# Patient Record
Sex: Female | Born: 1955 | Race: White | Hispanic: No | Marital: Married | Smoking: Former smoker
Health system: Southern US, Community
[De-identification: ages and names within clinical notes are randomized; demographics above are authoritative.]

## PROBLEM LIST (undated history)

## (undated) DIAGNOSIS — K219 Gastro-esophageal reflux disease without esophagitis: Secondary | ICD-10-CM

## (undated) DIAGNOSIS — I998 Other disorder of circulatory system: Secondary | ICD-10-CM

## (undated) DIAGNOSIS — I447 Left bundle-branch block, unspecified: Secondary | ICD-10-CM

## (undated) DIAGNOSIS — I429 Cardiomyopathy, unspecified: Secondary | ICD-10-CM

## (undated) DIAGNOSIS — I2510 Atherosclerotic heart disease of native coronary artery without angina pectoris: Secondary | ICD-10-CM

## (undated) DIAGNOSIS — I10 Essential (primary) hypertension: Secondary | ICD-10-CM

## (undated) DIAGNOSIS — E785 Hyperlipidemia, unspecified: Secondary | ICD-10-CM

## (undated) HISTORY — DX: Essential (primary) hypertension: I10

## (undated) HISTORY — DX: Atherosclerotic heart disease of native coronary artery without angina pectoris: I25.10

## (undated) HISTORY — DX: Other disorder of circulatory system: I99.8

## (undated) HISTORY — DX: Left bundle-branch block, unspecified: I44.7

## (undated) HISTORY — DX: Cardiomyopathy, unspecified: I42.9

## (undated) HISTORY — DX: Gastro-esophageal reflux disease without esophagitis: K21.9

## (undated) HISTORY — DX: Hyperlipidemia, unspecified: E78.5

---

## 1986-05-15 HISTORY — PX: TUBAL LIGATION: SHX77

## 2006-05-15 HISTORY — PX: FINGER SURGERY: SHX640

## 2008-05-15 HISTORY — PX: CERVICAL POLYPECTOMY: SHX88

## 2012-05-15 HISTORY — PX: SHOULDER SURGERY: SHX246

## 2013-09-19 ENCOUNTER — Ambulatory Visit: Payer: Self-pay | Admitting: Family Medicine

## 2013-10-02 ENCOUNTER — Encounter (INDEPENDENT_AMBULATORY_CARE_PROVIDER_SITE_OTHER): Payer: Self-pay

## 2013-10-02 ENCOUNTER — Ambulatory Visit (INDEPENDENT_AMBULATORY_CARE_PROVIDER_SITE_OTHER): Payer: 59 | Admitting: Family Medicine

## 2013-10-02 ENCOUNTER — Encounter: Payer: Self-pay | Admitting: Family Medicine

## 2013-10-02 VITALS — BP 138/82 | HR 74 | Temp 98.0°F | Ht 64.75 in | Wt 199.8 lb

## 2013-10-02 DIAGNOSIS — E785 Hyperlipidemia, unspecified: Secondary | ICD-10-CM

## 2013-10-02 DIAGNOSIS — I2510 Atherosclerotic heart disease of native coronary artery without angina pectoris: Secondary | ICD-10-CM

## 2013-10-02 DIAGNOSIS — I10 Essential (primary) hypertension: Secondary | ICD-10-CM

## 2013-10-02 NOTE — Progress Notes (Signed)
   Subjective:    Patient ID: Veronica Gomez, female    DOB: 06-28-1955, 58 y.o.   MRN: [id number]  HPI This 58 y.o. female presents for evaluation of establishment visit.  She has moved to  [place] from [place]. She has CAD and has been seeing cardiology.  She has no coronary stents And she has hx of LBBB.  She has hx of GERD, hypertension, and hyperlipidemia.  She states she has been told by her cardiologist that she had a small AMI.   Marland Kitchen   Review of Systems C/o allergies No chest pain, SOB, HA, dizziness, vision change, N/V, diarrhea, constipation, dysuria, urinary urgency or frequency, myalgias, arthralgias or rash.     Objective:   Physical Exam Vital signs noted  Well developed well nourished female.  HEENT - Head atraumatic Normocephalic                Eyes - PERRLA, Conjuctiva - clear Sclera- Clear EOMI                Ears - EAC's Wnl TM's Wnl Gross Hearing WNL                Throat - oropharanx wnl Respiratory - Lungs CTA bilateral Cardiac - RRR S1 and S2 without murmur GI - Abdomen soft Nontender and bowel sounds active x 4 Extremities - No edema. Neuro - Grossly intact.       Assessment & Plan:  Other and unspecified hyperlipidemia - Plan: POCT CBC, CMP14+EGFR, Lipid panel, Thyroid Panel With TSH, Vit D  25 hydroxy (rtn osteoporosis monitoring)  CAD (coronary artery disease) - Plan: Ambulatory referral to Cardiology  Essential hypertension, benign - Plan: POCT CBC, CMP14+EGFR  Allergies - stay away from decongestants and take zyrtec [place] FNP

## 2013-10-03 ENCOUNTER — Other Ambulatory Visit (INDEPENDENT_AMBULATORY_CARE_PROVIDER_SITE_OTHER): Payer: 59

## 2013-10-03 DIAGNOSIS — I10 Essential (primary) hypertension: Secondary | ICD-10-CM

## 2013-10-03 DIAGNOSIS — E785 Hyperlipidemia, unspecified: Secondary | ICD-10-CM

## 2013-10-03 LAB — POCT CBC
Granulocyte percent: 2.4 %G — AB (ref 37–80)
HCT, POC: 42.2 % (ref 37.7–47.9)
Hemoglobin: 14 g/dL (ref 12.2–16.2)
Lymph, poc: 1.5 (ref 0.6–3.4)
MCH, POC: 32.7 pg — AB (ref 27–31.2)
MCHC: 33.2 g/dL (ref 31.8–35.4)
MCV: 98.4 fL — AB (ref 80–97)
MPV: 8.2 fL (ref 0–99.8)
POC LYMPH PERCENT: 37.8 %L (ref 10–50)
Platelet Count, POC: 231 10*3/uL (ref 142–424)
RBC: 4.3 M/uL (ref 4.04–5.48)
RDW, POC: 12.1 %
WBC: 4.1 10*3/uL — AB (ref 4.6–10.2)

## 2013-10-03 NOTE — Progress Notes (Signed)
Pt came in for lab  only 

## 2013-10-04 LAB — CMP14+EGFR
ALT: 13 IU/L (ref 0–32)
AST: 15 IU/L (ref 0–40)
Albumin/Globulin Ratio: 1.9 (ref 1.1–2.5)
Albumin: 4.3 g/dL (ref 3.6–4.8)
Alkaline Phosphatase: 55 IU/L (ref 39–117)
BUN/Creatinine Ratio: 15 (ref 11–26)
BUN: 16 mg/dL (ref 8–27)
CO2: 23 mmol/L (ref 18–29)
Calcium: 9.1 mg/dL (ref 8.7–10.3)
Chloride: 100 mmol/L (ref 97–108)
Creatinine, Ser: 1.04 mg/dL — ABNORMAL HIGH (ref 0.57–1.00)
GFR calc Af Amer: 67 mL/min/{1.73_m2} (ref >59)
GFR calc non Af Amer: 59 mL/min/{1.73_m2} — ABNORMAL LOW (ref >59)
Globulin, Total: 2.3 g/dL (ref 1.5–4.5)
Glucose: 97 mg/dL (ref 65–99)
Potassium: 4.1 mmol/L (ref 3.5–5.2)
Sodium: 140 mmol/L (ref 134–144)
Total Bilirubin: 0.4 mg/dL (ref 0.0–1.2)
Total Protein: 6.6 g/dL (ref 6.0–8.5)

## 2013-10-04 LAB — THYROID PANEL WITH TSH
Free Thyroxine Index: 2.2 (ref 1.2–4.9)
T3 Uptake Ratio: 29 % (ref 24–39)
T4, Total: 7.7 ug/dL (ref 4.5–12.0)
TSH: 1.88 u[IU]/mL (ref 0.450–4.500)

## 2013-10-04 LAB — LIPID PANEL
Chol/HDL Ratio: 3.4 ratio units (ref 0.0–4.4)
Cholesterol, Total: 162 mg/dL (ref 100–199)
HDL: 48 mg/dL (ref >39)
LDL Calculated: 99 mg/dL (ref 0–99)
Triglycerides: 77 mg/dL (ref 0–149)
VLDL Cholesterol Cal: 15 mg/dL (ref 5–40)

## 2013-10-04 LAB — VITAMIN D 25 HYDROXY (VIT D DEFICIENCY, FRACTURES): Vit D, 25-Hydroxy: 28.4 ng/mL — ABNORMAL LOW (ref 30.0–100.0)

## 2013-10-07 ENCOUNTER — Other Ambulatory Visit: Payer: Self-pay | Admitting: Family Medicine

## 2013-10-07 MED ORDER — VITAMIN D (ERGOCALCIFEROL) 1.25 MG (50000 UNIT) PO CAPS: 50000.0000 [IU] | ORAL_CAPSULE | ORAL | Status: DC

## 2013-10-08 ENCOUNTER — Telehealth: Payer: Self-pay | Admitting: Family Medicine

## 2013-10-08 NOTE — Telephone Encounter (Signed)
Message copied by Azalee Course on Wed Oct 08, 2013 11:02 AM ------      Message from: Deatra Canter      Created: Tue Oct 07, 2013 11:42 AM       Vit D is low and rx will be sent to pharm ------

## 2013-10-16 ENCOUNTER — Encounter: Payer: Self-pay | Admitting: Family Medicine

## 2013-11-20 ENCOUNTER — Encounter: Payer: Self-pay | Admitting: Internal Medicine

## 2013-11-20 ENCOUNTER — Ambulatory Visit (INDEPENDENT_AMBULATORY_CARE_PROVIDER_SITE_OTHER): Payer: 59 | Admitting: Internal Medicine

## 2013-11-20 VITALS — BP 138/90 | HR 80 | Ht 64.0 in | Wt 194.1 lb

## 2013-11-20 DIAGNOSIS — I10 Essential (primary) hypertension: Secondary | ICD-10-CM | POA: Insufficient documentation

## 2013-11-20 DIAGNOSIS — I2589 Other forms of chronic ischemic heart disease: Secondary | ICD-10-CM

## 2013-11-20 DIAGNOSIS — I447 Left bundle-branch block, unspecified: Secondary | ICD-10-CM | POA: Insufficient documentation

## 2013-11-20 DIAGNOSIS — E785 Hyperlipidemia, unspecified: Secondary | ICD-10-CM | POA: Insufficient documentation

## 2013-11-20 DIAGNOSIS — I255 Ischemic cardiomyopathy: Secondary | ICD-10-CM

## 2013-11-20 MED ORDER — CARVEDILOL 3.125 MG PO TABS: 3.1250 mg | ORAL_TABLET | Freq: Two times a day (BID) | ORAL | Status: DC

## 2013-11-20 NOTE — Patient Instructions (Signed)
Your physician wants you to follow-up in:  6 months. You will receive a reminder letter in the mail two months in advance. If you don't receive a letter, please call our office to schedule the follow-up appointment.   

## 2013-11-20 NOTE — Progress Notes (Signed)
OFFICE NOTE  Chief Complaint:  Establish new cardiologist  Primary Care Physician: Deatra Canter, FNP  HPI:  Veronica Gomez is a pleasant 58 year old female who moved to [place] in January of this year. Her past echo history is significant for hypertension, dyslipidemia and a former smoker, but quit over 25 years ago. 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 In 2009 she was noted to have an interventricular conduction delay, and was referred for stress test which was negative for ischemia. Records indicate 2013 she was having some chest discomfort and had a new, clear left bundle branch block. She underwent a nuclear stress test shows a fixed inferoapical defect. EF was 56%. An echocardiogram performed around the same time showed an EF of 40% with apical inferior wall hypokinesis. This was determined to be scar, no further workup was recommended as her symptoms were not thought to be due to ischemia. No ischemia was noted on her stress test. She is on appropriate medical therapy including aspirin, statin, ACE inhibitor, beta blocker and diuretic. She is quite active around her house and into your and denies any chest pain or worsening shortness of breath. Her weight has been stable. Her last office visit with her cardiologist was in December 2014 no changes to her medical regimen were recommended at that time. She did have a recent lipid profile which showed total cholesterol 162 and LDL of 99.  PMHx:  Past Medical History  Diagnosis Date  . Hypertension   . Hyperlipidemia   . GERD (gastroesophageal reflux disease)     Past Surgical History  Procedure Laterality Date  . Shoulder surgery Right   . Uterine fibroid surgery    . Tubal ligation      FAMHx:  Family History  Problem Relation Age of Onset  . Osteoporosis Mother   . Asthma Mother   . Hypertension Mother   . Hypertension Father   . Heart disease Father     MI    SOCHx:   reports that she quit smoking about 25 years ago. She has never used smokeless tobacco. She reports that she drinks alcohol. She reports that she does not use illicit drugs.  ALLERGIES:  No Known Allergies  ROS: A comprehensive review of systems was negative except for: Musculoskeletal: positive for arthralgias  HOME MEDS: Current Outpatient Prescriptions  Medication Sig Dispense Refill  . aspirin 81 MG tablet Take 81 mg by mouth daily.      Marland Kitchen atorvastatin (LIPITOR) 20 MG tablet Take 20 mg by mouth daily.      . carvedilol (COREG) 3.125 MG tablet Take 3.125 mg by mouth 2 (two) times daily with a meal.      . hydrochlorothiazide (HYDRODIURIL) 25 MG tablet Take 25 mg by mouth daily.      Marland Kitchen lisinopril (PRINIVIL,ZESTRIL) 20 MG tablet Take 20 mg by mouth daily.      . Omega-3 Fatty Acids (FISH OIL) 1200 MG CAPS Take 1 capsule by mouth daily.      Marland Kitchen omeprazole (PRILOSEC) 20 MG capsule Take 20 mg by mouth daily.      . Vitamin D, Ergocalciferol, (DRISDOL) 50000 UNITS CAPS capsule Take 1 capsule (50,000 Units total) by mouth every 7 (seven) days.  18 capsule  0   No current facility-administered medications for this visit.    LABS/IMAGING: No results found for this or any previous visit (from the past 48 hour(s)). No results  found.  VITALS: BP 138/90  Pulse 80  Ht 5\' 4"  (1.626 m)  Wt 194 lb 1.6 oz (88.043 kg)  BMI 33.30 kg/m2  EXAM: General appearance: alert and no distress Neck: no carotid bruit and no JVD Lungs: clear to auscultation bilaterally Heart: regular rate and rhythm, S1, S2 normal, no murmur, click, rub or gallop Abdomen: soft, non-tender; bowel sounds normal; no masses,  no organomegaly Extremities: extremities normal, atraumatic, no cyanosis or edema Pulses: 2+ and symmetric Skin: Skin color, texture, turgor normal. No rashes or lesions Neurologic: Grossly normal Psych: Mood, affect normal  EKG: Normal sinus rhythm at 80, left axis deviation and left bundle branch  block, QRS duration 160 msec  ASSESSMENT: 1. Ischemic cardiomyopathy, EF between 40 and 50% 2. Fixed inferoapical defect suggestive of scar on prior nuclear stress testing 3. Chronic LBBB 4. Hypertension 5. Dyslipidemia 6. Former smoker  PLAN: 1.   Mrs. Veronica Gomez is here to establish cardiac care. It seems that she is developed a left bundle branch block probably due to ischemia and had a prior MI in the inferoapical area. She does not recall symptoms of this event. Her nuclear stress test was nonischemic and therefore she has never undergone cardiac catheterization. She continues to be asymptomatic and denies chest pain or shortness of breath. She is on appropriate medical therapy. Her hypertension is fairly well controlled. Her cholesterol is the same however LDL could be better below 70. There may be room to increase her Lipitor in the future. I recommend she continue work on activity, diet and exercise. Plan to see her back in 6 months.  Thank you for the kind referral.  Chrystie NoseKenneth C. Hazelle Woollard, MD, [facility] Attending Cardiologist [facility]  Khaliq Turay C 11/20/2013, 3:08 PM

## 2014-02-03 ENCOUNTER — Encounter: Payer: Self-pay | Admitting: Family Medicine

## 2014-02-03 ENCOUNTER — Ambulatory Visit (INDEPENDENT_AMBULATORY_CARE_PROVIDER_SITE_OTHER): Payer: 59 | Admitting: Family Medicine

## 2014-02-03 VITALS — BP 140/86 | HR 65 | Temp 97.6°F | Ht 64.75 in | Wt 195.6 lb

## 2014-02-03 DIAGNOSIS — I10 Essential (primary) hypertension: Secondary | ICD-10-CM

## 2014-02-03 DIAGNOSIS — E559 Vitamin D deficiency, unspecified: Secondary | ICD-10-CM

## 2014-02-03 DIAGNOSIS — R5383 Other fatigue: Secondary | ICD-10-CM

## 2014-02-03 DIAGNOSIS — E785 Hyperlipidemia, unspecified: Secondary | ICD-10-CM

## 2014-02-03 DIAGNOSIS — R5381 Other malaise: Secondary | ICD-10-CM

## 2014-02-03 NOTE — Progress Notes (Signed)
   Subjective:    Patient ID: Veronica Gomez, female    DOB: [birth year], 58 y.o.   MRN: [id number]  HPI  Patient is here for follow up.  She has hx of HTN, CAD, and hyperlipidemia.  She was seen 6/15 and her labs were ok. She is seeing cardiology.  She has problems with arthritis.  She denies any acute c/o.  Review of Systems    No chest pain, SOB, HA, dizziness, vision change, N/V, diarrhea, constipation, dysuria, urinary urgency or frequency, myalgias, arthralgias or rash.  Objective:   Physical Exam Vital signs noted  Well developed well nourished female.  HEENT - Head atraumatic Normocephalic                Eyes - PERRLA, Conjuctiva - clear Sclera- Clear EOMI                Ears - EAC's Wnl TM's Wnl Gross Hearing WNL                Nose - Nares patent                 Throat - oropharanx wnl Respiratory - Lungs CTA bilateral Cardiac - RRR S1 and S2 without murmur GI - Abdomen soft Nontender and bowel sounds active x 4 Extremities - No edema. Neuro - Grossly intact.       Assessment & Plan:  Hyperlipemia - Plan: CANCELED: CMP14+EGFR, CANCELED: Lipid panel  Essential hypertension, benign - Plan: CANCELED: CMP14+EGFR  Vitamin D deficiency - Plan: CANCELED: Vit D  25 hydroxy (rtn osteoporosis monitoring)  Other fatigue - Plan: CANCELED: POCT CBC  Arthritis - mobic $Remov'15mg'[id number]$  one po qd  Check labs next visit in 6 months

## 2014-04-07 ENCOUNTER — Encounter: Payer: Self-pay | Admitting: *Deleted

## 2014-04-15 ENCOUNTER — Other Ambulatory Visit: Payer: Self-pay | Admitting: Family Medicine

## 2014-04-15 ENCOUNTER — Telehealth: Payer: Self-pay | Admitting: Family Medicine

## 2014-04-15 MED ORDER — HYDROCHLOROTHIAZIDE 25 MG PO TABS: 25.0000 mg | ORAL_TABLET | Freq: Every day | ORAL | Status: DC

## 2014-04-15 NOTE — Telephone Encounter (Signed)
hctz sent to pharmacy

## 2014-04-15 NOTE — Telephone Encounter (Signed)
Pt wants refills on HCTZ 25 mg but I can't see that we have ever prescribed it for her. Is it ok to send refills to pharmacy? Please advise.

## 2014-04-16 NOTE — Telephone Encounter (Signed)
Pt aware.

## 2014-05-13 ENCOUNTER — Other Ambulatory Visit: Payer: Self-pay

## 2014-05-13 MED ORDER — ATORVASTATIN CALCIUM 20 MG PO TABS: 20.0000 mg | ORAL_TABLET | Freq: Every day | ORAL | Status: DC

## 2014-05-25 ENCOUNTER — Telehealth: Payer: Self-pay | Admitting: Internal Medicine

## 2014-05-25 ENCOUNTER — Telehealth: Payer: Self-pay | Admitting: Cardiology

## 2014-05-25 NOTE — Telephone Encounter (Signed)
Wrong provider ° °Close encounter °

## 2014-05-26 NOTE — Telephone Encounter (Signed)
Close encounter 

## 2014-06-09 ENCOUNTER — Ambulatory Visit: Payer: 59 | Admitting: Internal Medicine

## 2014-06-18 ENCOUNTER — Ambulatory Visit (INDEPENDENT_AMBULATORY_CARE_PROVIDER_SITE_OTHER): Payer: 59 | Admitting: Internal Medicine

## 2014-06-18 ENCOUNTER — Encounter: Payer: Self-pay | Admitting: Internal Medicine

## 2014-06-18 VITALS — BP 136/88 | HR 80 | Ht 65.0 in | Wt 198.7 lb

## 2014-06-18 DIAGNOSIS — I255 Ischemic cardiomyopathy: Secondary | ICD-10-CM

## 2014-06-18 DIAGNOSIS — I10 Essential (primary) hypertension: Secondary | ICD-10-CM

## 2014-06-18 DIAGNOSIS — I447 Left bundle-branch block, unspecified: Secondary | ICD-10-CM

## 2014-06-18 DIAGNOSIS — E785 Hyperlipidemia, unspecified: Secondary | ICD-10-CM

## 2014-06-18 MED ORDER — EZETIMIBE 10 MG PO TABS: 10.0000 mg | ORAL_TABLET | Freq: Every day | ORAL | Status: DC

## 2014-06-18 NOTE — Progress Notes (Signed)
OFFICE NOTE  Chief Complaint:  Routine follow-up  Primary Care Physician: Frederica KusterMILLER, STEPHEN M, MD  HPI:  Veronica Gomez is a pleasant 59 year old female who moved to [place] in January of this year. Her past echo history is significant for hypertension, dyslipidemia and a former smoker, but quit over 25 years ago. 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 In 2009 she was noted to have an interventricular conduction delay, and was referred for stress test which was negative for ischemia. Records indicate 2013 she was having some chest discomfort and had a new, clear left bundle branch block. She underwent a nuclear stress test shows a fixed inferoapical defect. EF was 56%. An echocardiogram performed around the same time showed an EF of 40% with apical inferior wall hypokinesis. This was determined to be scar, no further workup was recommended as her symptoms were not thought to be due to ischemia. No ischemia was noted on her stress test. She is on appropriate medical therapy including aspirin, statin, ACE inhibitor, beta blocker and diuretic. She is quite active around her house and into your and denies any chest pain or worsening shortness of breath. Her weight has been stable. Her last office visit with her cardiologist was in December 2014 no changes to her medical regimen were recommended at that time. She did have a recent lipid profile which showed total cholesterol 162 and LDL of 99.  I saw [name] back in the office today. She has no new complaints. We recently did lipid testing which shows her LDL cholesterol to be 99. That is still higher than her goal LDL of less than 70. We talked about possibly adjusting her Lipitor up however she reported she had side effects with that in the past.  PMHx:  Past Medical History  Diagnosis Date  . Hypertension   . Hyperlipidemia   . GERD (gastroesophageal reflux disease)     Past Surgical History    Procedure Laterality Date  . Shoulder surgery Right   . Uterine fibroid surgery    . Tubal ligation      FAMHx:  Family History  Problem Relation Age of Onset  . Osteoporosis Mother   . Asthma Mother   . Hypertension Mother   . Hypertension Father   . Heart disease Father     MI    SOCHx:   reports that she quit smoking about 26 years ago. She has never used smokeless tobacco. She reports that she drinks alcohol. She reports that she does not use illicit drugs.  ALLERGIES:  No Known Allergies  ROS: A comprehensive review of systems was negative except for: Musculoskeletal: positive for arthralgias  HOME MEDS: Current Outpatient Prescriptions  Medication Sig Dispense Refill  . aspirin 81 MG tablet Take 81 mg by mouth daily.    Marland Kitchen. atorvastatin (LIPITOR) 20 MG tablet Take 1 tablet (20 mg total) by mouth daily. 90 tablet 0  . carvedilol (COREG) 3.125 MG tablet Take 1 tablet (3.125 mg total) by mouth 2 (two) times daily with a meal. 60 tablet 6  . hydrochlorothiazide (HYDRODIURIL) 25 MG tablet Take 1 tablet (25 mg total) by mouth daily. 90 tablet 3  . lisinopril (PRINIVIL,ZESTRIL) 20 MG tablet Take 20 mg by mouth daily.    Marland Kitchen. omeprazole (PRILOSEC) 20 MG capsule Take 20 mg by mouth daily.    Marland Kitchen. ezetimibe (ZETIA) 10 MG tablet Take 1 tablet (10 mg total) by mouth daily.  21 tablet 0   No current facility-administered medications for this visit.    LABS/IMAGING: No results found for this or any previous visit (from the past 48 hour(s)). No results found.  VITALS: BP 136/88 mmHg  Pulse 80  Ht  (1.651 m)  Wt 198 lb 11.2 oz (90.13 kg)  BMI 33.07 kg/m2  EXAM: General appearance: alert and no distress Neck: no carotid bruit and no JVD Lungs: clear to auscultation bilaterally Heart: regular rate and rhythm, S1, S2 normal, no murmur, click, rub or gallop Abdomen: soft, non-tender; bowel sounds normal; no masses,  no organomegaly Extremities: extremities normal, atraumatic,  no cyanosis or edema Pulses: 2+ and symmetric Skin: Skin color, texture, turgor normal. No rashes or lesions Neurologic: Grossly normal Psych: Mood, affect normal  EKG: Normal sinus rhythm at 80, left axis deviation and left bundle branch block, QRS duration 158 msec  ASSESSMENT: 1. Ischemic cardiomyopathy, EF between 40 and 50% 2. Fixed inferoapical defect suggestive of scar on prior nuclear stress testing 3. Chronic LBBB 4. Hypertension 5. Dyslipidemia 6. Former smoker  PLAN: 1.   Veronica Gomez is continuing to do well. Of encouraged exercise and work on weight loss. Blood pressure is well controlled. Cholesterol could be improved. I recommend rechecking a lipid profile and if her LDL remains greater than 70, starting her on Zetia 10 milligrams daily in addition to her Lipitor. Hopefully this will help [place] avoid the myalgias she had with higher dose Lipitor. Plan to see her back in 6 months.  Chrystie Nose, MD, [facility] Attending Cardiologist [facility]  HILTY,Kenneth C 06/18/2014, 5:32 PM

## 2014-06-18 NOTE — Patient Instructions (Signed)
Your physician recommends that you return for lab work FASTING  We will call you with the results - Dr. Rennis GoldenHilty may want to start you on zetia   Your physician wants you to follow-up in: 6 months with Dr. Rennis GoldenHilty. You will receive a reminder letter in the mail two months in advance. If you don't receive a letter, please call our office to schedule the follow-up appointment.

## 2014-06-22 ENCOUNTER — Other Ambulatory Visit (INDEPENDENT_AMBULATORY_CARE_PROVIDER_SITE_OTHER): Payer: 59

## 2014-06-22 DIAGNOSIS — E785 Hyperlipidemia, unspecified: Secondary | ICD-10-CM

## 2014-06-22 NOTE — Progress Notes (Signed)
Lab only for Dr Chrystie NoseKenneth C Hilty

## 2014-06-23 LAB — LIPID PANEL
CHOL/HDL RATIO: 2.9 ratio (ref 0.0–4.4)
Cholesterol, Total: 149 mg/dL (ref 100–199)
HDL: 51 mg/dL (ref >39)
LDL CALC: 86 mg/dL (ref 0–99)
Triglycerides: 62 mg/dL (ref 0–149)
VLDL Cholesterol Cal: 12 mg/dL (ref 5–40)

## 2014-06-26 ENCOUNTER — Encounter: Payer: Self-pay | Admitting: *Deleted

## 2014-06-30 ENCOUNTER — Other Ambulatory Visit: Payer: Self-pay | Admitting: *Deleted

## 2014-06-30 MED ORDER — LISINOPRIL 20 MG PO TABS: 20.0000 mg | ORAL_TABLET | Freq: Every day | ORAL | Status: DC

## 2014-08-04 ENCOUNTER — Ambulatory Visit: Payer: 59 | Admitting: Family Medicine

## 2014-08-05 ENCOUNTER — Ambulatory Visit (INDEPENDENT_AMBULATORY_CARE_PROVIDER_SITE_OTHER): Payer: 59 | Admitting: Family Medicine

## 2014-08-05 ENCOUNTER — Encounter: Payer: Self-pay | Admitting: Family Medicine

## 2014-08-05 VITALS — BP 132/90 | HR 71 | Temp 97.0°F | Ht 65.0 in | Wt 194.0 lb

## 2014-08-05 DIAGNOSIS — E785 Hyperlipidemia, unspecified: Secondary | ICD-10-CM

## 2014-08-05 DIAGNOSIS — I10 Essential (primary) hypertension: Secondary | ICD-10-CM

## 2014-08-05 MED ORDER — ATORVASTATIN CALCIUM 20 MG PO TABS: 40.0000 mg | ORAL_TABLET | ORAL | Status: DC

## 2014-08-05 NOTE — Patient Instructions (Signed)
Check on price of Tdap with insurance company  Hypertension Hypertension, commonly called high blood pressure, is when the force of blood pumping through your arteries is too strong. Your arteries are the blood vessels that carry blood from your heart throughout your body. A blood pressure reading consists of a higher number over a lower number, such as 110/72. The higher number (systolic) is the pressure inside your arteries when your heart pumps. The lower number (diastolic) is the pressure inside your arteries when your heart relaxes. Ideally you want your blood pressure below 120/80. Hypertension forces your heart to work harder to pump blood. Your arteries may become narrow or stiff. Having hypertension puts you at risk for heart disease, stroke, and other problems.  RISK FACTORS Some risk factors for high blood pressure are controllable. Others are not.  Risk factors you cannot control include:   Race. You may be at higher risk if you are African American.  Age. Risk increases with age.  Gender. Men are at higher risk than women before age 61 years. After age 61, women are at higher risk than men. Risk factors you can control include:  Not getting enough exercise or physical activity.  Being overweight.  Getting too much fat, sugar, calories, or salt in your diet.  Drinking too much alcohol. SIGNS AND SYMPTOMS Hypertension does not usually cause signs or symptoms. Extremely high blood pressure (hypertensive crisis) may cause headache, anxiety, shortness of breath, and nosebleed. DIAGNOSIS  To check if you have hypertension, your health care provider will measure your blood pressure while you are seated, with your arm held at the level of your heart. It should be measured at least twice using the same arm. Certain conditions can cause a difference in blood pressure between your right and left arms. A blood pressure reading that is higher than normal on one occasion does not mean that  you need treatment. If one blood pressure reading is high, ask your health care provider about having it checked again. TREATMENT  Treating high blood pressure includes making lifestyle changes and possibly taking medicine. Living a healthy lifestyle can help lower high blood pressure. You may need to change some of your habits. Lifestyle changes may include:  Following the DASH diet. This diet is high in fruits, vegetables, and whole grains. It is low in salt, red meat, and added sugars.  Getting at least 2 hours of brisk physical activity every week.  Losing weight if necessary.  Not smoking.  Limiting alcoholic beverages.  Learning ways to reduce stress. If lifestyle changes are not enough to get your blood pressure under control, your health care provider may prescribe medicine. You may need to take more than one. Work closely with your health care provider to understand the risks and benefits. HOME CARE INSTRUCTIONS  Have your blood pressure rechecked as directed by your health care provider.   Take medicines only as directed by your health care provider. Follow the directions carefully. Blood pressure medicines must be taken as prescribed. The medicine does not work as well when you skip doses. Skipping doses also puts you at risk for problems.   Do not smoke.   Monitor your blood pressure at home as directed by your health care provider. SEEK MEDICAL CARE IF:   You think you are having a reaction to medicines taken.  You have recurrent headaches or feel dizzy.  You have swelling in your ankles.  You have trouble with your vision. SEEK IMMEDIATE MEDICAL CARE  IF:  You develop a severe headache or confusion.  You have unusual weakness, numbness, or feel faint.  You have severe chest or abdominal pain.  You vomit repeatedly.  You have trouble breathing. MAKE SURE YOU:   Understand these instructions.  Will watch your condition.  Will get help right away if  you are not doing well or get worse. Document Released: 05/01/2005 Document Revised: 09/15/2013 Document Reviewed: 02/21/2013 [facility] Patient Information 2015 [place], [place]. This information is not intended to replace advice given to you by your health care provider. Make sure you discuss any questions you have with your health care provider.

## 2014-08-05 NOTE — Progress Notes (Signed)
   Subjective:    Patient ID: Veronica Gomez, female    DOB: 04-15-1956, 59 y.o.   MRN: [id number]  HPI 59 year old female here to follow-up hypertension. She saw her cardiologist last month who was concerned that her LDL was above 70 and consider starting Zetia but that has not happened yet. We talked about her LDL. She did not tolerate higher doses of a atorvastatin previously but I suggested we might increase it from 20-40 mg and take it every other day and she is agreeable to this.  Her husband has concerns about her memory. Brief testing of her memory today with 3 word recall at 5 minutes was normal and I do not see any issues here currently.  Patient Active Problem List   Diagnosis Date Noted  . HTN (hypertension) 11/20/2013  . Dyslipidemia 11/20/2013  . Cardiomyopathy, ischemic 11/20/2013  . LBBB (left bundle branch block) 11/20/2013   Outpatient Encounter Prescriptions as of 08/05/2014  Medication Sig  . aspirin 81 MG tablet Take 81 mg by mouth daily.  Marland Kitchen atorvastatin (LIPITOR) 20 MG tablet Take 1 tablet (20 mg total) by mouth daily.  . carvedilol (COREG) 3.125 MG tablet Take 1 tablet (3.125 mg total) by mouth 2 (two) times daily with a meal.  . hydrochlorothiazide (HYDRODIURIL) 25 MG tablet Take 1 tablet (25 mg total) by mouth daily.  Marland Kitchen lisinopril (PRINIVIL,ZESTRIL) 20 MG tablet Take 1 tablet (20 mg total) by mouth daily.  Marland Kitchen omeprazole (PRILOSEC) 20 MG capsule Take 20 mg by mouth daily.  . [DISCONTINUED] ezetimibe (ZETIA) 10 MG tablet Take 1 tablet (10 mg total) by mouth daily.      Review of Systems  Constitutional: Negative.   HENT: Negative.   Eyes: Negative.   Respiratory: Negative.   Cardiovascular: Negative.   Gastrointestinal: Negative.   Endocrine: Negative.   Genitourinary: Negative.   Hematological: Negative.   Psychiatric/Behavioral: Negative.        Objective:   Physical Exam  Constitutional: She is oriented to person, place, and time. She appears  well-developed and well-nourished.  Eyes: Conjunctivae and EOM are normal.  Neck: Normal range of motion. Neck supple.  Cardiovascular: Normal rate, regular rhythm and normal heart sounds.   Pulmonary/Chest: Effort normal and breath sounds normal.  Abdominal: Soft. Bowel sounds are normal.  Musculoskeletal: Normal range of motion.  Neurological: She is alert and oriented to person, place, and time. She has normal reflexes.  Skin: Skin is warm and dry.  Psychiatric: She has a normal mood and affect. Her behavior is normal. Thought content normal.    BP 132/90 mmHg  Pulse 71  Temp(Src) 97 F (36.1 C) (Oral)  Ht _0  (1.651 m)  Wt 194 lb (87.998 kg)  BMI 32.28 kg/m2        Assessment & Plan:  1. Essential hypertension Blood pressure well controlled on current regimen of carvedilol and lisinopril and hydrochlorothiazide  2. Dyslipidemia Increase atorvastatin to 40 mg every other day and repeat the lipids in 2 months. If she cannot tolerate atorvastatin with this regimen and Zetia 10 mg - Lipid panel; Future - CMP14+EGFR; Future  Wardell Honour MD

## 2014-08-08 ENCOUNTER — Other Ambulatory Visit: Payer: Self-pay | Admitting: Family Medicine

## 2014-08-11 ENCOUNTER — Other Ambulatory Visit: Payer: Self-pay | Admitting: Family Medicine

## 2014-09-14 ENCOUNTER — Ambulatory Visit (INDEPENDENT_AMBULATORY_CARE_PROVIDER_SITE_OTHER): Payer: 59 | Admitting: Obstetrics & Gynecology

## 2014-09-14 ENCOUNTER — Encounter: Payer: Self-pay | Admitting: Obstetrics & Gynecology

## 2014-09-14 VITALS — BP 159/99 | HR 69 | Resp 16 | Ht 65.0 in | Wt 195.0 lb

## 2014-09-14 DIAGNOSIS — Z124 Encounter for screening for malignant neoplasm of cervix: Secondary | ICD-10-CM | POA: Diagnosis not present

## 2014-09-14 DIAGNOSIS — Z01419 Encounter for gynecological examination (general) (routine) without abnormal findings: Secondary | ICD-10-CM | POA: Diagnosis not present

## 2014-09-14 DIAGNOSIS — Z1151 Encounter for screening for human papillomavirus (HPV): Secondary | ICD-10-CM

## 2014-09-14 DIAGNOSIS — E663 Overweight: Secondary | ICD-10-CM | POA: Insufficient documentation

## 2014-09-14 DIAGNOSIS — E669 Obesity, unspecified: Secondary | ICD-10-CM

## 2014-09-14 NOTE — Progress Notes (Signed)
Patient ID: Veronica SpeakBeverly Kingma, female   DOB: March 12, 1956, 59 y.o.   MRN: [id number]  Chief Complaint  Patient presents with  . Gynecologic Exam    HPI Veronica Gomez is a 59 y.o. female.  [id number] Patient's last menstrual period was 09/13/2008 (approximate).   HPI  Past Medical History  Diagnosis Date  . Hypertension   . Hyperlipidemia   . GERD (gastroesophageal reflux disease)   . Cardiomyopathy   . Ischemia   . Left bundle branch block (LBBB)   . CAD (coronary artery disease)     Past Surgical History  Procedure Laterality Date  . Shoulder surgery Right 2014  . Tubal ligation  1988  . Finger surgery  2008  . Cervical polypectomy  2010    Family History  Problem Relation Age of Onset  . Osteoporosis Mother   . Asthma Mother   . Hypertension Mother   . Hypertension Father   . Heart disease Father     MI    Social History History  Substance Use Topics  . Smoking status: Former Smoker    Quit date: 05/15/1988  . Smokeless tobacco: Never Used  . Alcohol Use: Yes     Comment: occasionally - beer    No Known Allergies  Current Outpatient Prescriptions  Medication Sig Dispense Refill  . aspirin 81 MG tablet Take 81 mg by mouth daily.    Marland Kitchen. atorvastatin (LIPITOR) 20 MG tablet     . carvedilol (COREG) 3.125 MG tablet Take 1 tablet (3.125 mg total) by mouth 2 (two) times daily with a meal. 60 tablet 6  . hydrochlorothiazide (HYDRODIURIL) 25 MG tablet Take 1 tablet (25 mg total) by mouth daily. 90 tablet 3  . lisinopril (PRINIVIL,ZESTRIL) 20 MG tablet Take 1 tablet (20 mg total) by mouth daily. 90 tablet 0  . omeprazole (PRILOSEC) 20 MG capsule Take 20 mg by mouth daily.     No current facility-administered medications for this visit.   OB History    Gravida Para Term Preterm AB TAB SAB Ectopic Multiple Living   3 3 3       2       Review of Systems Review of Systems  Constitutional: Negative.   Respiratory: Negative.   Gastrointestinal: Negative.    Genitourinary: Negative.     Blood pressure 159/99, pulse 69, resp. rate 16, height 5\' 5"  (1.651 m), weight 195 lb (88.451 kg), last menstrual period 09/13/2008.  Physical Exam Physical Exam  Constitutional: She appears well-developed. No distress.  Obese, BMI 33.6 noted  Neck: JVD: office notes.  No JVD, charting error  Cardiovascular: Normal rate.   Pulmonary/Chest: Effort normal. No respiratory distress.  Breasts: breasts appear normal, no suspicious masses, no skin or nipple changes or axillary nodes.   Abdominal: Soft. She exhibits no distension and no mass. There is no tenderness.  Genitourinary: Vagina normal and uterus normal. No vaginal discharge found.  Pelvic exam: normal external genitalia, vulva, vagina, cervix, uterus and adnexa, pap done.     Data Reviewed   Assessment    Weel woman exam with no problems, pap done, mammogram up-to-date     Plan    Year mammogram and breast exam, pap in 3-5 years, none past age 59 if nl        ARNOLD,JAMES 09/14/2014, 10:42 AM

## 2014-09-14 NOTE — Patient Instructions (Signed)
Mammography Mammography is an X-ray of the breasts to look for changes that are not normal. The X-ray image is called a mammogram. This procedure can screen for breast cancer, can detect cancer early, and can diagnose cancer.  LET YOUR CAREGIVER KNOW ABOUT:  Breast implants.  Previous breast disease, biopsy, or surgery.  If you are breastfeeding.  Medicines taken, including vitamins, herbs, eyedrops, over-the-counter medicines, and creams.  Use of steroids (by mouth or creams).  Possibility of pregnancy, if this applies. RISKS AND COMPLICATIONS  Exposure to radiation, but at very low levels.  The results may be misinterpreted.  The results may not be accurate.  Mammography may lead to further tests.  Mammography may not catch certain cancers. BEFORE THE PROCEDURE  Schedule your test about 7 days after your menstrual period. This is when your breasts are the least tender and have signs of hormone changes.  If you have had a mammography done at a different facility in the past, get the mammogram X-rays or have them sent to your current exam facility in order to compare them.  Wash your breasts and under your arms the day of the test.  Do not wear deodorants, perfumes, or powders anywhere on your body.  Wear clothes that you can change in and out of easily. PROCEDURE Relax as much as possible during the test. Any discomfort during the test will be very brief. The test should take less than 30 minutes. The following will happen:  You will undress from the waist up and put on a gown.  You will stand in front of the X-ray machine.  Each breast will be placed between 2 plastic or glass plates. The plates will compress your breast for a few seconds.  X-rays will be taken from different angles of the breast. AFTER THE PROCEDURE  The mammogram will be examined.  Depending on the quality of the images, you may need to repeat certain parts of the test.  Ask when your test  results will be ready. Make sure you get your test results.  You may resume normal activities. Document Released: 04/28/2000 Document Revised: 07/24/2011 Document Reviewed: 02/19/2011 Exit[employer] Patient Information 2015 [employer]. This information is not intended to replace advice given to you by your health care provider. Make sure you discuss any questions you have with your health care provider.  

## 2014-09-17 LAB — CYTOLOGY - PAP

## 2014-09-21 ENCOUNTER — Encounter: Payer: Self-pay | Admitting: *Deleted

## 2014-09-23 ENCOUNTER — Telehealth: Payer: Self-pay | Admitting: Internal Medicine

## 2014-09-24 ENCOUNTER — Other Ambulatory Visit: Payer: Self-pay | Admitting: Internal Medicine

## 2014-09-24 NOTE — Telephone Encounter (Signed)
Close encounter 

## 2014-09-25 ENCOUNTER — Other Ambulatory Visit: Payer: Self-pay | Admitting: *Deleted

## 2014-09-25 MED ORDER — CARVEDILOL 3.125 MG PO TABS: 3.1250 mg | ORAL_TABLET | Freq: Two times a day (BID) | ORAL | Status: DC

## 2014-09-27 ENCOUNTER — Other Ambulatory Visit: Payer: Self-pay | Admitting: Family Medicine

## 2014-10-08 ENCOUNTER — Encounter: Payer: Self-pay | Admitting: Internal Medicine

## 2014-11-11 ENCOUNTER — Other Ambulatory Visit: Payer: Self-pay | Admitting: Family Medicine

## 2014-12-05 ENCOUNTER — Other Ambulatory Visit: Payer: Self-pay | Admitting: Family Medicine

## 2014-12-21 ENCOUNTER — Ambulatory Visit: Payer: 59 | Admitting: Internal Medicine

## 2014-12-25 ENCOUNTER — Ambulatory Visit (INDEPENDENT_AMBULATORY_CARE_PROVIDER_SITE_OTHER): Payer: 59 | Admitting: Internal Medicine

## 2014-12-25 ENCOUNTER — Encounter: Payer: Self-pay | Admitting: Internal Medicine

## 2014-12-25 VITALS — BP 122/84 | HR 66 | Ht 65.0 in | Wt 190.7 lb

## 2014-12-25 DIAGNOSIS — E785 Hyperlipidemia, unspecified: Secondary | ICD-10-CM | POA: Diagnosis not present

## 2014-12-25 DIAGNOSIS — I255 Ischemic cardiomyopathy: Secondary | ICD-10-CM | POA: Diagnosis not present

## 2014-12-25 DIAGNOSIS — I447 Left bundle-branch block, unspecified: Secondary | ICD-10-CM

## 2014-12-25 DIAGNOSIS — I10 Essential (primary) hypertension: Secondary | ICD-10-CM

## 2014-12-25 NOTE — Patient Instructions (Signed)
Your physician recommends that you return for lab work FASTING to check cholesterol   Your physician has requested that you have an echocardiogram in 1 year @ [address]. Echocardiography is a painless test that uses sound waves to create images of your heart. It provides your doctor with information about the size and shape of your heart and how well your heart's chambers and valves are working. This procedure takes approximately one hour. There are no restrictions for this procedure.  Your physician wants you to follow-up in: 1 year with Dr. Rennis Golden - after your echo. You will receive a reminder letter in the mail two months in advance. If you don't receive a letter, please call our office to schedule the follow-up appointment.

## 2014-12-25 NOTE — Progress Notes (Signed)
OFFICE NOTE  Chief Complaint:  Routine follow-up  Primary Care Physician: Frederica Kuster, MD  HPI:  Veronica Gomez is a pleasant 59 year old female who moved to [place] in January of this year. Her past echo history is significant for hypertension, dyslipidemia and a former smoker, but quit over 25 years ago. 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 In 2009 she was noted to have an interventricular conduction delay, and was referred for stress test which was negative for ischemia. Records indicate 2013 she was having some chest discomfort and had a new, clear left bundle branch block. She underwent a nuclear stress test shows a fixed inferoapical defect. EF was 56%. An echocardiogram performed around the same time showed an EF of 40% with apical inferior wall hypokinesis. This was determined to be scar, no further workup was recommended as her symptoms were not thought to be due to ischemia. No ischemia was noted on her stress test. She is on appropriate medical therapy including aspirin, statin, ACE inhibitor, beta blocker and diuretic. She is quite active around her house and into your and denies any chest pain or worsening shortness of breath. Her weight has been stable. Her last office visit with her cardiologist was in December 2014 no changes to her medical regimen were recommended at that time. She did have a recent lipid profile which showed total cholesterol 162 and LDL of 99.  I saw Tae back in the office today. She has no new complaints. We recently did lipid testing which shows her LDL cholesterol to be 99. That is still higher than her goal LDL of less than 70. We talked about possibly adjusting her Lipitor up however she reported she had side effects with that in the past.  Veronica Gomez returns today for follow-up. Overall she is feeling well. She denies any worsening shortness of breath or chest pain. Blood pressure has been very well  controlled. Recently she had discussed medicine changes which were planning based on her cholesterol not being quite at goal. She was concerned about myalgias on higher dose statin and therefore I recommended adding Avandia. Alternatively her primary care provider suggested she could take a double dose of Lipitor every other day. She's been doing that for at least several months and is due for recheck of her cholesterol. She currently denies any myalgias or myopathy  PMHx:  Past Medical History  Diagnosis Date  . Hypertension   . Hyperlipidemia   . GERD (gastroesophageal reflux disease)   . Cardiomyopathy   . Ischemia   . Left bundle branch block (LBBB)   . CAD (coronary artery disease)     Past Surgical History  Procedure Laterality Date  . Shoulder surgery Right 2014  . Tubal ligation  1988  . Finger surgery  2008  . Cervical polypectomy  2010    FAMHx:  Family History  Problem Relation Age of Onset  . Osteoporosis Mother   . Asthma Mother   . Hypertension Mother   . Hypertension Father   . Heart disease Father     MI    SOCHx:   reports that she quit smoking about 26 years ago. She has never used smokeless tobacco. She reports that she drinks alcohol. She reports that she does not use illicit drugs.  ALLERGIES:  No Known Allergies  ROS: A comprehensive review of systems was negative.  HOME MEDS: Current Outpatient Prescriptions  Medication Sig Dispense  Refill  . aspirin 81 MG tablet Take 81 mg by mouth daily.    Marland Kitchen atorvastatin (LIPITOR) 20 MG tablet Take 40 mg by mouth every other day.    . carvedilol (COREG) 3.125 MG tablet Take 1 tablet (3.125 mg total) by mouth 2 (two) times daily with a meal. 60 tablet 6  . hydrochlorothiazide (HYDRODIURIL) 25 MG tablet Take 1 tablet (25 mg total) by mouth daily. 90 tablet 3  . lisinopril (PRINIVIL,ZESTRIL) 20 MG tablet TAKE ONE TABLET BY MOUTH ONE TIME DAILY 90 tablet 0  . omeprazole (PRILOSEC) 20 MG capsule Take 20 mg by  mouth daily.     No current facility-administered medications for this visit.    LABS/IMAGING: No results found for this or any previous visit (from the past 48 hour(s)). No results found.  VITALS: BP 122/84 mmHg  Pulse 66  Ht 5\' 5"  (1.651 m)  Wt 190 lb 11.2 oz (86.501 kg)  BMI 31.73 kg/m2  LMP 09/13/2008 (Approximate)  EXAM: General appearance: alert and no distress Neck: no carotid bruit and no JVD Lungs: clear to auscultation bilaterally Heart: regular rate and rhythm, S1, S2 normal, no murmur, click, rub or gallop Abdomen: soft, non-tender; bowel sounds normal; no masses,  no organomegaly Extremities: extremities normal, atraumatic, no cyanosis or edema Pulses: 2+ and symmetric Skin: Skin color, texture, turgor normal. No rashes or lesions Neurologic: Grossly normal Psych: Mood, affect normal  EKG: Normal sinus rhythm at 66, left axis deviation and left bundle branch block, QRS duration 158 msec  ASSESSMENT: 1. Ischemic cardiomyopathy, EF between 40 and 50% 2. Fixed inferoapical defect suggestive of scar on prior nuclear stress testing 3. Chronic LBBB 4. Hypertension 5. Dyslipidemia 6. Former smoker  PLAN: 1.   Veronica Gomez is doing well from a symptomatic standpoint. She is on appropriate medications for heart failure and there is little room to up titrate those at this point. She is due for recheck of her cholesterol especially with her new dosing regimen. Will adjust her medications accordingly. I like to see her back in one year which time will check her echocardiogram prior to that visit. She certainly can contact me if she needs me anytime before that visit.  Veronica Nose, MD, [facility] Attending Cardiologist [facility]  [facility] 12/25/2014, 1:15 PM

## 2014-12-27 ENCOUNTER — Other Ambulatory Visit: Payer: Self-pay | Admitting: Family Medicine

## 2014-12-31 ENCOUNTER — Other Ambulatory Visit (INDEPENDENT_AMBULATORY_CARE_PROVIDER_SITE_OTHER): Payer: 59

## 2014-12-31 DIAGNOSIS — E785 Hyperlipidemia, unspecified: Secondary | ICD-10-CM | POA: Diagnosis not present

## 2014-12-31 NOTE — Progress Notes (Signed)
Lab only 

## 2015-01-01 LAB — CMP14+EGFR
ALBUMIN: 4.1 g/dL (ref 3.6–4.8)
ALK PHOS: 55 IU/L (ref 39–117)
ALT: 18 IU/L (ref 0–32)
AST: 16 IU/L (ref 0–40)
Albumin/Globulin Ratio: 1.5 (ref 1.1–2.5)
BUN / CREAT RATIO: 16 (ref 11–26)
BUN: 16 mg/dL (ref 8–27)
Bilirubin Total: 0.3 mg/dL (ref 0.0–1.2)
CALCIUM: 9.2 mg/dL (ref 8.7–10.3)
CO2: 23 mmol/L (ref 18–29)
CREATININE: 1.02 mg/dL — AB (ref 0.57–1.00)
Chloride: 102 mmol/L (ref 97–108)
GFR calc Af Amer: 69 mL/min/{1.73_m2} (ref >59)
GFR, EST NON AFRICAN AMERICAN: 60 mL/min/{1.73_m2} (ref >59)
GLOBULIN, TOTAL: 2.8 g/dL (ref 1.5–4.5)
GLUCOSE: 90 mg/dL (ref 65–99)
Potassium: 4.6 mmol/L (ref 3.5–5.2)
Sodium: 141 mmol/L (ref 134–144)
TOTAL PROTEIN: 6.9 g/dL (ref 6.0–8.5)

## 2015-01-01 LAB — LIPID PANEL
CHOLESTEROL TOTAL: 159 mg/dL (ref 100–199)
Chol/HDL Ratio: 2.8 ratio units (ref 0.0–4.4)
HDL: 56 mg/dL (ref >39)
LDL Calculated: 89 mg/dL (ref 0–99)
Triglycerides: 72 mg/dL (ref 0–149)
VLDL Cholesterol Cal: 14 mg/dL (ref 5–40)

## 2015-01-06 ENCOUNTER — Telehealth: Payer: Self-pay | Admitting: *Deleted

## 2015-01-06 NOTE — Telephone Encounter (Signed)
Patient had lab work at PCP office. PCP office notified patient of lipid results (ordered by Dr. Rennis Golden)  Message     Labs stable - cholesterol is at goal. No changes. Please notify patient.        ----- Message -----     From: Prescott Gum     Sent: 01/01/2015  8:06 AM      To: Chrystie Nose, MD    No further action necessary.

## 2015-02-14 ENCOUNTER — Other Ambulatory Visit: Payer: Self-pay | Admitting: Family Medicine

## 2015-04-04 ENCOUNTER — Other Ambulatory Visit: Payer: Self-pay | Admitting: Family Medicine

## 2015-04-20 ENCOUNTER — Ambulatory Visit (INDEPENDENT_AMBULATORY_CARE_PROVIDER_SITE_OTHER): Payer: 59 | Admitting: Family

## 2015-04-20 ENCOUNTER — Encounter: Payer: Self-pay | Admitting: Family

## 2015-04-20 VITALS — BP 127/90 | HR 70 | Temp 97.5°F | Ht 65.0 in | Wt 193.4 lb

## 2015-04-20 DIAGNOSIS — B372 Candidiasis of skin and nail: Secondary | ICD-10-CM

## 2015-04-20 MED ORDER — NYSTATIN 100000 UNIT/GM EX POWD: CUTANEOUS | Status: DC

## 2015-04-20 MED ORDER — NYSTATIN-TRIAMCINOLONE 100000-0.1 UNIT/GM-% EX OINT: 1.0000 "application " | TOPICAL_OINTMENT | Freq: Two times a day (BID) | CUTANEOUS | Status: DC

## 2015-04-20 NOTE — Patient Instructions (Signed)
Cutaneous Candidiasis °Cutaneous candidiasis is a condition in which there is an overgrowth of yeast (candida) on the skin. Yeast normally live on the skin, but in small enough numbers not to cause any symptoms. In certain cases, increased growth of the yeast may cause an actual yeast infection. This kind of infection usually occurs in areas of the skin that are constantly warm and moist, such as the armpits or the groin. Yeast is the most common cause of diaper rash in babies and in people who cannot control their bowel movements (incontinence). °CAUSES  °The fungus that most often causes cutaneous candidiasis is Candida albicans. Conditions that can increase the risk of getting a yeast infection of the skin include: °· Obesity. °· Pregnancy. °· Diabetes. °· Taking antibiotic medicine. °· Taking birth control pills. °· Taking steroid medicines. °· Thyroid disease. °· An iron or zinc deficiency. °· Problems with the immune system. °SYMPTOMS  °· Red, swollen area of the skin. °· Bumps on the skin. °· Itchiness. °DIAGNOSIS  °The diagnosis of cutaneous candidiasis is usually based on its appearance. Light scrapings of the skin may also be taken and viewed under a microscope to identify the presence of yeast. °TREATMENT  °Antifungal creams may be applied to the infected skin. In severe cases, oral medicines may be needed.  °HOME CARE INSTRUCTIONS  °· Keep your skin clean and dry. °· Maintain a healthy weight. °· If you have diabetes, keep your blood sugar under control. °SEEK IMMEDIATE MEDICAL CARE IF: °· Your rash continues to spread despite treatment. °· You have a fever, chills, or abdominal pain. °  °This information is not intended to replace advice given to you by your health care provider. Make sure you discuss any questions you have with your health care provider. °  °Document Released: 01/17/2011 Document Revised: 07/24/2011 Document Reviewed: 11/02/2014 °Elsevier Interactive Patient Education ©2016 Elsevier  Inc[employer] ° °

## 2015-04-20 NOTE — Progress Notes (Signed)
   Subjective:    Patient ID: Veronica Gomez, female    DOB: 02/15/1956, 59 y.o.   MRN: [id number]  Rash This is a new problem. The current episode started 1 to 4 weeks ago. The problem has been waxing and waning since onset. Location: bilateral breast. The rash is characterized by burning, pain and redness. Pertinent negatives include no congestion, diarrhea, fatigue, joint pain, rhinorrhea, shortness of breath or sore throat. Past treatments include nothing. The treatment provided mild relief.      Review of Systems  Constitutional: Negative.  Negative for fatigue.  HENT: Negative.  Negative for congestion, rhinorrhea and sore throat.   Eyes: Negative.   Respiratory: Negative.  Negative for shortness of breath.   Cardiovascular: Negative.  Negative for palpitations.  Gastrointestinal: Negative.  Negative for diarrhea.  Endocrine: Negative.   Genitourinary: Negative.   Musculoskeletal: Negative.  Negative for joint pain.  Skin: Positive for rash.  Neurological: Negative.  Negative for headaches.  Hematological: Negative.   Psychiatric/Behavioral: Negative.   All other systems reviewed and are negative.      Objective:   Physical Exam  Constitutional: She is oriented to person, place, and time. She appears well-developed and well-nourished. No distress.  HENT:  Head: Normocephalic and atraumatic.  Eyes: Pupils are equal, round, and reactive to light.  Neck: Normal range of motion. Neck supple. No thyromegaly present.  Cardiovascular: Normal rate, regular rhythm, normal heart sounds and intact distal pulses.   No murmur heard. Pulmonary/Chest: Effort normal and breath sounds normal. No respiratory distress. She has no wheezes.  Abdominal: Soft. Bowel sounds are normal. She exhibits no distension. There is no tenderness.  Musculoskeletal: Normal range of motion. She exhibits no edema or tenderness.  Neurological: She is alert and oriented to person, place, and time. She has normal  reflexes. No cranial nerve deficit.  Skin: Skin is warm and dry. There is erythema (erythemas rash under bilateral breasts).  Psychiatric: She has a normal mood and affect. Her behavior is normal. Judgment and thought content normal.  Vitals reviewed.   BP 142/95 mmHg  Pulse 75  Temp(Src) 97.5 F (36.4 C) (Oral)  Ht 5\' 5"  (1.651 m)  Wt 193 lb 6.4 oz (87.726 kg)  BMI 32.18 kg/m2  LMP 09/13/2008 (Approximate)       Assessment & Plan:  1. Yeast infection of the skin -Keep clean and dry -Do not scratch  -RTO prn  - nystatin (MYCOSTATIN/NYSTOP) 100000 UNIT/GM POWD; Apply BID to area  Dispense: 30 g; Refill: 1 - nystatin-triamcinolone ointment (MYCOLOG); Apply 1 application topically 2 (two) times daily.  Dispense: 30 g; Refill: 0  Jannifer Rodneyhristy Hawks, FNP

## 2015-04-24 ENCOUNTER — Other Ambulatory Visit: Payer: Self-pay | Admitting: Family Medicine

## 2015-04-27 ENCOUNTER — Other Ambulatory Visit: Payer: Self-pay

## 2015-04-27 MED ORDER — HYDROCHLOROTHIAZIDE 25 MG PO TABS: 25.0000 mg | ORAL_TABLET | Freq: Every day | ORAL | Status: DC

## 2015-05-04 ENCOUNTER — Telehealth: Payer: Self-pay

## 2015-05-04 MED ORDER — NYSTATIN 100000 UNIT/GM EX CREA: 1.0000 "application " | TOPICAL_CREAM | Freq: Two times a day (BID) | CUTANEOUS | Status: DC

## 2015-05-04 MED ORDER — TRIAMCINOLONE ACETONIDE 0.5 % EX OINT: 1.0000 "application " | TOPICAL_OINTMENT | Freq: Two times a day (BID) | CUTANEOUS | Status: DC

## 2015-05-04 NOTE — Telephone Encounter (Signed)
Insurance denied prior authorization for Nystatin/Triamcinolone  Must try Nystatin cream or ointment used in combination with triamcinolone 0.1% cream or ointment (generic Kenalog)

## 2015-05-04 NOTE — Telephone Encounter (Signed)
Prescription sent to pharmacy.

## 2015-05-05 ENCOUNTER — Other Ambulatory Visit: Payer: Self-pay | Admitting: Internal Medicine

## 2015-05-05 NOTE — Telephone Encounter (Signed)
Veronica NoseKenneth C Hilty, MD at 12/25/2014 1:15 PM  carvedilol (COREG) 3.125 MG tabletTake 1 tablet (3.125 mg total) by mouth 2 (two) times daily with a meal PLAN: 1. Veronica Gomez is doing well from a symptomatic standpoint. She is on appropriate medications for heart failure and there is little room to up titrate those at this point. She is due for recheck of her cholesterol especially with her new dosing regimen. Will adjust her medications accordingly. I like to see her back in one year which time will check her echocardiogram prior to that visit. She certainly can contact me if she needs me anytime before that visit.

## 2015-06-05 ENCOUNTER — Other Ambulatory Visit: Payer: Self-pay | Admitting: Family Medicine

## 2015-06-16 ENCOUNTER — Other Ambulatory Visit: Payer: Self-pay | Admitting: Internal Medicine

## 2015-06-16 ENCOUNTER — Other Ambulatory Visit: Payer: Self-pay | Admitting: Family Medicine

## 2015-06-16 MED ORDER — CARVEDILOL 3.125 MG PO TABS: 3.1250 mg | ORAL_TABLET | Freq: Two times a day (BID) | ORAL | Status: DC

## 2015-06-16 MED ORDER — ATORVASTATIN CALCIUM 20 MG PO TABS: 20.0000 mg | ORAL_TABLET | Freq: Every day | ORAL | Status: DC

## 2015-06-16 MED ORDER — LISINOPRIL 20 MG PO TABS: ORAL_TABLET | ORAL | Status: DC

## 2015-06-16 MED ORDER — HYDROCHLOROTHIAZIDE 25 MG PO TABS: ORAL_TABLET | ORAL | Status: DC

## 2015-06-16 NOTE — Telephone Encounter (Signed)
°*  STAT* If patient is at the pharmacy, call can be transferred to refill team.   1. Which medications need to be refilled? (please list name of each medication and dose if known) Carvediol -needs a new prescription sent   2. Which pharmacy/location (including street and city if local pharmacy) is medication to be sent to?[facility] **Fax number [phone]  3. Do they need a 30 day or 90 day supply? 90 with 3 refills

## 2015-06-16 NOTE — Telephone Encounter (Signed)
done

## 2015-06-16 NOTE — Telephone Encounter (Signed)
Rx(s) sent to pharmacy electronically.  

## 2015-06-22 ENCOUNTER — Encounter: Payer: Self-pay | Admitting: Internal Medicine

## 2015-06-22 ENCOUNTER — Ambulatory Visit (INDEPENDENT_AMBULATORY_CARE_PROVIDER_SITE_OTHER): Payer: BLUE CROSS/BLUE SHIELD | Admitting: Internal Medicine

## 2015-06-22 VITALS — BP 130/80 | HR 72 | Ht 64.5 in | Wt 197.4 lb

## 2015-06-22 DIAGNOSIS — I447 Left bundle-branch block, unspecified: Secondary | ICD-10-CM

## 2015-06-22 DIAGNOSIS — I255 Ischemic cardiomyopathy: Secondary | ICD-10-CM | POA: Diagnosis not present

## 2015-06-22 DIAGNOSIS — E785 Hyperlipidemia, unspecified: Secondary | ICD-10-CM

## 2015-06-22 DIAGNOSIS — I10 Essential (primary) hypertension: Secondary | ICD-10-CM

## 2015-06-22 MED ORDER — CARVEDILOL 6.25 MG PO TABS: 6.2500 mg | ORAL_TABLET | Freq: Two times a day (BID) | ORAL | Status: DC

## 2015-06-22 NOTE — Progress Notes (Signed)
OFFICE NOTE  Chief Complaint:  Routine follow-up  Primary Care Physician: Frederica Kuster, MD  HPI:  Veronica Gomez is a pleasant 60 year old female who moved to [place] in January of this year. Her past echo history is significant for hypertension, dyslipidemia and a former smoker, but quit over 25 years ago. 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 In 2009 she was noted to have an interventricular conduction delay, and was referred for stress test which was negative for ischemia. Records indicate 2013 she was having some chest discomfort and had a new, clear left bundle branch block. She underwent a nuclear stress test shows a fixed inferoapical defect. EF was 56%. An echocardiogram performed around the same time showed an EF of 40% with apical inferior wall hypokinesis. This was determined to be scar, no further workup was recommended as her symptoms were not thought to be due to ischemia. No ischemia was noted on her stress test. She is on appropriate medical therapy including aspirin, statin, ACE inhibitor, beta blocker and diuretic. She is quite active around her house and into your and denies any chest pain or worsening shortness of breath. Her weight has been stable. Her last office visit with her cardiologist was in December 2014 no changes to her medical regimen were recommended at that time. She did have a recent lipid profile which showed total cholesterol 162 and LDL of 99.  I saw Darcey back in the office today. She has no new complaints. We recently did lipid testing which shows her LDL cholesterol to be 99. That is still higher than her goal LDL of less than 70. We talked about possibly adjusting her Lipitor up however she reported she had side effects with that in the past.  [name] returns today for follow-up. Overall she is feeling well. She denies any worsening shortness of breath or chest pain. Blood pressure has been very well  controlled. Recently she had discussed medicine changes which were planning based on her cholesterol not being quite at goal. She was concerned about myalgias on higher dose statin and therefore I recommended adding Avandia. Alternatively her primary care provider suggested she could take a double dose of Lipitor every other day. She's been doing that for at least several months and is due for recheck of her cholesterol. She currently denies any myalgias or myopathy  I had the pleasure seeing [name] back today in the office. Slightly earlier than our normal follow-up appointment. She's changed insurance companies and is interested in starting some more exercise. She denies any chest pain but does get short of breath with activity. She was due for repeat echocardiogram which I like to get to see if she's had any change in her LV function. She is currently on aspirin, Lipitor, carvedilol and lisinopril.  PMHx:  Past Medical History  Diagnosis Date  . Hypertension   . Hyperlipidemia   . GERD (gastroesophageal reflux disease)   . Cardiomyopathy (HCC)   . Ischemia   . Left bundle branch block (LBBB)   . CAD (coronary artery disease)     Past Surgical History  Procedure Laterality Date  . Shoulder surgery Right 2014  . Tubal ligation  1988  . Finger surgery  2008  . Cervical polypectomy  2010    FAMHx:  Family History  Problem Relation Age of Onset  . Osteoporosis Mother   . Asthma Mother   . Hypertension Mother   .  Hypertension Father   . Heart disease Father     MI    SOCHx:   reports that she quit smoking about 27 years ago. She has never used smokeless tobacco. She reports that she drinks alcohol. She reports that she does not use illicit drugs.  ALLERGIES:  No Known Allergies  ROS: A comprehensive review of systems was negative.  HOME MEDS: Current Outpatient Prescriptions  Medication Sig Dispense Refill  . aspirin 81 MG tablet Take 81 mg by mouth daily.    Marland Kitchen  atorvastatin (LIPITOR) 20 MG tablet Take 1 tablet (20 mg total) by mouth daily. 90 tablet 0  . carvedilol (COREG) 6.25 MG tablet Take 1 tablet (6.25 mg total) by mouth 2 (two) times daily with a meal. 180 tablet 3  . hydrochlorothiazide (HYDRODIURIL) 25 MG tablet TAKE 1 TABLET (25 MG TOTAL) BY MOUTH DAILY. 90 tablet 0  . lisinopril (PRINIVIL,ZESTRIL) 20 MG tablet TAKE ONE TABLET BY MOUTH ONE  TIME DAILY 90 tablet 0  . omeprazole (PRILOSEC) 20 MG capsule Take 20 mg by mouth daily.     No current facility-administered medications for this visit.    LABS/IMAGING: No results found for this or any previous visit (from the past 48 hour(s)). No results found.  VITALS: BP 130/80 mmHg  Pulse 72  Ht 5' 4.5" (1.638 m)  Wt 197 lb 7 oz (89.557 kg)  BMI 33.38 kg/m2  LMP 09/13/2008 (Approximate)  EXAM: General appearance: alert and no distress Neck: no carotid bruit and no JVD Lungs: clear to auscultation bilaterally Heart: regular rate and rhythm, S1, S2 normal, no murmur, click, rub or gallop Abdomen: soft, non-tender; bowel sounds normal; no masses,  no organomegaly Extremities: extremities normal, atraumatic, no cyanosis or edema Pulses: 2+ and symmetric Skin: Skin color, texture, turgor normal. No rashes or lesions Neurologic: Grossly normal Psych: Mood, affect normal  EKG: Normal sinus rhythm at 72, left bundle branch block  ASSESSMENT: 1. Ischemic cardiomyopathy, EF between 40 and 50% 2. Fixed inferoapical defect suggestive of scar on prior nuclear stress testing 3. Chronic LBBB 4. Hypertension 5. Dyslipidemia 6. Former smoker  PLAN: 1.   [name] get some mild shortness of breath with exertion but she is also gain some weight. She is interested in more exercise and I like to get a repeat echocardiogram prior to that to make sure that her LV function has not worsened. In addition, I think there is room to increase her medications. She is on low-dose carvedilol and should  tolerate an increase in that to 6.25 mg twice daily. I asked her watch her symptoms as she does have a left bundle branch block and we do not want to worsen that delay. If her echo shows stable if not improved LV function, then she could start a low to moderate intensity exercise program and work her way up.  Follow-up with me in 6 months.  Chrystie Nose, MD, [facility] Attending Cardiologist [facility]  Chrystie Nose 06/22/2015, 4:55 PM

## 2015-06-22 NOTE — Patient Instructions (Signed)
Dr Rennis Golden has recommended making the following medication changes: INCREASE Carvedilol (Coreg) to 6.25 mg  **Please schedule the echocardiogram TODAY!!  Dr Rennis Golden recommends that you schedule a follow-up appointment in 6 months. You will receive a reminder letter in the mail two months in advance. If you don't receive a letter, please call our office to schedule the follow-up appointment.  If you need a refill on your cardiac medications before your next appointment, please call your pharmacy.

## 2015-07-08 ENCOUNTER — Ambulatory Visit (HOSPITAL_COMMUNITY): Payer: BLUE CROSS/BLUE SHIELD | Attending: Cardiovascular Disease

## 2015-07-08 ENCOUNTER — Other Ambulatory Visit: Payer: Self-pay

## 2015-07-08 DIAGNOSIS — I119 Hypertensive heart disease without heart failure: Secondary | ICD-10-CM | POA: Diagnosis not present

## 2015-07-08 DIAGNOSIS — E785 Hyperlipidemia, unspecified: Secondary | ICD-10-CM | POA: Insufficient documentation

## 2015-07-08 DIAGNOSIS — E669 Obesity, unspecified: Secondary | ICD-10-CM | POA: Diagnosis not present

## 2015-07-08 DIAGNOSIS — Z87891 Personal history of nicotine dependence: Secondary | ICD-10-CM | POA: Diagnosis not present

## 2015-07-08 DIAGNOSIS — I447 Left bundle-branch block, unspecified: Secondary | ICD-10-CM | POA: Insufficient documentation

## 2015-07-08 DIAGNOSIS — Z6833 Body mass index (BMI) 33.0-33.9, adult: Secondary | ICD-10-CM | POA: Insufficient documentation

## 2015-07-08 DIAGNOSIS — R29898 Other symptoms and signs involving the musculoskeletal system: Secondary | ICD-10-CM | POA: Diagnosis not present

## 2015-07-08 DIAGNOSIS — I340 Nonrheumatic mitral (valve) insufficiency: Secondary | ICD-10-CM | POA: Diagnosis not present

## 2015-07-08 DIAGNOSIS — I255 Ischemic cardiomyopathy: Secondary | ICD-10-CM | POA: Insufficient documentation

## 2015-07-14 ENCOUNTER — Encounter: Payer: Self-pay | Admitting: *Deleted

## 2015-08-26 ENCOUNTER — Other Ambulatory Visit: Payer: Self-pay | Admitting: Family Medicine

## 2015-10-25 ENCOUNTER — Ambulatory Visit: Payer: BLUE CROSS/BLUE SHIELD | Admitting: Family Medicine

## 2015-11-08 ENCOUNTER — Encounter: Payer: Self-pay | Admitting: Family Medicine

## 2015-11-08 ENCOUNTER — Ambulatory Visit (INDEPENDENT_AMBULATORY_CARE_PROVIDER_SITE_OTHER): Payer: BLUE CROSS/BLUE SHIELD | Admitting: Family Medicine

## 2015-11-08 VITALS — BP 144/103 | HR 75 | Temp 97.3°F | Ht 64.5 in | Wt 187.0 lb

## 2015-11-08 DIAGNOSIS — E785 Hyperlipidemia, unspecified: Secondary | ICD-10-CM | POA: Diagnosis not present

## 2015-11-08 DIAGNOSIS — I10 Essential (primary) hypertension: Secondary | ICD-10-CM

## 2015-11-08 DIAGNOSIS — E559 Vitamin D deficiency, unspecified: Secondary | ICD-10-CM | POA: Diagnosis not present

## 2015-11-08 MED ORDER — ATORVASTATIN CALCIUM 20 MG PO TABS: ORAL_TABLET | ORAL | Status: DC

## 2015-11-08 MED ORDER — LISINOPRIL 20 MG PO TABS: ORAL_TABLET | ORAL | Status: DC

## 2015-11-08 NOTE — Patient Instructions (Signed)
Continue current medications. Continue good therapeutic lifestyle changes which include good diet and exercise. Fall precautions discussed with patient. If an FOBT was given today- please return it to our front desk. If you are over 62 years old - you may need Prevnar 13 or the adult Pneumonia vaccine.   After your visit with us today you will receive a survey in the mail or online from [employer] regarding your care with us. Please take a moment to fill this out. Your feedback is very important to us as you can help us better understand your patient needs as well as improve your experience and satisfaction. WE CARE ABOUT YOU!!!    

## 2015-11-08 NOTE — Progress Notes (Signed)
   Subjective:    Patient ID: Veronica Gomez, female    DOB: Jan 11, 1956, 60 y.o.   MRN: [id number]  HPI Pt here for follow up and management of chronic medical problems which includes hyperlipidemia and hypertension. She is taking medications regularly.  She also sees cardiologist. There is a history of coronary artery disease and left bundle branch block. She has had no chest pain. Blood pressures have generally been well controlled but she was in the emergency room last night with a neighbor and did not get home until early this morning and her blood pressure is elevated. I have asked her to keep a check on that and let me know if it does not come down.     Patient Active Problem List   Diagnosis Date Noted  . Obesity (BMI 30.0-34.9) 09/14/2014  . HTN (hypertension) 11/20/2013  . Dyslipidemia 11/20/2013  . Cardiomyopathy, ischemic 11/20/2013  . LBBB (left bundle branch block) 11/20/2013   Outpatient Encounter Prescriptions as of 11/08/2015  Medication Sig  . aspirin 81 MG tablet Take 81 mg by mouth daily.  Marland Kitchen atorvastatin (LIPITOR) 20 MG tablet TAKE 1 BY MOUTH DAILY  . carvedilol (COREG) 6.25 MG tablet Take 1 tablet (6.25 mg total) by mouth 2 (two) times daily with a meal.  . hydrochlorothiazide (HYDRODIURIL) 25 MG tablet TAKE 1 BY MOUTH DAILY  . lisinopril (PRINIVIL,ZESTRIL) 20 MG tablet TAKE 1 BY MOUTH ONE TIME DAILY  . omeprazole (PRILOSEC) 20 MG capsule Take 20 mg by mouth daily.  . [DISCONTINUED] atorvastatin (LIPITOR) 20 MG tablet TAKE 1 BY MOUTH DAILY  . [DISCONTINUED] lisinopril (PRINIVIL,ZESTRIL) 20 MG tablet TAKE 1 BY MOUTH ONE TIME DAILY   No facility-administered encounter medications on file as of 11/08/2015.     Review of Systems  Constitutional: Negative.   HENT: Negative.   Eyes: Negative.   Respiratory: Negative.   Cardiovascular: Negative.   Gastrointestinal: Negative.   Endocrine: Negative.   Genitourinary: Negative.   Musculoskeletal: Negative.   Skin:  Negative.   Allergic/Immunologic: Negative.   Neurological: Negative.   Hematological: Negative.   Psychiatric/Behavioral: Negative.        Objective:   Physical Exam  Constitutional: She is oriented to person, place, and time. She appears well-developed and well-nourished.  Cardiovascular: Normal rate, normal heart sounds and intact distal pulses.   Pulmonary/Chest: Effort normal and breath sounds normal.  Neurological: She is alert and oriented to person, place, and time.  Psychiatric: She has a normal mood and affect. Her behavior is normal.   BP 144/103 mmHg  Pulse 75  Temp(Src) 97.3 F (36.3 C) (Oral)  Ht 5' 4.5" (1.638 m)  Wt 187 lb (84.823 kg)  BMI 31.61 kg/m2  LMP 09/13/2008 (Approximate)        Assessment & Plan:  1. Essential hypertension Patient to monitor blood pressure at home. Her goal is 135/85. She will call back in 2 weeks with a record of her blood pressure - CMP14+EGFR  2. Hyperlipemia Lipids were at goal when last checked 11 months ago. There are no issues with atorvastatin - Lipid panel  3. Vitamin D deficiency I'm in D level was low slightly low when last checked 2 years ago  Wardell Honour MD

## 2015-11-09 ENCOUNTER — Other Ambulatory Visit: Payer: Self-pay | Admitting: Family

## 2015-11-09 LAB — LIPID PANEL
CHOL/HDL RATIO: 3.4 ratio (ref 0.0–4.4)
Cholesterol, Total: 171 mg/dL (ref 100–199)
HDL: 51 mg/dL (ref >39)
LDL Calculated: 92 mg/dL (ref 0–99)
Triglycerides: 140 mg/dL (ref 0–149)
VLDL CHOLESTEROL CAL: 28 mg/dL (ref 5–40)

## 2015-11-09 LAB — CMP14+EGFR
A/G RATIO: 1.7 (ref 1.2–2.2)
ALBUMIN: 4.4 g/dL (ref 3.6–4.8)
ALT: 18 IU/L (ref 0–32)
AST: 15 IU/L (ref 0–40)
Alkaline Phosphatase: 58 IU/L (ref 39–117)
BILIRUBIN TOTAL: 0.6 mg/dL (ref 0.0–1.2)
BUN / CREAT RATIO: 16 (ref 12–28)
BUN: 17 mg/dL (ref 8–27)
CALCIUM: 9.8 mg/dL (ref 8.7–10.3)
CHLORIDE: 97 mmol/L (ref 96–106)
CO2: 24 mmol/L (ref 18–29)
Creatinine, Ser: 1.05 mg/dL — ABNORMAL HIGH (ref 0.57–1.00)
GFR, EST AFRICAN AMERICAN: 66 mL/min/{1.73_m2} (ref >59)
GFR, EST NON AFRICAN AMERICAN: 57 mL/min/{1.73_m2} — AB (ref >59)
Globulin, Total: 2.6 g/dL (ref 1.5–4.5)
Glucose: 96 mg/dL (ref 65–99)
POTASSIUM: 3.9 mmol/L (ref 3.5–5.2)
Sodium: 139 mmol/L (ref 134–144)
TOTAL PROTEIN: 7 g/dL (ref 6.0–8.5)

## 2015-11-10 ENCOUNTER — Other Ambulatory Visit: Payer: Self-pay | Admitting: *Deleted

## 2015-11-10 ENCOUNTER — Encounter: Payer: Self-pay | Admitting: *Deleted

## 2015-11-10 ENCOUNTER — Other Ambulatory Visit: Payer: Self-pay

## 2015-11-10 MED ORDER — CARVEDILOL 6.25 MG PO TABS: 6.2500 mg | ORAL_TABLET | Freq: Two times a day (BID) | ORAL | Status: DC

## 2015-11-10 NOTE — Progress Notes (Signed)
Pt in during husbands visit today, BP recheck since it was high at her visit on this past Monday

## 2016-01-14 ENCOUNTER — Telehealth: Payer: Self-pay | Admitting: Family Medicine

## 2016-01-14 ENCOUNTER — Other Ambulatory Visit: Payer: Self-pay

## 2016-01-14 ENCOUNTER — Other Ambulatory Visit: Payer: Self-pay | Admitting: *Deleted

## 2016-01-14 MED ORDER — HYDROCHLOROTHIAZIDE 25 MG PO TABS: ORAL_TABLET | ORAL | 0 refills | Status: DC

## 2016-01-14 MED ORDER — LISINOPRIL 20 MG PO TABS: ORAL_TABLET | ORAL | 0 refills | Status: DC

## 2016-01-14 MED ORDER — CARVEDILOL 6.25 MG PO TABS
6.2500 mg | ORAL_TABLET | Freq: Two times a day (BID) | ORAL | 1 refills | Status: DC
Start: 2016-01-14 — End: 2016-08-01

## 2016-01-14 NOTE — Telephone Encounter (Signed)
done

## 2016-02-28 ENCOUNTER — Ambulatory Visit: Payer: BLUE CROSS/BLUE SHIELD

## 2016-03-06 ENCOUNTER — Ambulatory Visit (INDEPENDENT_AMBULATORY_CARE_PROVIDER_SITE_OTHER): Payer: BLUE CROSS/BLUE SHIELD | Admitting: Family Medicine

## 2016-03-06 ENCOUNTER — Encounter: Payer: Self-pay | Admitting: Family Medicine

## 2016-03-06 ENCOUNTER — Ambulatory Visit (INDEPENDENT_AMBULATORY_CARE_PROVIDER_SITE_OTHER): Payer: BLUE CROSS/BLUE SHIELD

## 2016-03-06 VITALS — BP 127/89 | HR 77 | Temp 98.8°F | Ht 64.5 in | Wt 187.2 lb

## 2016-03-06 DIAGNOSIS — R0781 Pleurodynia: Secondary | ICD-10-CM | POA: Diagnosis not present

## 2016-03-06 NOTE — Progress Notes (Signed)
Subjective:  Patient ID: Veronica Gomez, female    DOB: 04-05-1956  Age: 60 y.o. MRN: [id number]  CC: Breathing Problem (pt here today c/o some discomfort once she takes a deep breath)   HPI Veronica Gomez presents for 4 days of feeling pressure substernally when she takes a deep breath. It does not radiate. It is not exertional. It is not there unless she's trying to take a deep breath with the exception of perhaps minimal discomfort intermittently. Overall the pain is minor. She is just concerned regarding the cause. She says that there is some evidence on testing that she may have had a minor heart attack at some point in life. However she does not have any recollection of event. She says it was found on the ultrasound. She continues to take her blood pressure and cholesterol medicine regularly. She denies having had any reflux symptoms recently and continues to take omeprazole.   History Veronica Gomez has a past medical history of CAD (coronary artery disease); Cardiomyopathy (HCC); GERD (gastroesophageal reflux disease); Hyperlipidemia; Hypertension; Ischemia; and Left bundle branch block (LBBB).   She has a past surgical history that includes Shoulder surgery (Right, 2014); Tubal ligation (1988); Finger surgery (2008); and Cervical polypectomy (2010).   Her family history includes Asthma in her mother; Heart disease in her father; Hypertension in her father and mother; Osteoporosis in her mother.She reports that she quit smoking about 27 years ago. She has never used smokeless tobacco. She reports that she drinks alcohol. She reports that she does not use drugs.    ROS Review of Systems  Constitutional: Negative for activity change, appetite change, fatigue and fever.  HENT: Negative for congestion, rhinorrhea and sore throat.   Eyes: Negative for visual disturbance.  Respiratory: Negative for cough and shortness of breath.   Cardiovascular: Positive for chest pain (see history of present  illness). Negative for palpitations.  Gastrointestinal: Negative for abdominal pain, diarrhea and nausea.  Genitourinary: Negative for dysuria.  Musculoskeletal: Negative for arthralgias and myalgias.  Neurological: Negative for weakness.  Psychiatric/Behavioral: Negative for agitation. The patient is not nervous/anxious.     Objective:  BP 127/89   Pulse 77   Temp 98.8 F (37.1 C) (Oral)   Ht 5' 4.5" (1.638 m)   Wt 187 lb 4 oz (84.9 kg)   LMP 09/13/2008 (Approximate)   BMI 31.65 kg/m   BP Readings from Last 3 Encounters:  03/06/16 127/89  11/10/15 137/88  11/08/15 (!) 144/103    Wt Readings from Last 3 Encounters:  03/06/16 187 lb 4 oz (84.9 kg)  11/08/15 187 lb (84.8 kg)  06/22/15 197 lb 7 oz (89.6 kg)     Physical Exam  Constitutional: She is oriented to person, place, and time. She appears well-developed and well-nourished. No distress.  HENT:  Head: Normocephalic and atraumatic.  Right Ear: External ear normal.  Left Ear: External ear normal.  Nose: Nose normal.  Mouth/Throat: Oropharynx is clear and moist.  Eyes: Conjunctivae and EOM are normal. Pupils are equal, round, and reactive to light.  Neck: Normal range of motion. Neck supple. No thyromegaly present.  Cardiovascular: Normal rate, regular rhythm and normal heart sounds.   No murmur heard. Pulmonary/Chest: Effort normal and breath sounds normal. No respiratory distress. She has no wheezes. She has no rales.  Abdominal: Soft. Bowel sounds are normal. She exhibits no distension. There is no tenderness.  Lymphadenopathy:    She has no cervical adenopathy.  Neurological: She is alert and oriented to  person, place, and time. She has normal reflexes.  Skin: Skin is warm and dry.  Psychiatric: She has a normal mood and affect. Her behavior is normal. Judgment and thought content normal.     Lab Results  Component Value Date   WBC 4.1 (A) 10/03/2013   HGB 14.0 10/03/2013   HCT 42.2 10/03/2013   GLUCOSE  96 11/08/2015   CHOL 171 11/08/2015   TRIG 140 11/08/2015   HDL 51 11/08/2015   LDLCALC 92 11/08/2015   ALT 18 11/08/2015   AST 15 11/08/2015   NA 139 11/08/2015   K 3.9 11/08/2015   CL 97 11/08/2015   CREATININE 1.05 (H) 11/08/2015   BUN 17 11/08/2015   CO2 24 11/08/2015   TSH 1.880 10/03/2013    Patient was never admitted.  Assessment & Plan:   Veronica Gomez was seen today for breathing problem.  Diagnoses and all orders for this visit:  Pleurodynia -     DG Chest 2 View; Future -     EKG 12-Lead   I am having Veronica Gomez maintain her aspirin, omeprazole, atorvastatin, carvedilol, lisinopril, and hydrochlorothiazide.  No orders of the defined types were placed in this encounter.  Patient was reassured that the symptoms did not appear to be cardiac. The pain with breathing is likely to be musculoskeletal rather than internal pulmonary in origin. At this time she does not desire treatment. However, she will follow up if symptoms worsen. Follow-up: Return if symptoms worsen or fail to improve.  Mechele Claude, M.D.

## 2016-03-07 NOTE — Progress Notes (Signed)
Your chest x-ray looked normal. Thanks, WS.

## 2016-04-03 ENCOUNTER — Ambulatory Visit (INDEPENDENT_AMBULATORY_CARE_PROVIDER_SITE_OTHER): Payer: BLUE CROSS/BLUE SHIELD | Admitting: Nurse Practitioner

## 2016-04-03 ENCOUNTER — Encounter: Payer: Self-pay | Admitting: Nurse Practitioner

## 2016-04-03 VITALS — BP 151/92 | HR 70 | Temp 98.6°F | Ht 64.0 in | Wt 190.0 lb

## 2016-04-03 DIAGNOSIS — Z23 Encounter for immunization: Secondary | ICD-10-CM

## 2016-04-03 DIAGNOSIS — M545 Low back pain, unspecified: Secondary | ICD-10-CM

## 2016-04-03 MED ORDER — PREDNISONE 10 MG (21) PO TBPK: ORAL_TABLET | ORAL | 0 refills | Status: DC

## 2016-04-03 MED ORDER — CYCLOBENZAPRINE HCL 10 MG PO TABS: 10.0000 mg | ORAL_TABLET | Freq: Three times a day (TID) | ORAL | 1 refills | Status: DC | PRN

## 2016-04-03 NOTE — Patient Instructions (Signed)
Back Pain, Adult Introduction Back pain is very common. The pain often gets better over time. The cause of back pain is usually not dangerous. Most people can learn to manage their back pain on their own. Follow these instructions at home: Watch your back pain for any changes. The following actions may help to lessen any pain you are feeling:  Stay active. Start with short walks on flat ground if you can. Try to walk farther each day.  Exercise regularly as told by your doctor. Exercise helps your back heal faster. It also helps avoid future injury by keeping your muscles strong and flexible.  Do not sit, drive, or stand in one place for more than 30 minutes.  Do not stay in bed. Resting more than 1-2 days can slow down your recovery.  Be careful when you bend or lift an object. Use good form when lifting:  Bend at your knees.  Keep the object close to your body.  Do not twist.  Sleep on a firm mattress. Lie on your side, and bend your knees. If you lie on your back, put a pillow under your knees.  Take medicines only as told by your doctor.  Put ice on the injured area.  Put ice in a plastic bag.  Place a towel between your skin and the bag.  Leave the ice on for 20 minutes, 2-3 times a day for the first 2-3 days. After that, you can switch between ice and heat packs.  Avoid feeling anxious or stressed. Find good ways to deal with stress, such as exercise.  Maintain a healthy weight. Extra weight puts stress on your back. Contact a doctor if:  You have pain that does not go away with rest or medicine.  You have worsening pain that goes down into your legs or buttocks.  You have pain that does not get better in one week.  You have pain at night.  You lose weight.  You have a fever or chills. Get help right away if:  You cannot control when you poop (bowel movement) or pee (urinate).  Your arms or legs feel weak.  Your arms or legs lose feeling  (numbness).  You feel sick to your stomach (nauseous) or throw up (vomit).  You have belly (abdominal) pain.  You feel like you may pass out (faint). This information is not intended to replace advice given to you by your health care provider. Make sure you discuss any questions you have with your health care provider. Document Released: 10/18/2007 Document Revised: 10/07/2015 Document Reviewed: 09/02/2013  2017 Elsevier  

## 2016-04-03 NOTE — Progress Notes (Signed)
Subjective:    Veronica Gomez is a 60 y.o. female who presents for evaluation of low back pain. The patient has had recurrent self limited episodes of low back pain in the past. Symptoms have been present for 3 weeks and are gradually worsening.  Onset was related to / precipitated by a twisting movement. The pain is located in the left lumbar area and does not radiate. The pain is described as aching, sharp and throbbing and occurs intermittently. She rates her pain as a 0 on a scale of 0-10. Symptoms are exacerbated by sitting, standing and twisting. Symptoms are improved by NSAIDs and rest. She has also tried change in body position and heating pad which provided no symptom relief. She has no other symptoms associated with the back pain. History indicative of potentially complicated back pain includes has had back problems intermittenly for many years..  The following portions of the patient's history were reviewed and updated as appropriate: allergies, current medications, past family history, past medical history, past social history, past surgical history and problem list.  Review of Systems Pertinent items noted in HPI and remainder of comprehensive ROS otherwise negative.    Objective:   Full range of motion without pain, no tenderness, no spasm, no curvature. Inspection and palpation: inspection of back is normal, no tenderness noted. Muscle tone and ROM exam: muscle tone normal without spasm. Straight leg raise: negative at 90 degrees degrees on the left. Neurological: normal DTRs, muscle strength and reflexes.    Assessment:    Nonspecific acute low back pain    Plan:     rest ' no bending or stooping Ice or heat- which ever feels better Meds ordered this encounter  Medications  . cyclobenzaprine (FLEXERIL) 10 MG tablet    Sig: Take 1 tablet (10 mg total) by mouth 3 (three) times daily as needed for muscle spasms.    Dispense:  30 tablet    Refill:  1    Order Specific  Question:   Supervising Provider    Answer:   VINCENT, CAROL L [[id number]]  . predniSONE (STERAPRED UNI-PAK 21 TAB) 10 MG (21) TBPK tablet    Sig: As directed x 6 days    Dispense:  21 tablet    Refill:  0    Order Specific Question:   Supervising Provider    Answer:   Johna SheriffVINCENT, CAROL L [[id number]]   Mary-Margaret Daphine DeutscherMartin, FNP

## 2016-05-05 ENCOUNTER — Other Ambulatory Visit: Payer: Self-pay | Admitting: Family Medicine

## 2016-05-05 ENCOUNTER — Other Ambulatory Visit: Payer: Self-pay | Admitting: Internal Medicine

## 2016-05-18 ENCOUNTER — Ambulatory Visit (INDEPENDENT_AMBULATORY_CARE_PROVIDER_SITE_OTHER): Payer: BLUE CROSS/BLUE SHIELD | Admitting: Family Medicine

## 2016-05-18 ENCOUNTER — Encounter: Payer: Self-pay | Admitting: Family Medicine

## 2016-05-18 VITALS — BP 131/92 | HR 78 | Temp 97.1°F | Ht 64.0 in | Wt 194.0 lb

## 2016-05-18 DIAGNOSIS — E559 Vitamin D deficiency, unspecified: Secondary | ICD-10-CM | POA: Diagnosis not present

## 2016-05-18 DIAGNOSIS — E7800 Pure hypercholesterolemia, unspecified: Secondary | ICD-10-CM | POA: Diagnosis not present

## 2016-05-18 DIAGNOSIS — I10 Essential (primary) hypertension: Secondary | ICD-10-CM | POA: Diagnosis not present

## 2016-05-18 LAB — CMP14+EGFR
A/G RATIO: 1.5 (ref 1.2–2.2)
ALBUMIN: 4.3 g/dL (ref 3.6–4.8)
ALT: 19 IU/L (ref 0–32)
AST: 18 IU/L (ref 0–40)
Alkaline Phosphatase: 60 IU/L (ref 39–117)
BILIRUBIN TOTAL: 0.5 mg/dL (ref 0.0–1.2)
BUN / CREAT RATIO: 15 (ref 12–28)
BUN: 15 mg/dL (ref 8–27)
CALCIUM: 9.5 mg/dL (ref 8.7–10.3)
CHLORIDE: 99 mmol/L (ref 96–106)
CO2: 26 mmol/L (ref 18–29)
Creatinine, Ser: 0.97 mg/dL (ref 0.57–1.00)
GFR, EST AFRICAN AMERICAN: 72 mL/min/{1.73_m2} (ref >59)
GFR, EST NON AFRICAN AMERICAN: 63 mL/min/{1.73_m2} (ref >59)
Globulin, Total: 2.8 g/dL (ref 1.5–4.5)
Glucose: 94 mg/dL (ref 65–99)
POTASSIUM: 4.2 mmol/L (ref 3.5–5.2)
Sodium: 141 mmol/L (ref 134–144)
TOTAL PROTEIN: 7.1 g/dL (ref 6.0–8.5)

## 2016-05-18 LAB — LIPID PANEL
CHOL/HDL RATIO: 4.2 ratio (ref 0.0–4.4)
Cholesterol, Total: 203 mg/dL — ABNORMAL HIGH (ref 100–199)
HDL: 48 mg/dL (ref >39)
LDL Calculated: 125 mg/dL — ABNORMAL HIGH (ref 0–99)
Triglycerides: 152 mg/dL — ABNORMAL HIGH (ref 0–149)
VLDL CHOLESTEROL CAL: 30 mg/dL (ref 5–40)

## 2016-05-18 MED ORDER — LOSARTAN POTASSIUM 50 MG PO TABS: 50.0000 mg | ORAL_TABLET | Freq: Every day | ORAL | 3 refills | Status: DC

## 2016-05-18 NOTE — Progress Notes (Signed)
   Subjective:    Patient ID: Veronica Gomez, female    DOB: [birth year], 61 y.o.   MRN: [id number]  HPI Pt here for follow up and management of chronic medical problems which includes hyperlipidemia and hypertension. She is taking medications regularly.  Blood pressures have been borderline both at last visit and today without significant change. She takes lisinopril, carvedilol, and hydrochlorothiazide for blood pressure. Lipids have been at target with LDL less than 100. She does complain of some chronic cough. We discussed possibilities of lisinopril may have a role in that cough and we could try her on an ARB    Patient Active Problem List   Diagnosis Date Noted  . Obesity (BMI 30.0-34.9) 09/14/2014  . HTN (hypertension) 11/20/2013  . Dyslipidemia 11/20/2013  . Cardiomyopathy, ischemic 11/20/2013  . LBBB (left bundle branch block) 11/20/2013   Outpatient Encounter Prescriptions as of 05/18/2016  Medication Sig  . aspirin 81 MG tablet Take 81 mg by mouth daily.  Marland Kitchen atorvastatin (LIPITOR) 20 MG tablet TAKE 1 BY MOUTH DAILY  . carvedilol (COREG) 6.25 MG tablet Take 1 tablet (6.25 mg total) by mouth 2 (two) times daily with a meal.  . hydrochlorothiazide (HYDRODIURIL) 25 MG tablet TAKE 1 BY MOUTH DAILY  . lisinopril (PRINIVIL,ZESTRIL) 20 MG tablet Take 1 Tablet by mouth once daily  . omeprazole (PRILOSEC) 20 MG capsule Take 20 mg by mouth daily.  . cyclobenzaprine (FLEXERIL) 10 MG tablet Take 1 tablet (10 mg total) by mouth 3 (three) times daily as needed for muscle spasms. (Patient not taking: Reported on 05/18/2016)  . [DISCONTINUED] predniSONE (STERAPRED UNI-PAK 21 TAB) 10 MG (21) TBPK tablet As directed x 6 days   No facility-administered encounter medications on file as of 05/18/2016.       Review of Systems  Constitutional: Negative.   HENT: Negative.   Eyes: Negative.   Respiratory: Negative.   Cardiovascular: Negative.   Gastrointestinal: Negative.   Endocrine: Negative.     Genitourinary: Negative.   Musculoskeletal: Negative.   Skin: Negative.   Allergic/Immunologic: Negative.   Neurological: Negative.   Hematological: Negative.   Psychiatric/Behavioral: Negative.        Objective:   Physical Exam  Constitutional: She is oriented to person, place, and time. She appears well-developed and well-nourished.  HENT:  Mouth/Throat: Oropharynx is clear and moist.  Cardiovascular: Normal rate, regular rhythm and normal heart sounds.   Pulmonary/Chest: Effort normal and breath sounds normal.  Neurological: She is alert and oriented to person, place, and time.  Psychiatric: She has a normal mood and affect. Her behavior is normal.   BP (!) 131/92   Pulse 78   Temp 97.1 F (36.2 C) (Oral)   Ht '5\' 4"'$  (1.626 m)   Wt 194 lb (88 kg)   LMP 09/13/2008 (Approximate)   BMI 33.30 kg/m         Assessment & Plan:  1. Essential hypertension Change from lisinopril to losartan. I have asked her to monitor pressures at home. Goal is 135/85 or below - CMP14+EGFR  2. Pure hypercholesterolemia No problems with atorvastatin. I did recommend taking at bedtime rather than morning - Lipid panel  3. Vitamin D deficiency History of low vitamin D level  Wardell Honour MD

## 2016-05-18 NOTE — Patient Instructions (Signed)
Continue current medications. Continue good therapeutic lifestyle changes which include good diet and exercise. Fall precautions discussed with patient. If an FOBT was given today- please return it to our front desk. If you are over 63 years old - you may need Prevnar 13 or the adult Pneumonia vaccine.  **Flu shots are available--- please call and schedule a FLU-CLINIC appointment**  After your visit with us today you will receive a survey in the mail or online from [employer] regarding your care with us. Please take a moment to fill this out. Your feedback is very important to us as you can help us better understand your patient needs as well as improve your experience and satisfaction. WE CARE ABOUT YOU!!!    

## 2016-08-01 ENCOUNTER — Other Ambulatory Visit: Payer: Self-pay | Admitting: Internal Medicine

## 2016-08-01 NOTE — Telephone Encounter (Signed)
Rx(s) sent to pharmacy electronically.  

## 2016-08-25 ENCOUNTER — Ambulatory Visit (INDEPENDENT_AMBULATORY_CARE_PROVIDER_SITE_OTHER): Payer: BLUE CROSS/BLUE SHIELD | Admitting: Internal Medicine

## 2016-08-25 VITALS — BP 126/78 | HR 67 | Ht 64.0 in | Wt 194.0 lb

## 2016-08-25 DIAGNOSIS — I447 Left bundle-branch block, unspecified: Secondary | ICD-10-CM

## 2016-08-25 DIAGNOSIS — J309 Allergic rhinitis, unspecified: Secondary | ICD-10-CM | POA: Insufficient documentation

## 2016-08-25 DIAGNOSIS — J302 Other seasonal allergic rhinitis: Secondary | ICD-10-CM

## 2016-08-25 DIAGNOSIS — E785 Hyperlipidemia, unspecified: Secondary | ICD-10-CM

## 2016-08-25 DIAGNOSIS — I10 Essential (primary) hypertension: Secondary | ICD-10-CM

## 2016-08-25 DIAGNOSIS — I255 Ischemic cardiomyopathy: Secondary | ICD-10-CM | POA: Diagnosis not present

## 2016-08-25 NOTE — Patient Instructions (Signed)
Your physician recommends that you schedule a follow-up appointment in: 1 year.  You may take over-the-counter flonase or nasal saline for relief of allergies.

## 2016-08-25 NOTE — Progress Notes (Signed)
OFFICE NOTE  Chief Complaint:  Itchy eyes, rhinitis  Primary Care Physician: Frederica Kuster, MD  HPI:  Veronica Gomez is a pleasant 61 year old female who moved to [place] in January of this year. Her past echo history is significant for hypertension, dyslipidemia and a former smoker, but quit over 25 years ago. 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 In 2009 she was noted to have an interventricular conduction delay, and was referred for stress test which was negative for ischemia. Records indicate 2013 she was having some chest discomfort and had a new, clear left bundle branch block. She underwent a nuclear stress test shows a fixed inferoapical defect. EF was 56%. An echocardiogram performed around the same time showed an EF of 40% with apical inferior wall hypokinesis. This was determined to be scar, no further workup was recommended as her symptoms were not thought to be due to ischemia. No ischemia was noted on her stress test. She is on appropriate medical therapy including aspirin, statin, ACE inhibitor, beta blocker and diuretic. She is quite active around her house and into your and denies any chest pain or worsening shortness of breath. Her weight has been stable. Her last office visit with her cardiologist was in December 2014 no changes to her medical regimen were recommended at that time. She did have a recent lipid profile which showed total cholesterol 162 and LDL of 99.  I saw [name] back in the office today. She has no new complaints. We recently did lipid testing which shows her LDL cholesterol to be 99. That is still higher than her goal LDL of less than 70. We talked about possibly adjusting her Lipitor up however she reported she had side effects with that in the past.  Veronica Gomez returns today for follow-up. Overall she is feeling well. She denies any worsening shortness of breath or chest pain. Blood pressure has been very  well controlled. Recently she had discussed medicine changes which were planning based on her cholesterol not being quite at goal. She was concerned about myalgias on higher dose statin and therefore I recommended adding Avandia. Alternatively her primary care provider suggested she could take a double dose of Lipitor every other day. She's been doing that for at least several months and is due for recheck of her cholesterol. She currently denies any myalgias or myopathy  I had the pleasure seeing [name] back today in the office. Slightly earlier than our normal follow-up appointment. She's changed insurance companies and is interested in starting some more exercise. She denies any chest pain but does get short of breath with activity. She was due for repeat echocardiogram which I like to get to see if she's had any change in her LV function. She is currently on aspirin, Lipitor, carvedilol and lisinopril.  08/25/2016  [name] returns today for follow-up. Overall she is doing well. She denies any worsening shortness of breath or chest pain. She has NYHA class I symptoms. Based on this she does not qualify for Entresto. She is currently on carvedilol and lisinopril although but has been having recent cough. I think this is allergies but her PCP did write to switch her from lisinopril over to losartan. She reports some rhinitis symptoms as well as itchy eyes and no significant pollen burden now. I suggested she may benefit from using a nasal steroid and saline nasal spray. She's currently using cetirizine and diphenhydramine. She  should avoid any decongestants such as pseudoephedrine.  PMHx:  Past Medical History:  Diagnosis Date  . CAD (coronary artery disease)   . Cardiomyopathy (HCC)   . GERD (gastroesophageal reflux disease)   . Hyperlipidemia   . Hypertension   . Ischemia   . Left bundle branch block (LBBB)     Past Surgical History:  Procedure Laterality Date  . CERVICAL POLYPECTOMY   2010  . FINGER SURGERY  2008  . SHOULDER SURGERY Right 2014  . TUBAL LIGATION  1988    FAMHx:  Family History  Problem Relation Age of Onset  . Osteoporosis Mother   . Asthma Mother   . Hypertension Mother   . Hypertension Father   . Heart disease Father     MI    SOCHx:   reports that she quit smoking about 28 years ago. She has never used smokeless tobacco. She reports that she drinks alcohol. She reports that she does not use drugs.  ALLERGIES:  No Known Allergies  ROS: Pertinent items noted in HPI and remainder of comprehensive ROS otherwise negative.  HOME MEDS: Current Outpatient Prescriptions  Medication Sig Dispense Refill  . aspirin 81 MG tablet Take 81 mg by mouth daily.    Marland Kitchen atorvastatin (LIPITOR) 20 MG tablet TAKE 1 BY MOUTH DAILY 90 tablet 3  . carvedilol (COREG) 6.25 MG tablet Take 1 tablet (6.25 mg total) by mouth 2 (two) times daily with a meal. PLEASE CONTACT OFFICE FOR ADDITIONAL REFILLS 30 tablet 0  . cyclobenzaprine (FLEXERIL) 10 MG tablet Take 1 tablet (10 mg total) by mouth 3 (three) times daily as needed for muscle spasms. 30 tablet 1  . hydrochlorothiazide (HYDRODIURIL) 25 MG tablet TAKE 1 BY MOUTH DAILY 90 tablet 0  . lisinopril (PRINIVIL,ZESTRIL) 20 MG tablet Take 1 Tablet by mouth once daily 90 tablet 0  . losartan (COZAAR) 50 MG tablet Take 1 tablet (50 mg total) by mouth daily. 90 tablet 3  . omeprazole (PRILOSEC) 20 MG capsule Take 20 mg by mouth daily.     No current facility-administered medications for this visit.     LABS/IMAGING: No results found for this or any previous visit (from the past 48 hour(s)). No results found.  VITALS: BP 126/78 (BP Location: Left Arm, Patient Position: Sitting, Cuff Size: Large)   Pulse 67   Ht  (1.626 m)   Wt 194 lb (88 kg)   LMP 09/13/2008 (Approximate)   BMI 33.30 kg/m   EXAM: General appearance: alert and no distress Neck: no carotid bruit and no JVD Lungs: clear to auscultation  bilaterally Heart: regular rate and rhythm, S1, S2 normal, no murmur, click, rub or gallop Abdomen: soft, non-tender; bowel sounds normal; no masses,  no organomegaly Extremities: extremities normal, atraumatic, no cyanosis or edema Pulses: 2+ and symmetric Skin: Skin color, texture, turgor normal. No rashes or lesions Neurologic: Grossly normal Psych: Mood, affect normal  EKG: Sinus rhythm at 67, LBBB  ASSESSMENT: 1. Ischemic cardiomyopathy, EF between 40 and 50% 2. Fixed inferoapical defect suggestive of scar on prior nuclear stress testing 3. Chronic LBBB 4. Hypertension 5. Dyslipidemia 6. Former smoker  PLAN: 1.   Mrs. Luz seems to be doing well. EF is holding around 40%. There may be a fixed inferoapical scar. She has chronic left bundle branch block. EF is not worsened and she maintains New York Heart Association class I symptoms. Blood pressure is at goal today. We'll continue current medications. Of encouraged physical exercise and  activity along with additional weight loss. As mentioned above, she could use an over-the-counter nasal steroid and nasal saline spray for her allergic symptoms with less likelihood of developing fatigue compared to antihistamines.  Follow-up with me annually.  Chrystie Nose, MD, [facility] Attending Cardiologist [facility]  Chrystie Nose 08/25/2016, 2:29 PM

## 2016-09-07 ENCOUNTER — Other Ambulatory Visit: Payer: Self-pay | Admitting: Nurse Practitioner

## 2016-09-11 ENCOUNTER — Encounter: Payer: Self-pay | Admitting: Family Medicine

## 2016-09-11 ENCOUNTER — Ambulatory Visit (INDEPENDENT_AMBULATORY_CARE_PROVIDER_SITE_OTHER): Payer: BLUE CROSS/BLUE SHIELD | Admitting: Family Medicine

## 2016-09-11 VITALS — BP 125/78 | HR 77 | Temp 97.6°F | Ht 64.0 in | Wt 192.0 lb

## 2016-09-11 DIAGNOSIS — I10 Essential (primary) hypertension: Secondary | ICD-10-CM

## 2016-09-11 DIAGNOSIS — I255 Ischemic cardiomyopathy: Secondary | ICD-10-CM | POA: Diagnosis not present

## 2016-09-11 DIAGNOSIS — J301 Allergic rhinitis due to pollen: Secondary | ICD-10-CM

## 2016-09-11 MED ORDER — BETAMETHASONE SOD PHOS & ACET 6 (3-3) MG/ML IJ SUSP
6.0000 mg | Freq: Once | INTRAMUSCULAR | Status: AC
  Administered 2016-09-11: 6 mg via INTRAMUSCULAR

## 2016-09-11 MED ORDER — FEXOFENADINE-PSEUDOEPHED ER 180-240 MG PO TB24
1.0000 | ORAL_TABLET | Freq: Every evening | ORAL | 11 refills | Status: DC
Start: 2016-09-11 — End: 2017-07-25

## 2016-09-11 NOTE — Progress Notes (Signed)
Subjective:  Patient ID: Veronica Gomez, female    DOB: 04/20/1956  Age: 61 y.o. MRN: [id number]  CC: spots on throat (pt here today c/o "spots on her throat" and they aren't painful)   HPI [name] presents for Patient has allergic rhinitis symptoms including sneezing frequently sniffling, clear rhinorrhea, watery and itchy eyes. There has been no fever no chills no sweats. No earaches. There is some scratchy throat but no sore throat or difficulty swallowing. There is some nasal congestion. She says her symptoms are driving her crazy. Patient wants to be sure that whatever she is prescribed it is compatible with her treatment for hypertension and her ischemic cardiomyopathy. Both of those are stable at this time she states.    History Anny has a past medical history of CAD (coronary artery disease); Cardiomyopathy (HCC); GERD (gastroesophageal reflux disease); Hyperlipidemia; Hypertension; Ischemia; and Left bundle branch block (LBBB).   She has a past surgical history that includes Shoulder surgery (Right, 2014); Tubal ligation (1988); Finger surgery (2008); and Cervical polypectomy (2010).   Her family history includes Asthma in her mother; Heart disease in her father; Hypertension in her father and mother; Osteoporosis in her mother.She reports that she quit smoking about 28 years ago. She has never used smokeless tobacco. She reports that she drinks alcohol. She reports that she does not use drugs.    ROS Review of Systems  Constitutional: Negative for activity change, appetite change, chills and fever.  HENT: Positive for congestion, postnasal drip, rhinorrhea and sinus pressure. Negative for ear discharge, ear pain, hearing loss, nosebleeds, sneezing and trouble swallowing.   Respiratory: Negative for chest tightness and shortness of breath.   Cardiovascular: Negative for chest pain and palpitations.  Skin: Negative for rash.    Objective:  BP 125/78   Pulse 77   Temp  97.6 F (36.4 C) (Oral)   Ht  (1.626 m)   Wt 192 lb (87.1 kg)   LMP 09/13/2008 (Approximate)   BMI 32.96 kg/m   BP Readings from Last 3 Encounters:  09/11/16 125/78  08/25/16 126/78  05/18/16 (!) 131/92    Wt Readings from Last 3 Encounters:  09/11/16 192 lb (87.1 kg)  08/25/16 194 lb (88 kg)  05/18/16 194 lb (88 kg)     Physical Exam  Constitutional: She appears well-developed and well-nourished.  HENT:  Head: Normocephalic and atraumatic.  Right Ear: Tympanic membrane and external ear normal. No decreased hearing is noted.  Left Ear: Tympanic membrane and external ear normal. No decreased hearing is noted.  Nose: Mucosal edema present. Right sinus exhibits no frontal sinus tenderness. Left sinus exhibits no frontal sinus tenderness.  Mouth/Throat: No oropharyngeal exudate or posterior oropharyngeal erythema.  Neck: No Brudzinski's sign noted.  Pulmonary/Chest: Breath sounds normal. No respiratory distress.  Lymphadenopathy:       Head (right side): No preauricular adenopathy present.       Head (left side): No preauricular adenopathy present.       Right cervical: No superficial cervical adenopathy present.      Left cervical: No superficial cervical adenopathy present.      Assessment & Plan:   [name] was seen today for spots on throat.  Diagnoses and all orders for this visit:  Seasonal allergic rhinitis due to pollen -     betamethasone acetate-betamethasone sodium phosphate (CELESTONE) injection 6 mg; Inject 1 mL (6 mg total) into the muscle once.  Essential hypertension  Cardiomyopathy, ischemic  Other orders -  fexofenadine-pseudoephedrine (ALLEGRA-D 24) 180-240 MG 24 hr tablet; Take 1 tablet by mouth every evening. For allergy and congestion       I am having Ms. Wenberg start on fexofenadine-pseudoephedrine. I am also having her maintain her aspirin, omeprazole, atorvastatin, hydrochlorothiazide, cyclobenzaprine, lisinopril, losartan, and  carvedilol. We will continue to administer betamethasone acetate-betamethasone sodium phosphate.  Allergies as of 09/11/2016   No Known Allergies     Medication List       Accurate as of 09/11/16  4:37 PM. Always use your most recent med list.          aspirin 81 MG tablet Take 81 mg by mouth daily.   atorvastatin 20 MG tablet Commonly known as:  LIPITOR TAKE 1 BY MOUTH DAILY   carvedilol 6.25 MG tablet Commonly known as:  COREG Take 1 tablet (6.25 mg total) by mouth 2 (two) times daily with a meal. PLEASE CONTACT OFFICE FOR ADDITIONAL REFILLS   cyclobenzaprine 10 MG tablet Commonly known as:  FLEXERIL Take 1 tablet (10 mg total) by mouth 3 (three) times daily as needed for muscle spasms.   fexofenadine-pseudoephedrine 180-240 MG 24 hr tablet Commonly known as:  ALLEGRA-D 24 Take 1 tablet by mouth every evening. For allergy and congestion   hydrochlorothiazide 25 MG tablet Commonly known as:  HYDRODIURIL TAKE 1 BY MOUTH DAILY   lisinopril 20 MG tablet Commonly known as:  PRINIVIL,ZESTRIL Take 1 Tablet by mouth once daily   losartan 50 MG tablet Commonly known as:  COZAAR Take 1 tablet (50 mg total) by mouth daily.   omeprazole 20 MG capsule Commonly known as:  PRILOSEC Take 20 mg by mouth daily.        Follow-up: No Follow-up on file.  Mechele Claude, M.D.

## 2016-10-04 ENCOUNTER — Telehealth: Payer: Self-pay | Admitting: Family Medicine

## 2016-10-04 NOTE — Telephone Encounter (Signed)
PT REPORTED ([facility]) getting record

## 2016-10-10 ENCOUNTER — Other Ambulatory Visit: Payer: Self-pay | Admitting: Family Medicine

## 2016-12-30 ENCOUNTER — Other Ambulatory Visit: Payer: Self-pay | Admitting: Family Medicine

## 2017-01-06 ENCOUNTER — Other Ambulatory Visit: Payer: Self-pay | Admitting: Family Medicine

## 2017-01-16 ENCOUNTER — Other Ambulatory Visit: Payer: Self-pay | Admitting: Family Medicine

## 2017-01-18 ENCOUNTER — Other Ambulatory Visit: Payer: Self-pay | Admitting: Internal Medicine

## 2017-01-22 ENCOUNTER — Other Ambulatory Visit: Payer: Self-pay | Admitting: Internal Medicine

## 2017-01-24 ENCOUNTER — Telehealth: Payer: Self-pay | Admitting: Internal Medicine

## 2017-01-24 MED ORDER — CARVEDILOL 6.25 MG PO TABS: 6.2500 mg | ORAL_TABLET | Freq: Two times a day (BID) | ORAL | 2 refills | Status: DC

## 2017-01-24 NOTE — Telephone Encounter (Signed)
New message     Prescription sent in wrong , pt just seen Hilty in April 2018    *STAT* If patient is at the pharmacy, call can be transferred to refill team.   1. Which medications need to be refilled? (please list name of each medication and dose if known)  carvedilol (COREG) 6.25 MG tablet Take 1 Tablet by mouth 2 times a day with meals     2. Which pharmacy/location (including street and city if local pharmacy) is medication to be sent to? [facility]   3. Do they need a 30 day or 90 day supply?  90 day

## 2017-01-24 NOTE — Telephone Encounter (Signed)
Rx(s) sent to pharmacy electronically.  

## 2017-03-23 ENCOUNTER — Other Ambulatory Visit: Payer: Self-pay | Admitting: Pediatrics

## 2017-03-25 ENCOUNTER — Other Ambulatory Visit: Payer: Self-pay | Admitting: Family Medicine

## 2017-04-14 ENCOUNTER — Other Ambulatory Visit: Payer: Self-pay | Admitting: Pediatrics

## 2017-04-16 MED ORDER — ATORVASTATIN CALCIUM 20 MG PO TABS: 20.0000 mg | ORAL_TABLET | Freq: Every day | ORAL | 0 refills | Status: DC

## 2017-07-06 ENCOUNTER — Other Ambulatory Visit: Payer: Self-pay | Admitting: Family Medicine

## 2017-07-07 ENCOUNTER — Other Ambulatory Visit: Payer: Self-pay | Admitting: Pediatrics

## 2017-07-25 ENCOUNTER — Ambulatory Visit (INDEPENDENT_AMBULATORY_CARE_PROVIDER_SITE_OTHER): Payer: BLUE CROSS/BLUE SHIELD | Admitting: Family

## 2017-07-25 ENCOUNTER — Encounter: Payer: Self-pay | Admitting: Family

## 2017-07-25 VITALS — BP 138/85 | HR 69 | Temp 97.6°F | Ht 64.0 in | Wt 198.2 lb

## 2017-07-25 DIAGNOSIS — E669 Obesity, unspecified: Secondary | ICD-10-CM

## 2017-07-25 DIAGNOSIS — I447 Left bundle-branch block, unspecified: Secondary | ICD-10-CM

## 2017-07-25 DIAGNOSIS — E785 Hyperlipidemia, unspecified: Secondary | ICD-10-CM

## 2017-07-25 DIAGNOSIS — Z0000 Encounter for general adult medical examination without abnormal findings: Secondary | ICD-10-CM

## 2017-07-25 DIAGNOSIS — I255 Ischemic cardiomyopathy: Secondary | ICD-10-CM

## 2017-07-25 DIAGNOSIS — Z114 Encounter for screening for human immunodeficiency virus [HIV]: Secondary | ICD-10-CM

## 2017-07-25 DIAGNOSIS — Z1159 Encounter for screening for other viral diseases: Secondary | ICD-10-CM

## 2017-07-25 DIAGNOSIS — Z23 Encounter for immunization: Secondary | ICD-10-CM

## 2017-07-25 DIAGNOSIS — Z1211 Encounter for screening for malignant neoplasm of colon: Secondary | ICD-10-CM

## 2017-07-25 DIAGNOSIS — I10 Essential (primary) hypertension: Secondary | ICD-10-CM

## 2017-07-25 DIAGNOSIS — K219 Gastro-esophageal reflux disease without esophagitis: Secondary | ICD-10-CM | POA: Insufficient documentation

## 2017-07-25 MED ORDER — HYDROCHLOROTHIAZIDE 25 MG PO TABS: 25.0000 mg | ORAL_TABLET | Freq: Every day | ORAL | 1 refills | Status: DC

## 2017-07-25 MED ORDER — OMEPRAZOLE 20 MG PO CPDR: 20.0000 mg | DELAYED_RELEASE_CAPSULE | Freq: Every day | ORAL | 1 refills | Status: DC

## 2017-07-25 MED ORDER — LOSARTAN POTASSIUM 50 MG PO TABS: 50.0000 mg | ORAL_TABLET | Freq: Every day | ORAL | 1 refills | Status: DC

## 2017-07-25 MED ORDER — CARVEDILOL 6.25 MG PO TABS: 6.2500 mg | ORAL_TABLET | Freq: Two times a day (BID) | ORAL | 2 refills | Status: DC

## 2017-07-25 MED ORDER — ATORVASTATIN CALCIUM 20 MG PO TABS: 20.0000 mg | ORAL_TABLET | Freq: Every day | ORAL | 1 refills | Status: DC

## 2017-07-25 NOTE — Patient Instructions (Signed)

## 2017-07-25 NOTE — Progress Notes (Signed)
Subjective:    Patient ID: Veronica Gomez, female    DOB: June 20, 1955, 62 y.o.   MRN: [id number]  Pt presents to the office today for CPE without pap. PT is followed by Cardiologists annually for left bundle branch block and cardiomyopathy that is stable at this time.  Hypertension  This is a chronic problem. The current episode started more than 1 year ago. The problem has been resolved since onset. The problem is controlled. Pertinent negatives include no malaise/fatigue, peripheral edema or shortness of breath. Risk factors for coronary artery disease include dyslipidemia, obesity, sedentary lifestyle and family history. The current treatment provides moderate improvement.  Hyperlipidemia  This is a chronic problem. The current episode started more than 1 year ago. The problem is uncontrolled. Recent lipid tests were reviewed and are high. Exacerbating diseases include obesity. Pertinent negatives include no shortness of breath. Current antihyperlipidemic treatment includes statins. The current treatment provides moderate improvement of lipids. Compliance problems include medication cost.  Risk factors for coronary artery disease include dyslipidemia, family history, hypertension and a sedentary lifestyle.  Gastroesophageal Reflux  She complains of heartburn and nausea. She reports no belching or no coughing. This is a chronic problem. The current episode started more than 1 year ago. The problem occurs occasionally. The symptoms are aggravated by certain foods. Risk factors include obesity. She has tried a PPI for the symptoms. The treatment provided moderate relief.      Review of Systems  Constitutional: Negative for malaise/fatigue.  Respiratory: Negative for cough and shortness of breath.   Gastrointestinal: Positive for heartburn and nausea.  All other systems reviewed and are negative.      Objective:   Physical Exam  Constitutional: She is oriented to person, place, and time. She  appears well-developed and well-nourished. No distress.  HENT:  Head: Normocephalic and atraumatic.  Right Ear: External ear normal.  Left Ear: External ear normal.  Nose: Nose normal.  Mouth/Throat: Oropharynx is clear and moist.  Eyes: Pupils are equal, round, and reactive to light.  Neck: Normal range of motion. Neck supple. No thyromegaly present.  Cardiovascular: Normal rate, regular rhythm, normal heart sounds and intact distal pulses.  No murmur heard. Pulmonary/Chest: Effort normal and breath sounds normal. No respiratory distress. She has no wheezes.  Abdominal: Soft. Bowel sounds are normal. She exhibits no distension. There is no tenderness.  Musculoskeletal: Normal range of motion. She exhibits no edema or tenderness.  Neurological: She is alert and oriented to person, place, and time. No cranial nerve deficit.  Skin: Skin is warm and dry.  Psychiatric: She has a normal mood and affect. Her behavior is normal. Judgment and thought content normal.  Vitals reviewed.     BP 138/85   Pulse 69   Temp 97.6 F (36.4 C) (Oral)   Ht '5\' 4"'$  (1.626 m)   Wt 198 lb 3.2 oz (89.9 kg)   LMP 09/13/2008 (Approximate)   BMI 34.02 kg/m      Assessment & Plan:  1. Essential hypertension - CMP14+EGFR - carvedilol (COREG) 6.25 MG tablet; Take 1 tablet (6.25 mg total) by mouth 2 (two) times daily with a meal.  Dispense: 180 tablet; Refill: 2 - hydrochlorothiazide (HYDRODIURIL) 25 MG tablet; Take 1 tablet (25 mg total) by mouth daily.  Dispense: 90 tablet; Refill: 1 - losartan (COZAAR) 50 MG tablet; Take 1 tablet (50 mg total) by mouth daily.  Dispense: 90 tablet; Refill: 1  2. Obesity (BMI 30.0-34.9) - CMP14+EGFR  3. Dyslipidemia  -  CMP14+EGFR - Lipid panel - atorvastatin (LIPITOR) 20 MG tablet; Take 1 tablet (20 mg total) by mouth daily.  Dispense: 90 tablet; Refill: 1  4. LBBB (left bundle branch block) - CMP14+EGFR  5. Cardiomyopathy, ischemic - CMP14+EGFR  6. Annual  physical exam  - Anemia Profile B - CMP14+EGFR - Lipid panel - VITAMIN D 25 Hydroxy (Vit-D Deficiency, Fractures) - TSH - Hepatitis C antibody  7. Need for hepatitis C screening test - CMP14+EGFR - Hepatitis C antibody  8. Encounter for screening for HI - HIV antibody  9. Colon cancer screening - Ambulatory referral to Gastroenterology  10. Gastroesophageal reflux disease, esophagitis presence not specified - omeprazole (PRILOSEC) 20 MG capsule; Take 1 capsule (20 mg total) by mouth daily.  Dispense: 90 capsule; Refill: 1   Continue all meds Labs pending Health Maintenance reviewed- TDAP given today Diet and exercise encouraged RTO 1 year   [name], FNP

## 2017-07-25 NOTE — Addendum Note (Signed)
Addended by: Almeta MonasSTONE, Marlissa Emerick M on: 07/25/2017 01:03 PM   Modules accepted: Orders

## 2017-07-26 LAB — CMP14+EGFR
A/G RATIO: 1.8 (ref 1.2–2.2)
ALBUMIN: 4.5 g/dL (ref 3.6–4.8)
ALT: 18 IU/L (ref 0–32)
AST: 16 IU/L (ref 0–40)
Alkaline Phosphatase: 63 IU/L (ref 39–117)
BUN / CREAT RATIO: 18 (ref 12–28)
BUN: 17 mg/dL (ref 8–27)
Bilirubin Total: 0.5 mg/dL (ref 0.0–1.2)
CO2: 23 mmol/L (ref 20–29)
Calcium: 9.5 mg/dL (ref 8.7–10.3)
Chloride: 104 mmol/L (ref 96–106)
Creatinine, Ser: 0.97 mg/dL (ref 0.57–1.00)
GFR calc non Af Amer: 62 mL/min/{1.73_m2} (ref >59)
GFR, EST AFRICAN AMERICAN: 71 mL/min/{1.73_m2} (ref >59)
Globulin, Total: 2.5 g/dL (ref 1.5–4.5)
Glucose: 85 mg/dL (ref 65–99)
POTASSIUM: 4 mmol/L (ref 3.5–5.2)
Sodium: 142 mmol/L (ref 134–144)
TOTAL PROTEIN: 7 g/dL (ref 6.0–8.5)

## 2017-07-26 LAB — ANEMIA PROFILE B
BASOS: 1 %
Basophils Absolute: 0.1 10*3/uL (ref 0.0–0.2)
EOS (ABSOLUTE): 0.2 10*3/uL (ref 0.0–0.4)
Eos: 4 %
FERRITIN: 124 ng/mL (ref 15–150)
HEMATOCRIT: 41.6 % (ref 34.0–46.6)
Hemoglobin: 13.9 g/dL (ref 11.1–15.9)
Immature Grans (Abs): 0 10*3/uL (ref 0.0–0.1)
Immature Granulocytes: 0 %
Iron Saturation: 43 % (ref 15–55)
Iron: 109 ug/dL (ref 27–139)
LYMPHS ABS: 1.9 10*3/uL (ref 0.7–3.1)
Lymphs: 37 %
MCH: 31.6 pg (ref 26.6–33.0)
MCHC: 33.4 g/dL (ref 31.5–35.7)
MCV: 95 fL (ref 79–97)
MONOCYTES: 7 %
Monocytes Absolute: 0.4 10*3/uL (ref 0.1–0.9)
NEUTROS ABS: 2.6 10*3/uL (ref 1.4–7.0)
Neutrophils: 51 %
Platelets: 259 10*3/uL (ref 150–379)
RBC: 4.4 x10E6/uL (ref 3.77–5.28)
RDW: 13.3 % (ref 12.3–15.4)
RETIC CT PCT: 1.1 % (ref 0.6–2.6)
Total Iron Binding Capacity: 256 ug/dL (ref 250–450)
UIBC: 147 ug/dL (ref 118–369)
VITAMIN B 12: 367 pg/mL (ref 232–1245)
WBC: 5.1 10*3/uL (ref 3.4–10.8)

## 2017-07-26 LAB — LIPID PANEL
Chol/HDL Ratio: 3 ratio (ref 0.0–4.4)
Cholesterol, Total: 155 mg/dL (ref 100–199)
HDL: 51 mg/dL (ref >39)
LDL Calculated: 85 mg/dL (ref 0–99)
Triglycerides: 96 mg/dL (ref 0–149)
VLDL Cholesterol Cal: 19 mg/dL (ref 5–40)

## 2017-07-26 LAB — VITAMIN D 25 HYDROXY (VIT D DEFICIENCY, FRACTURES): VIT D 25 HYDROXY: 27.2 ng/mL — AB (ref 30.0–100.0)

## 2017-07-26 LAB — TSH: TSH: 1.83 u[IU]/mL (ref 0.450–4.500)

## 2017-07-26 LAB — HIV ANTIBODY (ROUTINE TESTING W REFLEX): HIV Screen 4th Generation wRfx: NONREACTIVE

## 2017-07-26 LAB — HEPATITIS C ANTIBODY: Hep C Virus Ab: <0.1 s/co ratio (ref 0.0–0.9)

## 2017-07-27 MED ORDER — VITAMIN D (ERGOCALCIFEROL) 1.25 MG (50000 UNIT) PO CAPS
50000.0000 [IU] | ORAL_CAPSULE | ORAL | 1 refills | Status: DC
Start: 2017-07-27 — End: 2017-10-24

## 2017-07-27 NOTE — Addendum Note (Signed)
Addended byDory Peru: RINTELMANN, GINA C on: 07/27/2017 10:28 AM   Modules accepted: Orders

## 2017-08-28 ENCOUNTER — Encounter: Payer: Self-pay | Admitting: Family

## 2017-10-13 ENCOUNTER — Other Ambulatory Visit: Payer: Self-pay | Admitting: Family

## 2017-10-13 DIAGNOSIS — E785 Hyperlipidemia, unspecified: Secondary | ICD-10-CM

## 2017-10-13 DIAGNOSIS — I10 Essential (primary) hypertension: Secondary | ICD-10-CM

## 2017-10-24 ENCOUNTER — Other Ambulatory Visit: Payer: Self-pay | Admitting: *Deleted

## 2017-10-24 DIAGNOSIS — I10 Essential (primary) hypertension: Secondary | ICD-10-CM

## 2017-10-24 DIAGNOSIS — K219 Gastro-esophageal reflux disease without esophagitis: Secondary | ICD-10-CM

## 2017-10-24 MED ORDER — VITAMIN D (ERGOCALCIFEROL) 1.25 MG (50000 UNIT) PO CAPS: 50000.0000 [IU] | ORAL_CAPSULE | ORAL | 0 refills | Status: DC

## 2017-10-24 MED ORDER — LOSARTAN POTASSIUM 50 MG PO TABS: 50.0000 mg | ORAL_TABLET | Freq: Every day | ORAL | 0 refills | Status: DC

## 2017-10-24 MED ORDER — OMEPRAZOLE 20 MG PO CPDR: 20.0000 mg | DELAYED_RELEASE_CAPSULE | Freq: Every day | ORAL | 0 refills | Status: DC

## 2018-01-05 ENCOUNTER — Other Ambulatory Visit: Payer: Self-pay | Admitting: Family

## 2018-01-05 DIAGNOSIS — I10 Essential (primary) hypertension: Secondary | ICD-10-CM

## 2018-01-05 NOTE — Telephone Encounter (Signed)
OV 07/25/17 RTC 1 yr

## 2018-03-05 ENCOUNTER — Other Ambulatory Visit: Payer: Self-pay | Admitting: Family

## 2018-03-05 DIAGNOSIS — I10 Essential (primary) hypertension: Secondary | ICD-10-CM

## 2018-03-05 DIAGNOSIS — E785 Hyperlipidemia, unspecified: Secondary | ICD-10-CM

## 2018-03-05 NOTE — Telephone Encounter (Signed)
Last seen 07/25/17 and last Vit D 07/25/17  27.2

## 2018-03-06 ENCOUNTER — Other Ambulatory Visit: Payer: Self-pay | Admitting: Family

## 2018-03-12 ENCOUNTER — Other Ambulatory Visit: Payer: Self-pay | Admitting: Family

## 2018-03-12 DIAGNOSIS — K219 Gastro-esophageal reflux disease without esophagitis: Secondary | ICD-10-CM

## 2018-03-12 NOTE — Telephone Encounter (Signed)
Last seen 07/25/17  [facility]

## 2018-03-13 ENCOUNTER — Other Ambulatory Visit: Payer: Self-pay | Admitting: Family

## 2018-03-13 DIAGNOSIS — K219 Gastro-esophageal reflux disease without esophagitis: Secondary | ICD-10-CM

## 2018-03-29 ENCOUNTER — Other Ambulatory Visit: Payer: Self-pay | Admitting: Family

## 2018-03-29 DIAGNOSIS — I10 Essential (primary) hypertension: Secondary | ICD-10-CM

## 2018-04-01 NOTE — Telephone Encounter (Signed)
OV 07/25/17 rtc 1 yr

## 2018-05-09 ENCOUNTER — Other Ambulatory Visit: Payer: Self-pay | Admitting: Family

## 2018-05-09 DIAGNOSIS — E785 Hyperlipidemia, unspecified: Secondary | ICD-10-CM

## 2018-05-09 NOTE — Telephone Encounter (Signed)
OV 07/25/17 rtc 1 yr 

## 2018-06-24 ENCOUNTER — Ambulatory Visit (INDEPENDENT_AMBULATORY_CARE_PROVIDER_SITE_OTHER): Payer: Medicare Other | Admitting: Family

## 2018-06-24 ENCOUNTER — Encounter: Payer: Self-pay | Admitting: Family

## 2018-06-24 VITALS — BP 137/98 | HR 87 | Temp 97.2°F | Ht 64.0 in | Wt 199.2 lb

## 2018-06-24 DIAGNOSIS — M5441 Lumbago with sciatica, right side: Secondary | ICD-10-CM

## 2018-06-24 DIAGNOSIS — E669 Obesity, unspecified: Secondary | ICD-10-CM | POA: Diagnosis not present

## 2018-06-24 DIAGNOSIS — G8929 Other chronic pain: Secondary | ICD-10-CM

## 2018-06-24 DIAGNOSIS — E785 Hyperlipidemia, unspecified: Secondary | ICD-10-CM | POA: Diagnosis not present

## 2018-06-24 DIAGNOSIS — M1712 Unilateral primary osteoarthritis, left knee: Secondary | ICD-10-CM

## 2018-06-24 DIAGNOSIS — I10 Essential (primary) hypertension: Secondary | ICD-10-CM | POA: Diagnosis not present

## 2018-06-24 DIAGNOSIS — K219 Gastro-esophageal reflux disease without esophagitis: Secondary | ICD-10-CM | POA: Diagnosis not present

## 2018-06-24 DIAGNOSIS — I255 Ischemic cardiomyopathy: Secondary | ICD-10-CM

## 2018-06-24 DIAGNOSIS — I447 Left bundle-branch block, unspecified: Secondary | ICD-10-CM

## 2018-06-24 MED ORDER — HYDROCHLOROTHIAZIDE 25 MG PO TABS: 25.0000 mg | ORAL_TABLET | Freq: Every day | ORAL | 3 refills | Status: DC

## 2018-06-24 MED ORDER — OMEPRAZOLE 20 MG PO CPDR: 20.0000 mg | DELAYED_RELEASE_CAPSULE | Freq: Every day | ORAL | 2 refills | Status: DC

## 2018-06-24 MED ORDER — LOSARTAN POTASSIUM 100 MG PO TABS: 100.0000 mg | ORAL_TABLET | Freq: Every day | ORAL | 3 refills | Status: DC

## 2018-06-24 MED ORDER — DICLOFENAC SODIUM 75 MG PO TBEC: 75.0000 mg | DELAYED_RELEASE_TABLET | Freq: Two times a day (BID) | ORAL | 4 refills | Status: DC

## 2018-06-24 MED ORDER — VITAMIN D (ERGOCALCIFEROL) 1.25 MG (50000 UNIT) PO CAPS: ORAL_CAPSULE | ORAL | 3 refills | Status: DC

## 2018-06-24 MED ORDER — CARVEDILOL 6.25 MG PO TABS: 6.2500 mg | ORAL_TABLET | Freq: Two times a day (BID) | ORAL | 1 refills | Status: DC

## 2018-06-24 MED ORDER — ATORVASTATIN CALCIUM 20 MG PO TABS: 20.0000 mg | ORAL_TABLET | Freq: Every day | ORAL | 3 refills | Status: DC

## 2018-06-24 NOTE — Patient Instructions (Signed)

## 2018-06-24 NOTE — Progress Notes (Signed)
Subjective:    Patient ID: Veronica Gomez, female    DOB: 11-19-1955, 63 y.o.   MRN: [id number]  Chief Complaint  Patient presents with  . Back Pain  . Medical Management of Chronic Issues    six month recheck   Pt presents to the office today for chronic follow up. PT is followed by Cardiologists annually for left bundle branch block and cardiomyopathy that is stable at this time.  Hypertension  This is a chronic problem. The current episode started more than 1 year ago. The problem has been waxing and waning since onset. The problem is uncontrolled. Associated symptoms include malaise/fatigue. Pertinent negatives include no peripheral edema or shortness of breath. Risk factors for coronary artery disease include dyslipidemia and obesity. There is no history of CAD/MI or heart failure.  Gastroesophageal Reflux  She reports no belching, no choking or no heartburn. This is a chronic problem. The current episode started more than 1 year ago. The problem occurs occasionally. Risk factors include obesity. She has tried a PPI for the symptoms. The treatment provided moderate relief.  Hyperlipidemia  This is a chronic problem. The current episode started more than 1 year ago. The problem is controlled. Recent lipid tests were reviewed and are normal. Exacerbating diseases include obesity. Associated symptoms include leg pain. Pertinent negatives include no shortness of breath. Current antihyperlipidemic treatment includes statins. The current treatment provides moderate improvement of lipids. Risk factors for coronary artery disease include dyslipidemia, hypertension and a sedentary lifestyle.  Back Pain  This is a chronic problem. The current episode started more than 1 year ago. The problem occurs intermittently. The problem has been waxing and waning since onset. The pain is present in the lumbar spine. The quality of the pain is described as aching. The pain is at a severity of 3/10. The pain is  mild. The symptoms are aggravated by bending, standing and twisting. Associated symptoms include leg pain. Pertinent negatives include no bladder incontinence, bowel incontinence, dysuria or weakness. She has tried bed rest and muscle relaxant for the symptoms. The treatment provided mild relief.  Arthritis  Presents for follow-up visit. She complains of pain and stiffness. The symptoms have been stable. Affected locations include the left knee. Her pain is at a severity of 2/10. Pertinent negatives include no dysuria.      Review of Systems  Constitutional: Positive for malaise/fatigue.  Respiratory: Negative for choking and shortness of breath.   Gastrointestinal: Negative for bowel incontinence and heartburn.  Genitourinary: Negative for bladder incontinence and dysuria.  Musculoskeletal: Positive for arthritis, back pain and stiffness.  Neurological: Negative for weakness.  All other systems reviewed and are negative.     Objective:   Physical Exam Vitals signs reviewed.  Constitutional:      General: She is not in acute distress.    Appearance: She is well-developed.  HENT:     Head: Normocephalic and atraumatic.     Right Ear: Tympanic membrane normal.     Left Ear: Tympanic membrane normal.  Eyes:     Pupils: Pupils are equal, round, and reactive to light.  Neck:     Musculoskeletal: Normal range of motion and neck supple.     Thyroid: No thyromegaly.  Cardiovascular:     Rate and Rhythm: Normal rate and regular rhythm.     Heart sounds: Normal heart sounds. No murmur.  Pulmonary:     Effort: Pulmonary effort is normal. No respiratory distress.     Breath sounds:  Normal breath sounds. No wheezing.  Abdominal:     General: Bowel sounds are normal. There is no distension.     Palpations: Abdomen is soft.     Tenderness: There is no abdominal tenderness.  Musculoskeletal: Normal range of motion.        General: No tenderness.     Comments: Trace swelling in left knee    Skin:    General: Skin is warm and dry.  Neurological:     Mental Status: She is alert and oriented to person, place, and time.     Cranial Nerves: No cranial nerve deficit.     Deep Tendon Reflexes: Reflexes are normal and symmetric.  Psychiatric:        Behavior: Behavior normal.        Thought Content: Thought content normal.        Judgment: Judgment normal.       BP (!) 161/96   Pulse 82   Temp (!) 97.2 F (36.2 C) (Oral)   Ht '5\' 4"'$  (1.626 m)   Wt 199 lb 3.2 oz (90.4 kg)   LMP 09/13/2008 (Approximate)   BMI 34.19 kg/m      Assessment & Plan:  Veronica Gomez comes in today with chief complaint of Back Pain and Medical Management of Chronic Issues (six month recheck)   Diagnosis and orders addressed:  1. Essential hypertension -will increase losartan to 100 mg from 50 mg  -Dash diet information given -Exercise encouraged - Stress Management  -Continue current meds -RTO in 2 weeks to recheck  - CMP14+EGFR - CBC with Differential/Platelet - carvedilol (COREG) 6.25 MG tablet; Take 1 tablet (6.25 mg total) by mouth 2 (two) times daily with a meal.  Dispense: 180 tablet; Refill: 1 - hydrochlorothiazide (HYDRODIURIL) 25 MG tablet; Take 1 tablet (25 mg total) by mouth daily.  Dispense: 90 tablet; Refill: 3  2. Gastroesophageal reflux disease, esophagitis presence not specified - CMP14+EGFR - CBC with Differential/Platelet - omeprazole (PRILOSEC) 20 MG capsule; Take 1 capsule (20 mg total) by mouth daily.  Dispense: 90 capsule; Refill: 2  3. Obesity (BMI 30.0-34.9) - CMP14+EGFR - CBC with Differential/Platelet  4. Dyslipidemia - CMP14+EGFR - CBC with Differential/Platelet - Lipid panel - atorvastatin (LIPITOR) 20 MG tablet; Take 1 tablet (20 mg total) by mouth daily.  Dispense: 90 tablet; Refill: 3  5. Cardiomyopathy, ischemic - CMP14+EGFR - CBC with Differential/Platelet  6. LBBB (left bundle branch block) - CMP14+EGFR - CBC with  Differential/Platelet  7. Chronic bilateral low back pain with right-sided sciatica - CMP14+EGFR - CBC with Differential/Platelet - diclofenac (VOLTAREN) 75 MG EC tablet; Take 1 tablet (75 mg total) by mouth 2 (two) times daily.  Dispense: 60 tablet; Refill: 4  8. Osteoarthritis of left knee, unspecified osteoarthritis type - CMP14+EGFR - CBC with Differential/Platelet - diclofenac (VOLTAREN) 75 MG EC tablet; Take 1 tablet (75 mg total) by mouth 2 (two) times daily.  Dispense: 60 tablet; Refill: 4   Labs pending Health Maintenance reviewed Diet and exercise encouraged  Follow up plan: 2 weeks for HTN   [name], FNP

## 2018-06-25 LAB — CBC WITH DIFFERENTIAL/PLATELET
BASOS ABS: 0.1 10*3/uL (ref 0.0–0.2)
Basos: 1 %
EOS (ABSOLUTE): 0.3 10*3/uL (ref 0.0–0.4)
Eos: 6 %
Hematocrit: 40.6 % (ref 34.0–46.6)
Hemoglobin: 14.3 g/dL (ref 11.1–15.9)
IMMATURE GRANS (ABS): 0 10*3/uL (ref 0.0–0.1)
IMMATURE GRANULOCYTES: 0 %
LYMPHS: 33 %
Lymphocytes Absolute: 1.7 10*3/uL (ref 0.7–3.1)
MCH: 32.6 pg (ref 26.6–33.0)
MCHC: 35.2 g/dL (ref 31.5–35.7)
MCV: 93 fL (ref 79–97)
MONOS ABS: 0.5 10*3/uL (ref 0.1–0.9)
Monocytes: 10 %
NEUTROS PCT: 50 %
Neutrophils Absolute: 2.6 10*3/uL (ref 1.4–7.0)
PLATELETS: 273 10*3/uL (ref 150–450)
RBC: 4.39 x10E6/uL (ref 3.77–5.28)
RDW: 12 % (ref 11.7–15.4)
WBC: 5.2 10*3/uL (ref 3.4–10.8)

## 2018-06-25 LAB — CMP14+EGFR
A/G RATIO: 1.8 (ref 1.2–2.2)
ALK PHOS: 66 IU/L (ref 39–117)
ALT: 15 IU/L (ref 0–32)
AST: 14 IU/L (ref 0–40)
Albumin: 4.5 g/dL (ref 3.8–4.8)
BILIRUBIN TOTAL: 0.4 mg/dL (ref 0.0–1.2)
BUN/Creatinine Ratio: 17 (ref 12–28)
BUN: 15 mg/dL (ref 8–27)
CHLORIDE: 101 mmol/L (ref 96–106)
CO2: 23 mmol/L (ref 20–29)
Calcium: 9.3 mg/dL (ref 8.7–10.3)
Creatinine, Ser: 0.89 mg/dL (ref 0.57–1.00)
GFR calc Af Amer: 79 mL/min/{1.73_m2} (ref >59)
GFR calc non Af Amer: 69 mL/min/{1.73_m2} (ref >59)
GLOBULIN, TOTAL: 2.5 g/dL (ref 1.5–4.5)
Glucose: 83 mg/dL (ref 65–99)
POTASSIUM: 4.2 mmol/L (ref 3.5–5.2)
SODIUM: 140 mmol/L (ref 134–144)
Total Protein: 7 g/dL (ref 6.0–8.5)

## 2018-06-25 LAB — LIPID PANEL
CHOLESTEROL TOTAL: 162 mg/dL (ref 100–199)
Chol/HDL Ratio: 3.4 ratio (ref 0.0–4.4)
HDL: 48 mg/dL (ref >39)
LDL Calculated: 78 mg/dL (ref 0–99)
Triglycerides: 178 mg/dL — ABNORMAL HIGH (ref 0–149)
VLDL CHOLESTEROL CAL: 36 mg/dL (ref 5–40)

## 2018-07-11 ENCOUNTER — Encounter: Payer: Self-pay | Admitting: Family

## 2018-07-11 ENCOUNTER — Ambulatory Visit (INDEPENDENT_AMBULATORY_CARE_PROVIDER_SITE_OTHER): Payer: Medicare Other | Admitting: Family

## 2018-07-11 VITALS — BP 140/90 | HR 71 | Temp 98.3°F | Ht 64.0 in | Wt 200.8 lb

## 2018-07-11 DIAGNOSIS — I10 Essential (primary) hypertension: Secondary | ICD-10-CM | POA: Diagnosis not present

## 2018-07-11 DIAGNOSIS — M159 Polyosteoarthritis, unspecified: Secondary | ICD-10-CM | POA: Diagnosis not present

## 2018-07-11 NOTE — Progress Notes (Signed)
   Subjective:    Patient ID: Veronica Gomez, female    DOB: 06-19-1955, 63 y.o.   MRN: [id number]  Chief Complaint  Patient presents with  . Hypertension    two week recheck   PT presents to the office today to recheck HTN and arthritis. We increased her losartan to 100 mg from 50 mg.  Hypertension  This is a chronic problem. The current episode started more than 1 year ago. The problem has been resolved since onset. The problem is controlled. Associated symptoms include malaise/fatigue. Pertinent negatives include no peripheral edema or shortness of breath. Risk factors for coronary artery disease include obesity. The current treatment provides moderate improvement. There is no history of kidney disease.  Arthritis  Presents for follow-up visit. She complains of pain and stiffness. The symptoms have been improving. Affected locations include the right knee and left knee (back). Her pain is at a severity of 1/10.      Review of Systems  Constitutional: Positive for malaise/fatigue.  Respiratory: Negative for shortness of breath.   Musculoskeletal: Positive for arthritis and stiffness.  All other systems reviewed and are negative.      Objective:   Physical Exam Vitals signs reviewed.  Constitutional:      General: She is not in acute distress.    Appearance: She is well-developed.  HENT:     Head: Normocephalic and atraumatic.  Eyes:     Pupils: Pupils are equal, round, and reactive to light.  Neck:     Musculoskeletal: Normal range of motion and neck supple.     Thyroid: No thyromegaly.  Cardiovascular:     Rate and Rhythm: Normal rate and regular rhythm.     Heart sounds: Normal heart sounds. No murmur.  Pulmonary:     Effort: Pulmonary effort is normal. No respiratory distress.     Breath sounds: Normal breath sounds. No wheezing.  Abdominal:     General: Bowel sounds are normal. There is no distension.     Palpations: Abdomen is soft.     Tenderness: There is no  abdominal tenderness.  Musculoskeletal: Normal range of motion.        General: No tenderness.  Skin:    General: Skin is warm and dry.  Neurological:     Mental Status: She is alert and oriented to person, place, and time.     Cranial Nerves: No cranial nerve deficit.     Deep Tendon Reflexes: Reflexes are normal and symmetric.  Psychiatric:        Behavior: Behavior normal.        Thought Content: Thought content normal.        Judgment: Judgment normal.       BP 140/90   Pulse 71   Temp 98.3 F (36.8 C) (Oral)   Ht '5\' 4"'$  (1.626 m)   Wt 200 lb 12.8 oz (91.1 kg)   LMP 09/13/2008 (Approximate)   BMI 34.47 kg/m      Assessment & Plan:  [name] comes in today with chief complaint of Hypertension (two week recheck)   Diagnosis and orders addressed:  1. Essential hypertension -Dash diet information given -Exercise encouraged - Stress Management  -Continue current meds -RTO in 6 months  - BMP8+EGFR  2. Osteoarthritis of multiple joints, unspecified osteoarthritis type Continue Diclofenac BID with food No other NSAID's - BMP8+EGFR    [name], FNP

## 2018-07-11 NOTE — Patient Instructions (Signed)

## 2018-07-12 ENCOUNTER — Telehealth: Payer: Self-pay | Admitting: Family

## 2018-07-12 ENCOUNTER — Other Ambulatory Visit: Payer: Self-pay | Admitting: Family

## 2018-07-12 DIAGNOSIS — R7989 Other specified abnormal findings of blood chemistry: Secondary | ICD-10-CM

## 2018-07-12 LAB — BMP8+EGFR
BUN/Creatinine Ratio: 16 (ref 12–28)
BUN: 19 mg/dL (ref 8–27)
CALCIUM: 9.4 mg/dL (ref 8.7–10.3)
CO2: 23 mmol/L (ref 20–29)
Chloride: 103 mmol/L (ref 96–106)
Creatinine, Ser: 1.22 mg/dL — ABNORMAL HIGH (ref 0.57–1.00)
GFR calc Af Amer: 54 mL/min/{1.73_m2} — ABNORMAL LOW (ref >59)
GFR calc non Af Amer: 47 mL/min/{1.73_m2} — ABNORMAL LOW (ref >59)
Glucose: 122 mg/dL — ABNORMAL HIGH (ref 65–99)
Potassium: 3.9 mmol/L (ref 3.5–5.2)
Sodium: 143 mmol/L (ref 134–144)

## 2018-07-12 NOTE — Telephone Encounter (Signed)
Pt called and aware of labs  

## 2018-07-15 ENCOUNTER — Other Ambulatory Visit: Payer: Medicare Other

## 2018-07-15 DIAGNOSIS — R7989 Other specified abnormal findings of blood chemistry: Secondary | ICD-10-CM

## 2018-07-16 LAB — BMP8+EGFR
BUN/Creatinine Ratio: 17 (ref 12–28)
BUN: 15 mg/dL (ref 8–27)
CO2: 23 mmol/L (ref 20–29)
Calcium: 9.3 mg/dL (ref 8.7–10.3)
Chloride: 104 mmol/L (ref 96–106)
Creatinine, Ser: 0.88 mg/dL (ref 0.57–1.00)
GFR calc Af Amer: 80 mL/min/{1.73_m2} (ref >59)
GFR calc non Af Amer: 69 mL/min/{1.73_m2} (ref >59)
Glucose: 92 mg/dL (ref 65–99)
Potassium: 4.2 mmol/L (ref 3.5–5.2)
Sodium: 142 mmol/L (ref 134–144)

## 2018-08-02 ENCOUNTER — Other Ambulatory Visit: Payer: Self-pay | Admitting: Family Medicine

## 2018-08-02 DIAGNOSIS — E785 Hyperlipidemia, unspecified: Secondary | ICD-10-CM

## 2018-08-07 ENCOUNTER — Telehealth: Payer: Self-pay

## 2018-08-07 NOTE — Telephone Encounter (Signed)
Attempted to contact pt for COVID pre-screen. Left message to call back. 

## 2018-08-07 NOTE — Telephone Encounter (Signed)
   Cardiac Questionnaire:    Since your last visit or hospitalization:    1. Have you been having new or worsening chest pain? No   2. Have you been having new or worsening shortness of breath? No 3. Have you been having new or worsening leg swelling, wt gain, or increase in abdominal girth (pants fitting more tightly)? No   4. Have you had any passing out spells? No        Primary Cardiologist:  Dr. Rennis Golden  Patient contacted.  History reviewed.  No symptoms to suggest any unstable cardiac conditions.  Based on discussion, with current pandemic situation, we will be postponing this appointment for [name] with a plan for f/u in 3 month or sooner if feasible/necessary.  If symptoms change, she has been instructed to contact our office.   Routing to C19 CANCEL pool for tracking (P CV DIV CV19 CANCEL - reason for visit "other.") and assigning priority (1 = 4-6 wks, 2 = 6-12 wks, 3 = >12 wks).   Parke Poisson, RN  08/07/2018 10:09 AM         .

## 2018-08-12 ENCOUNTER — Ambulatory Visit: Payer: BLUE CROSS/BLUE SHIELD | Admitting: Internal Medicine

## 2018-10-08 DIAGNOSIS — E7800 Pure hypercholesterolemia, unspecified: Secondary | ICD-10-CM | POA: Insufficient documentation

## 2018-10-08 DIAGNOSIS — I519 Heart disease, unspecified: Secondary | ICD-10-CM | POA: Insufficient documentation

## 2018-10-15 DIAGNOSIS — H52222 Regular astigmatism, left eye: Secondary | ICD-10-CM | POA: Insufficient documentation

## 2018-10-15 DIAGNOSIS — H25812 Combined forms of age-related cataract, left eye: Secondary | ICD-10-CM | POA: Insufficient documentation

## 2018-11-01 ENCOUNTER — Other Ambulatory Visit: Payer: Self-pay | Admitting: Family

## 2018-11-01 DIAGNOSIS — I10 Essential (primary) hypertension: Secondary | ICD-10-CM

## 2018-11-06 ENCOUNTER — Other Ambulatory Visit: Payer: Self-pay | Admitting: Family

## 2018-11-06 DIAGNOSIS — G8929 Other chronic pain: Secondary | ICD-10-CM

## 2018-11-06 DIAGNOSIS — M1712 Unilateral primary osteoarthritis, left knee: Secondary | ICD-10-CM

## 2018-11-06 NOTE — Telephone Encounter (Signed)
07/11/18 OV rtc 6 mos

## 2018-11-25 ENCOUNTER — Telehealth: Payer: Self-pay | Admitting: Internal Medicine

## 2018-11-25 NOTE — Telephone Encounter (Signed)
° ° °1. Confirm consent - "In the setting of the current Covid19 crisis, you are scheduled for a (phone or video) visit with your provider on (date) at (time).  Just as we do with many in-office visits, in order for you to participate in this visit, we must obtain consent.  If you'd like, I can send this to your mychart (if signed up) or email for you to review.  Otherwise, I can obtain your verbal consent now.  All virtual visits are billed to your insurance company just like a normal visit would be.  By agreeing to a virtual visit, we'd like you to understand that the technology does not allow for your provider to perform an examination, and thus may limit your provider's ability to fully assess your condition. If your provider identifies any concerns that need to be evaluated in person, we will make arrangements to do so.  Finally, though the technology is pretty good, we cannot assure that it will always work on either your or our end, and in the setting of a video visit, we may have to convert it to a phone-only visit.  In either situation, we cannot ensure that we have a secure connection.  Are you willing to proceed?" STAFF: Did the patient verbally acknowledge consent to telehealth visit? Document YES/NO here:  Yes ° ° ° °FULL LENGTH CONSENT FOR TELE-HEALTH VISIT  ° °I hereby voluntarily request, consent and authorize [facility] and its employed or contracted physicians, physician assistants, nurse practitioners or other licensed health care professionals (the Practitioner), to provide me with telemedicine health care services (the “Services") as deemed necessary by the treating Practitioner. I acknowledge and consent to receive the Services by the Practitioner via telemedicine. I understand that the telemedicine visit will involve communicating with the Practitioner through live audiovisual communication technology and the disclosure of certain medical information by electronic transmission. I  acknowledge that I have been given the opportunity to request an in-person assessment or other available alternative prior to the telemedicine visit and am voluntarily participating in the telemedicine visit. ° °I understand that I have the right to withhold or withdraw my consent to the use of telemedicine in the course of my care at any time, without affecting my right to future care or treatment, and that the Practitioner or I may terminate the telemedicine visit at any time. I understand that I have the right to inspect all information obtained and/or recorded in the course of the telemedicine visit and may receive copies of available information for a reasonable fee.  I understand that some of the potential risks of receiving the Services via telemedicine include:  °• Delay or interruption in medical evaluation due to technological equipment failure or disruption; °• Information transmitted may not be sufficient (e.g. poor resolution of images) to allow for appropriate medical decision making by the Practitioner; and/or  °• In rare instances, security protocols could fail, causing a breach of personal health information. ° °Furthermore, I acknowledge that it is my responsibility to provide information about my medical history, conditions and care that is complete and accurate to the best of my ability. I acknowledge that Practitioner's advice, recommendations, and/or decision may be based on factors not within their control, such as incomplete or inaccurate data provided by me or distortions of diagnostic images or specimens that may result from electronic transmissions. I understand that the practice of medicine is not an exact science and that Practitioner makes no warranties or guarantees regarding treatment   outcomes. I acknowledge that I will receive a copy of this consent concurrently upon execution via email to the email address I last provided but may also request a printed copy by calling the office of  [facility].   ° °I understand that my insurance will be billed for this visit.  ° °I have read or had this consent read to me. °• I understand the contents of this consent, which adequately explains the benefits and risks of the Services being provided via telemedicine.  °• I have been provided ample opportunity to ask questions regarding this consent and the Services and have had my questions answered to my satisfaction. °• I give my informed consent for the services to be provided through the use of telemedicine in my medical care ° °By participating in this telemedicine visit I agree to the above. ° °

## 2018-11-26 ENCOUNTER — Telehealth (INDEPENDENT_AMBULATORY_CARE_PROVIDER_SITE_OTHER): Payer: Medicare Other | Admitting: Internal Medicine

## 2018-11-26 ENCOUNTER — Telehealth: Payer: Self-pay

## 2018-11-26 ENCOUNTER — Other Ambulatory Visit: Payer: Self-pay

## 2018-11-26 ENCOUNTER — Encounter: Payer: Self-pay | Admitting: Internal Medicine

## 2018-11-26 DIAGNOSIS — I255 Ischemic cardiomyopathy: Secondary | ICD-10-CM

## 2018-11-26 DIAGNOSIS — E785 Hyperlipidemia, unspecified: Secondary | ICD-10-CM | POA: Diagnosis not present

## 2018-11-26 DIAGNOSIS — I447 Left bundle-branch block, unspecified: Secondary | ICD-10-CM

## 2018-11-26 DIAGNOSIS — I10 Essential (primary) hypertension: Secondary | ICD-10-CM | POA: Diagnosis not present

## 2018-11-26 MED ORDER — ATORVASTATIN CALCIUM 40 MG PO TABS: 40.0000 mg | ORAL_TABLET | Freq: Every day | ORAL | 3 refills | Status: DC

## 2018-11-26 NOTE — Telephone Encounter (Signed)

## 2018-11-26 NOTE — Progress Notes (Signed)
Virtual Visit via Telephone Note   This visit type was conducted due to national recommendations for restrictions regarding the COVID-19 Pandemic (e.g. social distancing) in an effort to limit this patient's exposure and mitigate transmission in our community.  Due to her co-morbid illnesses, this patient is at least at moderate risk for complications without adequate follow up.  This format is felt to be most appropriate for this patient at this time.  The patient did not have access to video technology/had technical difficulties with video requiring transitioning to audio format only (telephone).  All issues noted in this document were discussed and addressed.  No physical exam could be performed with this format.  Please refer to the patient's chart for her  consent to [facility]health [facility].   Evaluation Performed:  Telephone follow-up  Date:  11/26/2018   ID:  Veronica Gomez, DOB Nov 12, 1955, MRN [id number]  Patient Location:  [address] [place] [address]  Provider location:   [address], [address] [place], [place] [address]  PCP:  Junie SpencerHawks, Christy A, FNP  Cardiologist:  No primary care provider on file. Electrophysiologist:  None   Chief Complaint:  Occasional chest discomfort  History of Present Illness:    Veronica SpeakBeverly Duzan is a 63 y.o. female who presents via audio/video conferencing for a telehealth visit today.  [name] is a pleasant 63 year old female with a history of ischemic cardiomyopathy and prior suspected infarct with an echocardiogram that showed an EF of 40% with apical inferior wall hypokinesis.  There was a fixed inferoapical defect on stress testing.  Otherwise she has had no significant anginal symptoms.  Over the past year she has had occasional episodes of chest discomfort but she says her symptoms actually improve when she exerts herself.  In February she saw her PCP and blood pressure was elevated.  Her lisinopril was increased and now her blood  pressure is well controlled.  Labs were performed at the time as well and overall were excellent including her lipid profile which had improved with a total cholesterol 162, triglycerides 178, HDL 48 and LDL of 78.  This represents a decrease from 125 for the LDL 2 years ago.  She is on atorvastatin 20 mg.  Otherwise her BMET and CBC were normal.  The patient does not have symptoms concerning for COVID-19 infection (fever, chills, cough, or new SHORTNESS OF BREATH).    Prior CV studies:   The following studies were reviewed today:  Chart reviewed Lab work  PMHx:  Past Medical History:  Diagnosis Date  . CAD (coronary artery disease)   . Cardiomyopathy (HCC)   . GERD (gastroesophageal reflux disease)   . Hyperlipidemia   . Hypertension   . Ischemia   . Left bundle branch block (LBBB)     Past Surgical History:  Procedure Laterality Date  . CERVICAL POLYPECTOMY  2010  . FINGER SURGERY  2008  . SHOULDER SURGERY Right 2014  . TUBAL LIGATION  1988    FAMHx:  Family History  Problem Relation Age of Onset  . Osteoporosis Mother   . Asthma Mother   . Hypertension Mother   . Hypertension Father   . Heart disease Father        MI    SOCHx:   reports that she quit smoking about 30 years ago. She has never used smokeless tobacco. She reports current alcohol use. She reports that she does not use drugs.  ALLERGIES:  No Known Allergies  MEDS:  Current Meds  Medication Sig  . aspirin 81 MG tablet Take 81 mg by mouth daily.  Marland Kitchen atorvastatin (LIPITOR) 20 MG tablet Take 1 tablet (20 mg total) by mouth daily.  . carvedilol (COREG) 6.25 MG tablet TAKE 1 TABLET BY MOUTH TWO  TIMES DAILY WITH A MEAL  . Cholecalciferol (VITAMIN D3) 25 MCG (1000 UT) CAPS Take by mouth daily.  . diclofenac (VOLTAREN) 75 MG EC tablet TAKE 1 TABLET BY MOUTH TWICE A DAY  . hydrochlorothiazide (HYDRODIURIL) 25 MG tablet Take 1 tablet (25 mg total) by mouth daily.  Marland Kitchen losartan (COZAAR) 100 MG tablet Take 1  tablet (100 mg total) by mouth daily.  Marland Kitchen omeprazole (PRILOSEC) 20 MG capsule Take 1 capsule (20 mg total) by mouth daily.     ROS: Pertinent items noted in HPI and remainder of comprehensive ROS otherwise negative.  Labs/Other Tests and Data Reviewed:    Recent Labs: 06/24/2018: ALT 15; Hemoglobin 14.3; Platelets 273 07/15/2018: BUN 15; Creatinine, Ser 0.88; Potassium 4.2; Sodium 142   Recent Lipid Panel Lab Results  Component Value Date/Time   CHOL 162 06/24/2018 10:34 AM   TRIG 178 (H) 06/24/2018 10:34 AM   HDL 48 06/24/2018 10:34 AM   CHOLHDL 3.4 06/24/2018 10:34 AM   LDLCALC 78 06/24/2018 10:34 AM    Wt Readings from Last 3 Encounters:  11/26/18 190 lb (86.2 kg)  07/11/18 200 lb 12.8 oz (91.1 kg)  06/24/18 199 lb 3.2 oz (90.4 kg)     Exam:    Vital Signs:  BP 128/76   Ht 5' 4.5" (1.638 m)   Wt 190 lb (86.2 kg)   LMP 09/13/2008 (Approximate)   BMI 32.11 kg/m    Exam not performed due to telephone visit  ASSESSMENT & PLAN:    1. Ischemic cardiomyopathy LVEF 40 to 50% 2. 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 Mixed dyslipidemia 4. Essential hypertension 5. LBBB  Ms. Rorke continues to do well.  She reports being asymptomatic and is done a lot of exertion and outdoor work recently.  She denies any worsening shortness of breath or chest pain.  Her weight is lower than has been in the past.  Her lipid profile accordingly is improved however still is not at goal LDL less than 70.  She is on moderate intensity atorvastatin I think would benefit from a high intensity dose at 40 mg.  I asked her to increase that we will repeat a lipid profile in about 3 months.  Otherwise plan for follow-up annually or sooner as necessary.  COVID-19 Education: The signs and symptoms of COVID-19 were discussed with the patient and how to seek care for testing (follow up with PCP or arrange E-visit).  The importance of social distancing was discussed today.  Patient Risk:   After full  review of this patients clinical status, I feel that they are at least moderate risk at this time.  Time:   Today, I have spent 25 minutes with the patient with telehealth technology discussing dyslipidemia, ischemic cardiomyopathy, chest pain, hypertension.     Medication Adjustments/Labs and Tests Ordered: Current medicines are reviewed at length with the patient today.  Concerns regarding medicines are outlined above.   Tests Ordered: No orders of the defined types were placed in this encounter.   Medication Changes: No orders of the defined types were placed in this encounter.   Disposition:  in 1 year(s)  Pixie Casino, MD, [place], [place]  Medical Director of the [facility] Diplomate of the American Board of Clinical Lipidology Attending Cardiologist  Direct Dial: [phone]  Fax: [phone]  Website:  [email or web address]  Chrystie NoseKenneth C Amram Maya, MD  11/26/2018 8:19 AM

## 2018-11-26 NOTE — Patient Instructions (Signed)
Medication Instructions:  INCREASE atorvastatin to 40mg  daily If you need a refill on your cardiac medications before your next appointment, please call your pharmacy.   Lab work: FASTING lipid panel in 3 months (mid-October) If you have labs (blood work) drawn today and your tests are completely normal, you will receive your results only by: Marland Kitchen MyChart Message (if you have MyChart) OR . A paper copy in the mail If you have any lab test that is abnormal or we need to change your treatment, we will call you to review the results.  Testing/Procedures: NONE  Follow-Up: At [facility], you and your health needs are our priority.  As part of our continuing mission to provide you with exceptional heart care, we have created designated Provider Care Teams.  These Care Teams include your primary Cardiologist (physician) and Advanced Practice Providers (APPs -  Physician Assistants and Nurse Practitioners) who all work together to provide you with the care you need, when you need it. You will need a follow up appointment in 12 months.  Please call our office 2 months in advance to schedule this appointment.  You may see Dr. Debara Pickett or one of the following Advanced Practice Providers on your designated Care Team:    Almyra Deforest, [place] or Laie, [place]

## 2018-12-11 ENCOUNTER — Ambulatory Visit: Payer: Medicare Other | Admitting: Family

## 2018-12-11 ENCOUNTER — Other Ambulatory Visit: Payer: Self-pay

## 2018-12-12 ENCOUNTER — Telehealth: Payer: Self-pay | Admitting: Family

## 2018-12-12 NOTE — Telephone Encounter (Signed)
What symptoms do you have? Bee sting on right hand. Real itchy and her hand is swollen on top and the index finger. Has tried pace with baking soda and water and then tried epson salt.   How long have you been sick? 2 days ago  Have you been seen for this problem? NO  If your provider decides to give you a prescription, which [place] [facility] [place] [facility] [place]   Patient informed that this information will be sent to the clinical staff for review and that they should receive a follow up call.

## 2018-12-12 NOTE — Telephone Encounter (Signed)
Aware.  Take benadryl as directed and call if no improvement.

## 2018-12-30 ENCOUNTER — Encounter: Payer: Self-pay | Admitting: Family

## 2018-12-30 ENCOUNTER — Other Ambulatory Visit: Payer: Self-pay

## 2018-12-30 ENCOUNTER — Ambulatory Visit (INDEPENDENT_AMBULATORY_CARE_PROVIDER_SITE_OTHER): Payer: Medicare Other | Admitting: Family

## 2018-12-30 VITALS — BP 156/97 | HR 70 | Temp 98.4°F | Ht 64.5 in | Wt 190.4 lb

## 2018-12-30 DIAGNOSIS — M1712 Unilateral primary osteoarthritis, left knee: Secondary | ICD-10-CM | POA: Diagnosis not present

## 2018-12-30 DIAGNOSIS — G8929 Other chronic pain: Secondary | ICD-10-CM

## 2018-12-30 DIAGNOSIS — M5441 Lumbago with sciatica, right side: Secondary | ICD-10-CM

## 2018-12-30 MED ORDER — DICLOFENAC SODIUM 75 MG PO TBEC: 75.0000 mg | DELAYED_RELEASE_TABLET | Freq: Two times a day (BID) | ORAL | 2 refills | Status: DC

## 2018-12-30 MED ORDER — PREDNISONE 10 MG (21) PO TBPK: ORAL_TABLET | ORAL | 0 refills | Status: DC

## 2018-12-30 MED ORDER — BACLOFEN 10 MG PO TABS: 10.0000 mg | ORAL_TABLET | Freq: Three times a day (TID) | ORAL | 0 refills | Status: DC

## 2018-12-30 NOTE — Patient Instructions (Signed)
Sciatica Rehab Ask your health care provider which exercises are safe for you. Do exercises exactly as told by your health care provider and adjust them as directed. It is normal to feel mild stretching, pulling, tightness, or discomfort as you do these exercises. Stop right away if you feel sudden pain or your pain gets worse. Do not begin these exercises until told by your health care provider. Stretching and range-of-motion exercises These exercises warm up your muscles and joints and improve the movement and flexibility of your hips and back. These exercises also help to relieve pain, numbness, and tingling. Sciatic nerve glide 1. Sit in a chair with your head facing down toward your chest. Place your hands behind your back. Let your shoulders slump forward. 2. Slowly straighten one of your legs while you tilt your head back as if you are looking toward the ceiling. Only straighten your leg as far as you can without making your symptoms worse. 3. Hold this position for __________ seconds. 4. Slowly return to the starting position. 5. Repeat with your other leg. Repeat __________ times. Complete this exercise __________ times a day. Knee to chest with hip adduction and internal rotation  1. Lie on your back on a firm surface with both legs straight. 2. Bend one of your knees and move it up toward your chest until you feel a gentle stretch in your lower back and buttock. Then, move your knee toward the shoulder that is on the opposite side from your leg. This is hip adduction and internal rotation. ? Hold your leg in this position by holding on to the front of your knee. 3. Hold this position for __________ seconds. 4. Slowly return to the starting position. 5. Repeat with your other leg. Repeat __________ times. Complete this exercise __________ times a day. Prone extension on elbows  1. Lie on your abdomen on a firm surface. A bed may be too soft for this exercise. 2. Prop yourself up on  your elbows. 3. Use your arms to help lift your chest up until you feel a gentle stretch in your abdomen and your lower back. ? This will place some of your body weight on your elbows. If this is uncomfortable, try stacking pillows under your chest. ? Your hips should stay down, against the surface that you are lying on. Keep your hip and back muscles relaxed. 4. Hold this position for __________ seconds. 5. Slowly relax your upper body and return to the starting position. Repeat __________ times. Complete this exercise __________ times a day. Strengthening exercises These exercises build strength and endurance in your back. Endurance is the ability to use your muscles for a long time, even after they get tired. Pelvic tilt This exercise strengthens the muscles that lie deep in the abdomen. 1. Lie on your back on a firm surface. Bend your knees and keep your feet flat on the floor. 2. Tense your abdominal muscles. Tip your pelvis up toward the ceiling and flatten your lower back into the floor. ? To help with this exercise, you may place a small towel under your lower back and try to push your back into the towel. 3. Hold this position for __________ seconds. 4. Let your muscles relax completely before you repeat this exercise. Repeat __________ times. Complete this exercise __________ times a day. Alternating arm and leg raises  1. Get on your hands and knees on a firm surface. If you are on a hard floor, you may want to use   padding, such as an exercise mat, to cushion your knees. 2. Line up your arms and legs. Your hands should be directly below your shoulders, and your knees should be directly below your hips. 3. Lift your left leg behind you. At the same time, raise your right arm and straighten it in front of you. ? Do not lift your leg higher than your hip. ? Do not lift your arm higher than your shoulder. ? Keep your abdominal and back muscles tight. ? Keep your hips facing the  ground. ? Do not arch your back. ? Keep your balance carefully, and do not hold your breath. 4. Hold this position for __________ seconds. 5. Slowly return to the starting position. 6. Repeat with your right leg and your left arm. Repeat __________ times. Complete this exercise __________ times a day. Posture and body mechanics Good posture and healthy body mechanics can help to relieve stress in your body's tissues and joints. Body mechanics refers to the movements and positions of your body while you do your daily activities. Posture is part of body mechanics. Good posture means:  Your spine is in its natural S-curve position (neutral).  Your shoulders are pulled back slightly.  Your head is not tipped forward. Follow these guidelines to improve your posture and body mechanics in your everyday activities. Standing   When standing, keep your spine neutral and your feet about hip width apart. Keep a slight bend in your knees. Your ears, shoulders, and hips should line up.  When you do a task in which you stand in one place for a long time, place one foot up on a stable object that is 2-4 inches (5-10 cm) high, such as a footstool. This helps keep your spine neutral. Sitting   When sitting, keep your spine neutral and keep your feet flat on the floor. Use a footrest, if necessary, and keep your thighs parallel to the floor. Avoid rounding your shoulders, and avoid tilting your head forward.  When working at a desk or a computer, keep your desk at a height where your hands are slightly lower than your elbows. Slide your chair under your desk so you are close enough to maintain good posture.  When working at a computer, place your monitor at a height where you are looking straight ahead and you do not have to tilt your head forward or downward to look at the screen. Resting  When lying down and resting, avoid positions that are most painful for you.  If you have pain with activities  such as sitting, bending, stooping, or squatting, lie in a position in which your body does not bend very much. For example, avoid curling up on your side with your arms and knees near your chest (fetal position).  If you have pain with activities such as standing for a long time or reaching with your arms, lie with your spine in a neutral position and bend your knees slightly. Try the following positions: ? Lying on your side with a pillow between your knees. ? Lying on your back with a pillow under your knees. Lifting   When lifting objects, keep your feet at least shoulder width apart and tighten your abdominal muscles.  Bend your knees and hips and keep your spine neutral. It is important to lift using the strength of your legs, not your back. Do not lock your knees straight out.  Always ask for help to lift heavy or awkward objects. This information is not   intended to replace advice given to you by your health care provider. Make sure you discuss any questions you have with your health care provider. Document Released: 05/01/2005 Document Revised: 08/23/2018 Document Reviewed: 05/23/2018 Elsevier Patient Education  2020 [employer]. Sciatica  Sciatica is pain, weakness, tingling, or loss of feeling (numbness) along the sciatic nerve. The sciatic nerve starts in the lower back and goes down the back of each leg. Sciatica usually goes away on its own or with treatment. Sometimes, sciatica may come back (recur). What are the causes? This condition happens when the sciatic nerve is pinched or has pressure put on it. This may be the result of:  A disk in between the bones of the spine bulging out too far (herniated disk).  Changes in the spinal disks that occur with aging.  A condition that affects a muscle in the butt.  Extra bone growth near the sciatic nerve.  A break (fracture) of the area between your hip bones (pelvis).  Pregnancy.  Tumor. This is rare. What increases the  risk? You are more likely to develop this condition if you:  Play sports that put pressure or stress on the spine.  Have poor strength and ease of movement (flexibility).  Have had a back injury in the past.  Have had back surgery.  Sit for long periods of time.  Do activities that involve bending or lifting over and over again.  Are very overweight (obese). What are the signs or symptoms? Symptoms can vary from mild to very bad. They may include:  Any of these problems in the lower back, leg, hip, or butt: ? Mild tingling, loss of feeling, or dull aches. ? Burning sensations. ? Sharp pains.  Loss of feeling in the back of the calf or the sole of the foot.  Leg weakness.  Very bad back pain that makes it hard to move. These symptoms may get worse when you cough, sneeze, or laugh. They may also get worse when you sit or stand for long periods of time. How is this treated? This condition often gets better without any treatment. However, treatment may include:  Changing or cutting back on physical activity when you have pain.  Doing exercises and stretching.  Putting ice or heat on the affected area.  Medicines that help: ? To relieve pain and swelling. ? To relax your muscles.  Shots (injections) of medicines that help to relieve pain, irritation, and swelling.  Surgery. Follow these instructions at home: Medicines  Take over-the-counter and prescription medicines only as told by your doctor.  Ask your doctor if the medicine prescribed to you: ? Requires you to avoid driving or using heavy machinery. ? Can cause trouble pooping (constipation). You may need to take these steps to prevent or treat trouble pooping:  Drink enough fluids to keep your pee (urine) pale yellow.  Take over-the-counter or prescription medicines.  Eat foods that are high in fiber. These include beans, whole grains, and fresh fruits and vegetables.  Limit foods that are high in fat and  sugar. These include fried or sweet foods. Managing pain      If told, put ice on the affected area. ? Put ice in a plastic bag. ? Place a towel between your skin and the bag. ? Leave the ice on for 20 minutes, 2-3 times a day.  If told, put heat on the affected area. Use the heat source that your doctor tells you to use, such as a moist  heat pack or a heating pad. ? Place a towel between your skin and the heat source. ? Leave the heat on for 20-30 minutes. ? Remove the heat if your skin turns bright red. This is very important if you are unable to feel pain, heat, or cold. You may have a greater risk of getting burned. Activity   Return to your normal activities as told by your doctor. Ask your doctor what activities are safe for you.  Avoid activities that make your symptoms worse.  Take short rests during the day. ? When you rest for a long time, do some physical activity or stretching between periods of rest. ? Avoid sitting for a long time without moving. Get up and move around at least one time each hour.  Exercise and stretch regularly, as told by your doctor.  Do not lift anything that is heavier than 10 lb (4.5 kg) while you have symptoms of sciatica. ? Avoid lifting heavy things even when you do not have symptoms. ? Avoid lifting heavy things over and over.  When you lift objects, always lift in a way that is safe for your body. To do this, you should: ? Bend your knees. ? Keep the object close to your body. ? Avoid twisting. General instructions  Stay at a healthy weight.  Wear comfortable shoes that support your feet. Avoid wearing high heels.  Avoid sleeping on a mattress that is too soft or too hard. You might have less pain if you sleep on a mattress that is firm enough to support your back.  Keep all follow-up visits as told by your doctor. This is important. Contact a doctor if:  You have pain that: ? Wakes you up when you are sleeping. ? Gets worse  when you lie down. ? Is worse than the pain you have had in the past. ? Lasts longer than 4 weeks.  You lose weight without trying. Get help right away if:  You cannot control when you pee (urinate) or poop (have a bowel movement).  You have weakness in any of these areas and it gets worse: ? Lower back. ? The area between your hip bones. ? Butt. ? Legs.  You have redness or swelling of your back.  You have a burning feeling when you pee. Summary  Sciatica is pain, weakness, tingling, or loss of feeling (numbness) along the sciatic nerve.  This condition happens when the sciatic nerve is pinched or has pressure put on it.  Sciatica can cause pain, tingling, or loss of feeling (numbness) in the lower back, legs, hips, and butt.  Treatment often includes rest, exercise, medicines, and putting ice or heat on the affected area. This information is not intended to replace advice given to you by your health care provider. Make sure you discuss any questions you have with your health care provider. Document Released: 02/08/2008 Document Revised: 05/20/2018 Document Reviewed: 05/20/2018 Elsevier Patient Education  [employer].

## 2018-12-30 NOTE — Progress Notes (Signed)
Subjective:    Patient ID: Veronica Gomez, female    DOB: 03/20/1956, 63 y.o.   MRN: [id number]  Chief Complaint  Patient presents with  . pain supine, right hip radiating down leg    Back Pain This is a new problem. The current episode started 1 to 4 weeks ago. The problem occurs intermittently. The problem has been waxing and waning since onset. The pain is present in the gluteal. The quality of the pain is described as aching. The pain radiates to the right thigh. Pain scale: 1-6 depending on what she is doing. Walking makes this pain worse. The pain is moderate. The symptoms are aggravated by standing. Associated symptoms include leg pain. Pertinent negatives include no bowel incontinence, chest pain, perianal numbness, tingling or weakness. Risk factors include obesity. She has tried NSAIDs for the symptoms. The treatment provided mild relief.      Review of Systems  Cardiovascular: Negative for chest pain.  Gastrointestinal: Negative for bowel incontinence.  Musculoskeletal: Positive for back pain.  Neurological: Negative for tingling and weakness.  All other systems reviewed and are negative.      Objective:   Physical Exam Vitals signs reviewed.  Constitutional:      General: She is not in acute distress.    Appearance: She is well-developed.  HENT:     Head: Normocephalic and atraumatic.  Neck:     Musculoskeletal: Normal range of motion and neck supple.     Thyroid: No thyromegaly.  Cardiovascular:     Rate and Rhythm: Normal rate and regular rhythm.     Heart sounds: Normal heart sounds. No murmur.  Pulmonary:     Effort: Pulmonary effort is normal. No respiratory distress.     Breath sounds: Normal breath sounds. No wheezing.  Abdominal:     General: Bowel sounds are normal. There is no distension.     Palpations: Abdomen is soft.     Tenderness: There is no abdominal tenderness.  Musculoskeletal:        General: No tenderness.     Comments: Pain in lower  lumbar radiating down right leg with flexion and extension  Skin:    General: Skin is warm and dry.  Neurological:     Mental Status: She is alert and oriented to person, place, and time.     Cranial Nerves: No cranial nerve deficit.     Deep Tendon Reflexes: Reflexes are normal and symmetric.  Psychiatric:        Behavior: Behavior normal.        Thought Content: Thought content normal.        Judgment: Judgment normal.       BP (!) 156/97   Pulse 70   Temp 98.4 F (36.9 C) (Oral)   Ht 5' 4.5" (1.638 m)   Wt 190 lb 6.4 oz (86.4 kg)   LMP 09/13/2008 (Approximate)   BMI 32.18 kg/m      Assessment & Plan:  Veronica SpeakBeverly Delashmit comes in today with chief complaint of pain supine, right hip radiating down leg   Diagnosis and orders addressed:  1. Acute right-sided low back pain with right-sided sciatica Rest Ice  ROM exercises discussed  Continue Diclofenac BID with food Sedation precautions discussed  RTO if symptoms worsen or do not improve  - baclofen (LIORESAL) 10 MG tablet; Take 1 tablet (10 mg total) by mouth 3 (three) times daily.  Dispense: 30 each; Refill: 0 - predniSONE (STERAPRED UNI-PAK 21 TAB) 10 MG (21)  TBPK tablet; Use as directed  Dispense: 21 tablet; Refill: 0    [name], FNP

## 2019-01-08 ENCOUNTER — Other Ambulatory Visit: Payer: Self-pay

## 2019-01-09 ENCOUNTER — Ambulatory Visit (INDEPENDENT_AMBULATORY_CARE_PROVIDER_SITE_OTHER): Payer: Medicare Other | Admitting: Family

## 2019-01-09 ENCOUNTER — Encounter: Payer: Self-pay | Admitting: Family

## 2019-01-09 VITALS — BP 124/86 | HR 74 | Temp 99.1°F | Ht 64.5 in | Wt 192.0 lb

## 2019-01-09 DIAGNOSIS — Z23 Encounter for immunization: Secondary | ICD-10-CM | POA: Diagnosis not present

## 2019-01-09 DIAGNOSIS — E785 Hyperlipidemia, unspecified: Secondary | ICD-10-CM

## 2019-01-09 DIAGNOSIS — K219 Gastro-esophageal reflux disease without esophagitis: Secondary | ICD-10-CM | POA: Diagnosis not present

## 2019-01-09 DIAGNOSIS — I10 Essential (primary) hypertension: Secondary | ICD-10-CM | POA: Diagnosis not present

## 2019-01-09 DIAGNOSIS — I255 Ischemic cardiomyopathy: Secondary | ICD-10-CM

## 2019-01-09 DIAGNOSIS — E669 Obesity, unspecified: Secondary | ICD-10-CM | POA: Diagnosis not present

## 2019-01-09 NOTE — Patient Instructions (Signed)

## 2019-01-09 NOTE — Progress Notes (Signed)
Subjective:    Patient ID: Veronica Gomez, female    DOB: 01-09-1956, 63 y.o.   MRN: [id number]  Chief Complaint  Patient presents with  . Medical Management of Chronic Issues    six month recheck   Pt presents to the office today for chronic follow up. She is followed by Cardiologists annually for HTN and cardiomyopathy.   Hypertension This is a chronic problem. The current episode started more than 1 year ago. The problem has been resolved since onset. The problem is controlled. Associated symptoms include chest pain. Pertinent negatives include no headaches, malaise/fatigue, peripheral edema or shortness of breath. Risk factors for coronary artery disease include dyslipidemia, obesity and sedentary lifestyle. The current treatment provides moderate improvement. There is no history of kidney disease, CAD/MI, CVA or heart failure.  Hyperlipidemia This is a chronic problem. The current episode started more than 1 year ago. The problem is controlled. Recent lipid tests were reviewed and are normal. Exacerbating diseases include obesity. Associated symptoms include chest pain. Pertinent negatives include no shortness of breath. Current antihyperlipidemic treatment includes statins. The current treatment provides moderate improvement of lipids. Risk factors for coronary artery disease include hypertension, post-menopausal, a sedentary lifestyle and family history.  Gastroesophageal Reflux She complains of chest pain and heartburn. This is a chronic problem. The current episode started more than 1 year ago. The problem occurs occasionally. The problem has been waxing and waning. Risk factors include obesity. She has tried a PPI for the symptoms. The treatment provided moderate relief.      Review of Systems  Constitutional: Negative for malaise/fatigue.  Respiratory: Negative for shortness of breath.   Cardiovascular: Positive for chest pain.  Gastrointestinal: Positive for heartburn.   Neurological: Negative for headaches.  All other systems reviewed and are negative.      Objective:   Physical Exam Vitals signs reviewed.  Constitutional:      General: She is not in acute distress.    Appearance: She is well-developed.  HENT:     Head: Normocephalic and atraumatic.     Right Ear: Tympanic membrane normal.     Left Ear: Tympanic membrane normal.  Eyes:     Pupils: Pupils are equal, round, and reactive to light.  Neck:     Musculoskeletal: Normal range of motion and neck supple.     Thyroid: No thyromegaly.  Cardiovascular:     Rate and Rhythm: Normal rate and regular rhythm.     Heart sounds: Normal heart sounds. No murmur.  Pulmonary:     Effort: Pulmonary effort is normal. No respiratory distress.     Breath sounds: Normal breath sounds. No wheezing.  Abdominal:     General: Bowel sounds are normal. There is no distension.     Palpations: Abdomen is soft.     Tenderness: There is no abdominal tenderness.  Musculoskeletal: Normal range of motion.        General: No tenderness.  Skin:    General: Skin is warm and dry.  Neurological:     Mental Status: She is alert and oriented to person, place, and time.     Cranial Nerves: No cranial nerve deficit.     Deep Tendon Reflexes: Reflexes are normal and symmetric.  Psychiatric:        Behavior: Behavior normal.        Thought Content: Thought content normal.        Judgment: Judgment normal.       BP (!) 136/92  Pulse 81   Temp 99.1 F (37.3 C) (Temporal)   Ht 5' 4.5" (1.638 m)   Wt 192 lb (87.1 kg)   LMP 09/13/2008 (Approximate)   BMI 32.45 kg/m      Assessment & Plan:  Veronica Gomez comes in today with chief complaint of Medical Management of Chronic Issues (six month recheck)   Diagnosis and orders addressed:  1. Essential hypertension - CMP14+EGFR - CBC with Differential/Platelet  2. Gastroesophageal reflux disease, esophagitis presence not specified - CMP14+EGFR - CBC with  Differential/Platelet  3. Obesity (BMI 30.0-34.9) - CMP14+EGFR - CBC with Differential/Platelet  4. Dyslipidemia - CMP14+EGFR - CBC with Differential/Platelet - Lipid panel  5. Cardiomyopathy, ischemic - CMP14+EGFR - CBC with Differential/Platelet   Labs pending Health Maintenance reviewed Diet and exercise encouraged  Follow up plan: 6 months    [name], FNP

## 2019-01-09 NOTE — Addendum Note (Signed)
Addended by: Shelbie Ammons on: 01/09/2019 09:57 AM   Modules accepted: Orders

## 2019-01-10 LAB — CBC WITH DIFFERENTIAL/PLATELET
Basophils Absolute: 0.1 10*3/uL (ref 0.0–0.2)
Basos: 1 %
EOS (ABSOLUTE): 0.4 10*3/uL (ref 0.0–0.4)
Eos: 6 %
Hematocrit: 42.3 % (ref 34.0–46.6)
Hemoglobin: 14 g/dL (ref 11.1–15.9)
Immature Grans (Abs): 0 10*3/uL (ref 0.0–0.1)
Immature Granulocytes: 0 %
Lymphocytes Absolute: 1.4 10*3/uL (ref 0.7–3.1)
Lymphs: 24 %
MCH: 32.4 pg (ref 26.6–33.0)
MCHC: 33.1 g/dL (ref 31.5–35.7)
MCV: 98 fL — ABNORMAL HIGH (ref 79–97)
Monocytes Absolute: 0.7 10*3/uL (ref 0.1–0.9)
Monocytes: 11 %
Neutrophils Absolute: 3.3 10*3/uL (ref 1.4–7.0)
Neutrophils: 58 %
Platelets: 289 10*3/uL (ref 150–450)
RBC: 4.32 x10E6/uL (ref 3.77–5.28)
RDW: 12.5 % (ref 11.7–15.4)
WBC: 5.9 10*3/uL (ref 3.4–10.8)

## 2019-01-10 LAB — CMP14+EGFR
ALT: 21 IU/L (ref 0–32)
AST: 14 IU/L (ref 0–40)
Albumin/Globulin Ratio: 2.1 (ref 1.2–2.2)
Albumin: 4.4 g/dL (ref 3.8–4.8)
Alkaline Phosphatase: 57 IU/L (ref 39–117)
BUN/Creatinine Ratio: 21 (ref 12–28)
BUN: 23 mg/dL (ref 8–27)
Bilirubin Total: 0.7 mg/dL (ref 0.0–1.2)
CO2: 27 mmol/L (ref 20–29)
Calcium: 9.4 mg/dL (ref 8.7–10.3)
Chloride: 100 mmol/L (ref 96–106)
Creatinine, Ser: 1.12 mg/dL — ABNORMAL HIGH (ref 0.57–1.00)
GFR calc Af Amer: 60 mL/min/{1.73_m2} (ref >59)
GFR calc non Af Amer: 52 mL/min/{1.73_m2} — ABNORMAL LOW (ref >59)
Globulin, Total: 2.1 g/dL (ref 1.5–4.5)
Glucose: 73 mg/dL (ref 65–99)
Potassium: 4.4 mmol/L (ref 3.5–5.2)
Sodium: 141 mmol/L (ref 134–144)
Total Protein: 6.5 g/dL (ref 6.0–8.5)

## 2019-01-10 LAB — LIPID PANEL
Chol/HDL Ratio: 3.7 ratio (ref 0.0–4.4)
Cholesterol, Total: 176 mg/dL (ref 100–199)
HDL: 47 mg/dL (ref >39)
LDL Calculated: 93 mg/dL (ref 0–99)
Triglycerides: 180 mg/dL — ABNORMAL HIGH (ref 0–149)
VLDL Cholesterol Cal: 36 mg/dL (ref 5–40)

## 2019-01-16 ENCOUNTER — Encounter: Payer: Medicare Other | Admitting: Family

## 2019-01-25 ENCOUNTER — Other Ambulatory Visit: Payer: Self-pay | Admitting: Family

## 2019-01-25 DIAGNOSIS — K219 Gastro-esophageal reflux disease without esophagitis: Secondary | ICD-10-CM

## 2019-02-10 ENCOUNTER — Other Ambulatory Visit: Payer: Self-pay | Admitting: Family

## 2019-02-10 DIAGNOSIS — G8929 Other chronic pain: Secondary | ICD-10-CM

## 2019-02-10 DIAGNOSIS — M1712 Unilateral primary osteoarthritis, left knee: Secondary | ICD-10-CM

## 2019-02-13 ENCOUNTER — Ambulatory Visit (INDEPENDENT_AMBULATORY_CARE_PROVIDER_SITE_OTHER): Payer: Medicare Other | Admitting: Family

## 2019-02-13 ENCOUNTER — Encounter: Payer: Self-pay | Admitting: Family

## 2019-02-13 ENCOUNTER — Other Ambulatory Visit: Payer: Self-pay

## 2019-02-13 VITALS — BP 136/94 | HR 80 | Temp 98.4°F | Ht 64.5 in | Wt 194.8 lb

## 2019-02-13 DIAGNOSIS — Z1212 Encounter for screening for malignant neoplasm of rectum: Secondary | ICD-10-CM | POA: Diagnosis not present

## 2019-02-13 DIAGNOSIS — Z1211 Encounter for screening for malignant neoplasm of colon: Secondary | ICD-10-CM

## 2019-02-13 DIAGNOSIS — Z0000 Encounter for general adult medical examination without abnormal findings: Secondary | ICD-10-CM

## 2019-02-13 DIAGNOSIS — Z23 Encounter for immunization: Secondary | ICD-10-CM

## 2019-02-13 NOTE — Patient Instructions (Signed)

## 2019-02-13 NOTE — Progress Notes (Signed)
Subjective:    Veronica Gomez is a 63 y.o. female who presents for a Welcome to Medicare exam.   Review of Systems Right leg pain at night.         Objective:    Today's Vitals   02/13/19 1430 02/13/19 1442 02/13/19 1451  BP: (!) 136/92 (!) 145/97 (!) 136/94  Pulse: 86 82 80  Temp: 98.4 F (36.9 C)    TempSrc: Temporal    Weight: 194 lb 12.8 oz (88.4 kg)    Height: 5' 4.5" (1.638 m)    Body mass index is 32.92 kg/m.  Medications Outpatient Encounter Medications as of 02/13/2019  Medication Sig  . aspirin 81 MG tablet Take 81 mg by mouth daily.  Marland Kitchen atorvastatin (LIPITOR) 40 MG tablet Take 1 tablet (40 mg total) by mouth daily.  . baclofen (LIORESAL) 10 MG tablet Take 1 tablet (10 mg total) by mouth 3 (three) times daily.  . carvedilol (COREG) 6.25 MG tablet TAKE 1 TABLET BY MOUTH TWO  TIMES DAILY WITH A MEAL  . Cholecalciferol (VITAMIN D3) 25 MCG (1000 UT) CAPS Take by mouth daily.  . diclofenac (VOLTAREN) 75 MG EC tablet TAKE 1 TABLET BY MOUTH TWICE A DAY  . hydrochlorothiazide (HYDRODIURIL) 25 MG tablet Take 1 tablet (25 mg total) by mouth daily.  Marland Kitchen losartan (COZAAR) 100 MG tablet Take 1 tablet (100 mg total) by mouth daily.  Marland Kitchen omeprazole (PRILOSEC) 20 MG capsule TAKE 1 CAPSULE BY MOUTH  DAILY   No facility-administered encounter medications on file as of 02/13/2019.      History: Past Medical History:  Diagnosis Date  . CAD (coronary artery disease)   . Cardiomyopathy (HCC)   . GERD (gastroesophageal reflux disease)   . Hyperlipidemia   . Hypertension   . Ischemia   . Left bundle branch block (LBBB)    Past Surgical History:  Procedure Laterality Date  . CERVICAL POLYPECTOMY  2010  . FINGER SURGERY  2008  . SHOULDER SURGERY Right 2014  . TUBAL LIGATION  1988    Family History  Problem Relation Age of Onset  . Osteoporosis Mother   . Asthma Mother   . Hypertension Mother   . Hypertension Father   . Heart disease Father        MI   Social History   Occupational History  . Not on file  Tobacco Use  . Smoking status: Former Smoker    Quit date: 05/15/1988    Years since quitting: 30.7  . Smokeless tobacco: Never Used  Substance and Sexual Activity  . Alcohol use: Yes    Comment: occasionally - beer  . Drug use: No  . Sexual activity: Yes    Birth control/protection: Post-menopausal    Tobacco Counseling Counseling given: Not Answered   Immunizations and Health Maintenance Immunization History  Administered Date(s) Administered  . Influenza Inj Mdck Quad With Preservative 02/25/2018  . Influenza,inj,Quad PF,6+ Mos 01/24/2015, 04/03/2016  . Influenza,inj,quad, With Preservative 02/24/2017  . Influenza-Unspecified 03/23/2014, 02/25/2018  . Pneumococcal Conjugate-13 01/09/2019  . Td 07/25/2017  . Tdap 07/25/2017  . Zoster 03/01/2013   There are no preventive care reminders to display for this patient.  Activities of Daily Living In your present state of health, do you have any difficulty performing the following activities: 02/13/2019  Hearing? N  Vision? N  Difficulty concentrating or making decisions? N  Walking or climbing stairs? N  Dressing or bathing? N  Doing errands, shopping? N  Some  recent data might be hidden    Physical Exam  WNL, or other factors deemed appropriate based on the beneficiary's medical and social history and current clinical standards.   Advanced Directives:  Does not have one, education provider.       Assessment:    This is a routine wellness examination for this patient . She is complaining of intermittent right leg pain that is improving. She states the pain is worse at night and states when she takes the Baclofen helps with the pain.   Vision/Hearing screen No exam data present  Dietary issues and exercise activities discussed:     Goals   None    Depression Screen PHQ 2/9 Scores 02/13/2019 01/09/2019 12/30/2018 07/11/2018  PHQ - 2 Score 0 0 0 0     Fall Risk Fall Risk   02/13/2019  Falls in the past year? 0    Cognitive Function: MMSE - Mini Mental State Exam 02/13/2019  Orientation to time 5  Orientation to Place 5  Registration 3  Attention/ Calculation 5  Recall 3  Language- name 2 objects 2  Language- repeat 1  Language- follow 3 step command 3  Language- read & follow direction 1  Write a sentence 1  Copy design 1  Total score 30        Patient Care Team: Sharion Balloon, FNP as PCP - General (Family Medicine)     Plan:    Continue medications.   I have personally reviewed and noted the following in the patient's chart:   . Medical and social history . Use of alcohol, tobacco or illicit drugs  . Current medications and supplements . Functional ability and status . Nutritional status . Physical activity . Advanced directives . List of other physicians . Hospitalizations, surgeries, and ER visits in previous 12 months . Vitals . Screenings to include cognitive, depression, and falls . Referrals and appointments  In addition, I have reviewed and discussed with patient certain preventive protocols, quality metrics, and best practice recommendations. A written personalized care plan for preventive services as well as general preventive health recommendations were provided to patient.     [name], [place] 02/13/2019

## 2019-04-17 ENCOUNTER — Other Ambulatory Visit: Payer: Self-pay | Admitting: Family

## 2019-04-17 DIAGNOSIS — I10 Essential (primary) hypertension: Secondary | ICD-10-CM

## 2019-04-21 ENCOUNTER — Ambulatory Visit (INDEPENDENT_AMBULATORY_CARE_PROVIDER_SITE_OTHER): Payer: Medicare Other | Admitting: Family

## 2019-04-21 ENCOUNTER — Encounter: Payer: Self-pay | Admitting: Family

## 2019-04-21 ENCOUNTER — Other Ambulatory Visit: Payer: Self-pay

## 2019-04-21 DIAGNOSIS — L255 Unspecified contact dermatitis due to plants, except food: Secondary | ICD-10-CM | POA: Diagnosis not present

## 2019-04-21 MED ORDER — PREDNISONE 10 MG (21) PO TBPK: ORAL_TABLET | ORAL | 0 refills | Status: DC

## 2019-04-21 NOTE — Progress Notes (Signed)
   Virtual Visit via telephone Note Due to COVID-19 pandemic this visit was conducted virtually. This visit type was conducted due to national recommendations for restrictions regarding the COVID-19 Pandemic (e.g. social distancing, sheltering in place) in an effort to limit this patient's exposure and mitigate transmission in our community. All issues noted in this document were discussed and addressed.  A physical exam was not performed with this format.  I connected with Veronica Gomez on 04/21/19 at 2:30 pm by telephone and verified that I am speaking with the correct person using two identifiers. Veronica Gomez is currently located at home and husband is currently with her during visit. The provider, [name], FNP is located in their office at time of visit.  I discussed the limitations, risks, security and privacy concerns of performing an evaluation and management service by telephone and the availability of in person appointments. I also discussed with the patient that there may be a patient responsible charge related to this service. The patient expressed understanding and agreed to proceed.   History and Present Illness:  Rash This is a new problem. The current episode started in the past 7 days. The problem has been gradually worsening since onset. The affected locations include the right hand (right wrist). The rash is characterized by itchiness, redness and blistering. She was exposed to plant contact. Past treatments include anti-itch cream. The treatment provided mild relief.      Review of Systems  Skin: Positive for rash.  All other systems reviewed and are negative.    Observations/Objective: No SOB or distress noted   Assessment and Plan: 1. Contact dermatitis due to plant Do not scratch Avoid irritants Report any s/s of infection  - predniSONE (STERAPRED UNI-PAK 21 TAB) 10 MG (21) TBPK tablet; Use as directed  Dispense: 21 tablet; Refill: 0      I discussed  the assessment and treatment plan with the patient. The patient was provided an opportunity to ask questions and all were answered. The patient agreed with the plan and demonstrated an understanding of the instructions.   The patient was advised to call back or seek an in-person evaluation if the symptoms worsen or if the condition fails to improve as anticipated.  The above assessment and management plan was discussed with the patient. The patient verbalized understanding of and has agreed to the management plan. Patient is aware to call the clinic if symptoms persist or worsen. Patient is aware when to return to the clinic for a follow-up visit. Patient educated on when it is appropriate to go to the emergency department.   Time call ended: 2:40 pm   I provided 10 minutes of non-face-to-face time during this encounter.    [name], FNP

## 2019-05-05 ENCOUNTER — Other Ambulatory Visit: Payer: Self-pay | Admitting: Family

## 2019-05-05 DIAGNOSIS — G8929 Other chronic pain: Secondary | ICD-10-CM

## 2019-05-05 DIAGNOSIS — M1712 Unilateral primary osteoarthritis, left knee: Secondary | ICD-10-CM

## 2019-07-07 ENCOUNTER — Other Ambulatory Visit: Payer: Self-pay | Admitting: Family

## 2019-07-07 DIAGNOSIS — I10 Essential (primary) hypertension: Secondary | ICD-10-CM

## 2019-07-08 NOTE — Telephone Encounter (Signed)
[facility]. Needs 6 mos appt. Refill sent to mail order pharmacy

## 2019-07-29 ENCOUNTER — Other Ambulatory Visit: Payer: Self-pay | Admitting: Family

## 2019-07-29 DIAGNOSIS — M1712 Unilateral primary osteoarthritis, left knee: Secondary | ICD-10-CM

## 2019-07-29 DIAGNOSIS — G8929 Other chronic pain: Secondary | ICD-10-CM

## 2019-07-29 DIAGNOSIS — M5441 Lumbago with sciatica, right side: Secondary | ICD-10-CM

## 2019-09-19 ENCOUNTER — Telehealth: Payer: Self-pay | Admitting: Family

## 2019-09-19 NOTE — Telephone Encounter (Signed)
Patient had J&J Monday. Thursday she started itching on elbows. Patient is taking benadryl and cream will call back if gets worse. No other symptoms.

## 2019-09-20 ENCOUNTER — Other Ambulatory Visit: Payer: Self-pay | Admitting: Internal Medicine

## 2019-09-20 DIAGNOSIS — E785 Hyperlipidemia, unspecified: Secondary | ICD-10-CM

## 2019-09-26 ENCOUNTER — Other Ambulatory Visit: Payer: Self-pay | Admitting: Family

## 2019-09-26 DIAGNOSIS — I10 Essential (primary) hypertension: Secondary | ICD-10-CM

## 2019-09-29 ENCOUNTER — Telehealth: Payer: Self-pay | Admitting: Family

## 2019-09-29 NOTE — Telephone Encounter (Signed)
[facility]. NTBS 6 mos was to be Feb. Mail order not sent

## 2019-09-29 NOTE — Telephone Encounter (Signed)
lmtcb to schedule follow up appointment

## 2019-09-29 NOTE — Telephone Encounter (Signed)
Left message to please call our office to schedule an appointment for continue refills.  She was due several months back for a six month recheck.

## 2019-10-06 ENCOUNTER — Encounter: Payer: Self-pay | Admitting: Family

## 2019-10-06 ENCOUNTER — Other Ambulatory Visit: Payer: Self-pay

## 2019-10-06 ENCOUNTER — Ambulatory Visit (INDEPENDENT_AMBULATORY_CARE_PROVIDER_SITE_OTHER): Payer: Medicare Other | Admitting: Family

## 2019-10-06 VITALS — BP 126/87 | HR 73 | Temp 98.3°F | Ht 64.5 in | Wt 195.8 lb

## 2019-10-06 DIAGNOSIS — K219 Gastro-esophageal reflux disease without esophagitis: Secondary | ICD-10-CM

## 2019-10-06 DIAGNOSIS — M5441 Lumbago with sciatica, right side: Secondary | ICD-10-CM

## 2019-10-06 DIAGNOSIS — E669 Obesity, unspecified: Secondary | ICD-10-CM

## 2019-10-06 DIAGNOSIS — I10 Essential (primary) hypertension: Secondary | ICD-10-CM | POA: Diagnosis not present

## 2019-10-06 DIAGNOSIS — E7800 Pure hypercholesterolemia, unspecified: Secondary | ICD-10-CM

## 2019-10-06 DIAGNOSIS — I255 Ischemic cardiomyopathy: Secondary | ICD-10-CM

## 2019-10-06 DIAGNOSIS — I447 Left bundle-branch block, unspecified: Secondary | ICD-10-CM

## 2019-10-06 MED ORDER — LOSARTAN POTASSIUM 100 MG PO TABS: 100.0000 mg | ORAL_TABLET | Freq: Every day | ORAL | 3 refills | Status: DC

## 2019-10-06 MED ORDER — BACLOFEN 10 MG PO TABS: 10.0000 mg | ORAL_TABLET | Freq: Three times a day (TID) | ORAL | 3 refills | Status: DC

## 2019-10-06 NOTE — Patient Instructions (Signed)
Acute Back Pain, Adult Acute back pain is sudden and usually short-lived. It is often caused by an injury to the muscles and tissues in the back. The injury may result from:  A muscle or ligament getting overstretched or torn (strained). Ligaments are tissues that connect bones to each other. Lifting something improperly can cause a back strain.  Wear and tear (degeneration) of the spinal disks. Spinal disks are circular tissue that provides cushioning between the bones of the spine (vertebrae).  Twisting motions, such as while playing sports or doing yard work.  A hit to the back.  Arthritis. You may have a physical exam, lab tests, and imaging tests to find the cause of your pain. Acute back pain usually goes away with rest and home care. Follow these instructions at home: Managing pain, stiffness, and swelling  Take over-the-counter and prescription medicines only as told by your health care provider.  Your health care provider may recommend applying ice during the first 24-48 hours after your pain starts. To do this: ? Put ice in a plastic bag. ? Place a towel between your skin and the bag. ? Leave the ice on for 20 minutes, 2-3 times a day.  If directed, apply heat to the affected area as often as told by your health care provider. Use the heat source that your health care provider recommends, such as a moist heat pack or a heating pad. ? Place a towel between your skin and the heat source. ? Leave the heat on for 20-30 minutes. ? Remove the heat if your skin turns bright red. This is especially important if you are unable to feel pain, heat, or cold. You have a greater risk of getting burned. Activity   Do not stay in bed. Staying in bed for more than 1-2 days can delay your recovery.  Sit up and stand up straight. Avoid leaning forward when you sit, or hunching over when you stand. ? If you work at a desk, sit close to it so you do not need to lean over. Keep your chin tucked  in. Keep your neck drawn back, and keep your elbows bent at a right angle. Your arms should look like the letter "L." ? Sit high and close to the steering wheel when you drive. Add lower back (lumbar) support to your car seat, if needed.  Take short walks on even surfaces as soon as you are able. Try to increase the length of time you walk each day.  Do not sit, drive, or stand in one place for more than 30 minutes at a time. Sitting or standing for long periods of time can put stress on your back.  Do not drive or use heavy machinery while taking prescription pain medicine.  Use proper lifting techniques. When you bend and lift, use positions that put less stress on your back: ? Bend your knees. ? Keep the load close to your body. ? Avoid twisting.  Exercise regularly as told by your health care provider. Exercising helps your back heal faster and helps prevent back injuries by keeping muscles strong and flexible.  Work with a physical therapist to make a safe exercise program, as recommended by your health care provider. Do any exercises as told by your physical therapist. Lifestyle  Maintain a healthy weight. Extra weight puts stress on your back and makes it difficult to have good posture.  Avoid activities or situations that make you feel anxious or stressed. Stress and anxiety increase muscle   tension and can make back pain worse. Learn ways to manage anxiety and stress, such as through exercise. General instructions  Sleep on a firm mattress in a comfortable position. Try lying on your side with your knees slightly bent. If you lie on your back, put a pillow under your knees.  Follow your treatment plan as told by your health care provider. This may include: ? Cognitive or behavioral therapy. ? Acupuncture or massage therapy. ? Meditation or yoga. Contact a health care provider if:  You have pain that is not relieved with rest or medicine.  You have increasing pain going down  into your legs or buttocks.  Your pain does not improve after 2 weeks.  You have pain at night.  You lose weight without trying.  You have a fever or chills. Get help right away if:  You develop new bowel or bladder control problems.  You have unusual weakness or numbness in your arms or legs.  You develop nausea or vomiting.  You develop abdominal pain.  You feel faint. Summary  Acute back pain is sudden and usually short-lived.  Use proper lifting techniques. When you bend and lift, use positions that put less stress on your back.  Take over-the-counter and prescription medicines and apply heat or ice as directed by your health care provider. This information is not intended to replace advice given to you by your health care provider. Make sure you discuss any questions you have with your health care provider. Document Revised: 08/20/2018 Document Reviewed: 12/13/2016 Elsevier Patient Education  2020 [employer] Inc[employer]  

## 2019-10-06 NOTE — Progress Notes (Signed)
Subjective:    Patient ID: Veronica Gomez, female    DOB: 01/31/1956, 64 y.o.   MRN: [id number]  Chief Complaint  Patient presents with  . Medical Management of Chronic Issues   Pt presents to the office today for chronic follow up. She is followed by Cardiologists annually for HTN and cardiomyopathy.   Gastroesophageal Reflux She complains of belching and heartburn. This is a chronic problem. The current episode started more than 1 year ago. The problem occurs rarely. The problem has been resolved. Risk factors include obesity. She has tried a PPI for the symptoms. The treatment provided moderate relief.  Back Pain This is a recurrent problem. The current episode started in the past 7 days. The problem occurs intermittently. The problem has been waxing and waning since onset. The pain is present in the lumbar spine. The quality of the pain is described as aching. The pain is at a severity of 5/10. The pain is moderate. The symptoms are aggravated by bending. Pertinent negatives include no leg pain. She has tried NSAIDs and muscle relaxant for the symptoms. The treatment provided mild relief.  Hypertension This is a chronic problem. The current episode started more than 1 year ago. The problem has been resolved since onset. The problem is controlled. Pertinent negatives include no malaise/fatigue, peripheral edema or shortness of breath. Risk factors for coronary artery disease include dyslipidemia, obesity and sedentary lifestyle. The current treatment provides moderate improvement.  Hyperlipidemia This is a chronic problem. The current episode started more than 1 year ago. The problem is controlled. Recent lipid tests were reviewed and are normal. Exacerbating diseases include obesity. Pertinent negatives include no leg pain or shortness of breath. Current antihyperlipidemic treatment includes statins. The current treatment provides moderate improvement of lipids. Risk factors for coronary artery  disease include dyslipidemia, hypertension and a sedentary lifestyle.      Review of Systems  Constitutional: Negative for malaise/fatigue.  Respiratory: Negative for shortness of breath.   Gastrointestinal: Positive for heartburn.  Musculoskeletal: Positive for back pain.  All other systems reviewed and are negative.      Objective:   Physical Exam Vitals reviewed.  Constitutional:      General: She is not in acute distress.    Appearance: She is well-developed.  HENT:     Head: Normocephalic and atraumatic.     Right Ear: Tympanic membrane normal.     Left Ear: Tympanic membrane normal.  Eyes:     Pupils: Pupils are equal, round, and reactive to light.  Neck:     Thyroid: No thyromegaly.  Cardiovascular:     Rate and Rhythm: Normal rate and regular rhythm.     Heart sounds: Normal heart sounds. No murmur.  Pulmonary:     Effort: Pulmonary effort is normal. No respiratory distress.     Breath sounds: Normal breath sounds. No wheezing.  Abdominal:     General: Bowel sounds are normal. There is no distension.     Palpations: Abdomen is soft.     Tenderness: There is no abdominal tenderness.  Musculoskeletal:        General: No tenderness. Normal range of motion.     Cervical back: Normal range of motion and neck supple.     Comments: Mild lumbar with flexion and extension   Skin:    General: Skin is warm and dry.  Neurological:     Mental Status: She is alert and oriented to person, place, and time.  Cranial Nerves: No cranial nerve deficit.     Deep Tendon Reflexes: Reflexes are normal and symmetric.  Psychiatric:        Behavior: Behavior normal.        Thought Content: Thought content normal.        Judgment: Judgment normal.       BP 126/87   Pulse 73   Temp 98.3 F (36.8 C) (Temporal)   Ht 5' 4.5" (1.638 m)   Wt 195 lb 12.8 oz (88.8 kg)   LMP 09/13/2008 (Approximate)   BMI 33.09 kg/m      Assessment & Plan:  Veronica Gomez comes in today with  chief complaint of Medical Management of Chronic Issues   Diagnosis and orders addressed:  1. Acute right-sided low back pain with right-sided sciatica Rest ROM exercises encouraged - baclofen (LIORESAL) 10 MG tablet; Take 1 tablet (10 mg total) by mouth 3 (three) times daily.  Dispense: 60 each; Refill: 3 - CMP14+EGFR - CBC with Differential/Platelet  2. Cardiomyopathy, ischemic - CMP14+EGFR - CBC with Differential/Platelet  3. Essential hypertension - CMP14+EGFR - CBC with Differential/Platelet  4. LBBB (left bundle branch block) - CMP14+EGFR - CBC with Differential/Platelet  5. Gastroesophageal reflux disease, unspecified whether esophagitis present - CMP14+EGFR - CBC with Differential/Platelet  6. Hypercholesterolemia - CMP14+EGFR - CBC with Differential/Platelet  7. Obesity (BMI 30.0-34.9) - CMP14+EGFR - CBC with Differential/Platelet   Labs pending Health Maintenance reviewed Diet and exercise encouraged  Follow up plan: 6 months   [name], FNP

## 2019-10-07 LAB — CMP14+EGFR
ALT: 17 IU/L (ref 0–32)
AST: 19 IU/L (ref 0–40)
Albumin/Globulin Ratio: 1.9 (ref 1.2–2.2)
Albumin: 4.5 g/dL (ref 3.8–4.8)
Alkaline Phosphatase: 59 IU/L (ref 48–121)
BUN/Creatinine Ratio: 20 (ref 12–28)
BUN: 20 mg/dL (ref 8–27)
Bilirubin Total: 0.5 mg/dL (ref 0.0–1.2)
CO2: 21 mmol/L (ref 20–29)
Calcium: 9.2 mg/dL (ref 8.7–10.3)
Chloride: 104 mmol/L (ref 96–106)
Creatinine, Ser: 1.01 mg/dL — ABNORMAL HIGH (ref 0.57–1.00)
GFR calc Af Amer: 67 mL/min/{1.73_m2} (ref >59)
GFR calc non Af Amer: 58 mL/min/{1.73_m2} — ABNORMAL LOW (ref >59)
Globulin, Total: 2.4 g/dL (ref 1.5–4.5)
Glucose: 94 mg/dL (ref 65–99)
Potassium: 3.7 mmol/L (ref 3.5–5.2)
Sodium: 141 mmol/L (ref 134–144)
Total Protein: 6.9 g/dL (ref 6.0–8.5)

## 2019-10-07 LAB — CBC WITH DIFFERENTIAL/PLATELET
Basophils Absolute: 0.1 10*3/uL (ref 0.0–0.2)
Basos: 2 %
EOS (ABSOLUTE): 0.4 10*3/uL (ref 0.0–0.4)
Eos: 9 %
Hematocrit: 40.8 % (ref 34.0–46.6)
Hemoglobin: 13.6 g/dL (ref 11.1–15.9)
Immature Grans (Abs): 0 10*3/uL (ref 0.0–0.1)
Immature Granulocytes: 0 %
Lymphocytes Absolute: 1.7 10*3/uL (ref 0.7–3.1)
Lymphs: 35 %
MCH: 32.1 pg (ref 26.6–33.0)
MCHC: 33.3 g/dL (ref 31.5–35.7)
MCV: 96 fL (ref 79–97)
Monocytes Absolute: 0.4 10*3/uL (ref 0.1–0.9)
Monocytes: 7 %
Neutrophils Absolute: 2.3 10*3/uL (ref 1.4–7.0)
Neutrophils: 47 %
Platelets: 258 10*3/uL (ref 150–450)
RBC: 4.24 x10E6/uL (ref 3.77–5.28)
RDW: 12.2 % (ref 11.7–15.4)
WBC: 4.8 10*3/uL (ref 3.4–10.8)

## 2019-10-12 ENCOUNTER — Other Ambulatory Visit: Payer: Self-pay | Admitting: Family

## 2019-10-12 DIAGNOSIS — I10 Essential (primary) hypertension: Secondary | ICD-10-CM

## 2019-10-26 ENCOUNTER — Other Ambulatory Visit: Payer: Self-pay | Admitting: Family

## 2019-10-26 DIAGNOSIS — G8929 Other chronic pain: Secondary | ICD-10-CM

## 2019-10-26 DIAGNOSIS — M1712 Unilateral primary osteoarthritis, left knee: Secondary | ICD-10-CM

## 2019-10-28 ENCOUNTER — Telehealth: Payer: Self-pay | Admitting: Family

## 2019-10-28 NOTE — Telephone Encounter (Signed)
error 

## 2019-12-01 ENCOUNTER — Ambulatory Visit: Payer: Medicare Other | Admitting: Internal Medicine

## 2019-12-01 ENCOUNTER — Encounter: Payer: Self-pay | Admitting: Internal Medicine

## 2019-12-01 ENCOUNTER — Other Ambulatory Visit: Payer: Self-pay

## 2019-12-01 VITALS — BP 140/79 | HR 59 | Temp 97.2°F | Ht 65.0 in | Wt 191.2 lb

## 2019-12-01 DIAGNOSIS — I447 Left bundle-branch block, unspecified: Secondary | ICD-10-CM

## 2019-12-01 DIAGNOSIS — I10 Essential (primary) hypertension: Secondary | ICD-10-CM

## 2019-12-01 DIAGNOSIS — E785 Hyperlipidemia, unspecified: Secondary | ICD-10-CM

## 2019-12-01 DIAGNOSIS — I255 Ischemic cardiomyopathy: Secondary | ICD-10-CM | POA: Diagnosis not present

## 2019-12-01 NOTE — Patient Instructions (Signed)
Medication Instructions:  Your physician recommends that you continue on your current medications as directed. Please refer to the Current Medication list given to you today.  *If you need a refill on your cardiac medications before your next appointment, please call your pharmacy*   Lab Work: FASTING lipid panel to check cholesterol  If you have labs (blood work) drawn today and your tests are completely normal, you will receive your results only by:  MyChart Message (if you have MyChart) OR  A paper copy in the mail If you have any lab test that is abnormal or we need to change your treatment, we will call you to review the results.   Testing/Procedures: Echocardiogram @ [address]   Follow-Up: At [facility], you and your health needs are our priority.  As part of our continuing mission to provide you with exceptional heart care, we have created designated Provider Care Teams.  These Care Teams include your primary Cardiologist (physician) and Advanced Practice Providers (APPs -  Physician Assistants and Nurse Practitioners) who all work together to provide you with the care you need, when you need it.  We recommend signing up for the patient portal called "MyChart".  Sign up information is provided on this After Visit Summary.  MyChart is used to connect with patients for Virtual Visits (Telemedicine).  Patients are able to view lab/test results, encounter notes, upcoming appointments, etc.  Non-urgent messages can be sent to your provider as well.   To learn more about what you can do with MyChart, go to [email or web address].    Your next appointment:   12 month(s)  The format for your next appointment:   In Person or Virtual  Provider:   You may see Dr. Rennis Golden or one of the following Advanced Practice Providers on your designated Care Team:    Azalee Course, PA-C  Micah Flesher, [place] or   Judy Pimple, [place]    Other Instructions

## 2019-12-01 NOTE — Progress Notes (Signed)
OFFICE NOTE  Chief Complaint:  No complaints  Primary Care Physician: Junie Spencer, FNP  HPI:  Veronica Gomez is a pleasant 64 year old female who moved to [place] in January of this year. Her past echo history is significant for hypertension, dyslipidemia and a former smoker, but quit over 25 years ago. 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 In 2009 she was noted to have an interventricular conduction delay, and was referred for stress test which was negative for ischemia. Records indicate 2013 she was having some chest discomfort and had a new, clear left bundle branch block. She underwent a nuclear stress test shows a fixed inferoapical defect. EF was 56%. An echocardiogram performed around the same time showed an EF of 40% with apical inferior wall hypokinesis. This was determined to be scar, no further workup was recommended as her symptoms were not thought to be due to ischemia. No ischemia was noted on her stress test. She is on appropriate medical therapy including aspirin, statin, ACE inhibitor, beta blocker and diuretic. She is quite active around her house and into your and denies any chest pain or worsening shortness of breath. Her weight has been stable. Her last office visit with her cardiologist was in December 2014 no changes to her medical regimen were recommended at that time. She did have a recent lipid profile which showed total cholesterol 162 and LDL of 99.  I saw Veronica Gomez back in the office today. She has no new complaints. We recently did lipid testing which shows her LDL cholesterol to be 99. That is still higher than her goal LDL of less than 70. We talked about possibly adjusting her Lipitor up however she reported she had side effects with that in the past.  Veronica Gomez returns today for follow-up. Overall she is feeling well. She denies any worsening shortness of breath or chest pain. Blood pressure has been very well  controlled. Recently she had discussed medicine changes which were planning based on her cholesterol not being quite at goal. She was concerned about myalgias on higher dose statin and therefore I recommended adding Avandia. Alternatively her primary care provider suggested she could take a double dose of Lipitor every other day. She's been doing that for at least several months and is due for recheck of her cholesterol. She currently denies any myalgias or myopathy  I had the pleasure seeing Veronica Gomez back today in the office. Slightly earlier than our normal follow-up appointment. She's changed insurance companies and is interested in starting some more exercise. She denies any chest pain but does get short of breath with activity. She was due for repeat echocardiogram which I like to get to see if she's had any change in her LV function. She is currently on aspirin, Lipitor, carvedilol and lisinopril.  08/25/2016  Veronica Gomez returns today for follow-up. Overall she is doing well. She denies any worsening shortness of breath or chest pain. She has NYHA class I symptoms. Based on this she does not qualify for Entresto. She is currently on carvedilol and lisinopril although but has been having recent cough. I think this is allergies but her PCP did write to switch her from lisinopril over to losartan. She reports some rhinitis symptoms as well as itchy eyes and no significant pollen burden now. I suggested she may benefit from using a nasal steroid and saline nasal spray. She's currently using cetirizine and diphenhydramine. She should  avoid any decongestants such as pseudoephedrine.  12/01/2019  Veronica Gomez is seen today in follow-up.  I last saw her via virtual visit during the pandemic about a year ago.  Since then she has done well.  She denies any worsening shortness of breath or chest pain.  She reports her weight has been up or down but is a few pounds lower than it was before.  She is overdue for a  repeat lipid profile.  I previously increased her atorvastatin up to 40 mg to try to reach a target LDL less than 70.  Also her last echocardiogram was in 2017 again showing an ischemic cardiomyopathy with EF of 40%.  She continues to have NYHA class I symptoms.  PMHx:  Past Medical History:  Diagnosis Date  . CAD (coronary artery disease)   . Cardiomyopathy (HCC)   . GERD (gastroesophageal reflux disease)   . Hyperlipidemia   . Hypertension   . Ischemia   . Left bundle branch block (LBBB)     Past Surgical History:  Procedure Laterality Date  . CERVICAL POLYPECTOMY  2010  . FINGER SURGERY  2008  . SHOULDER SURGERY Right 2014  . TUBAL LIGATION  1988    FAMHx:  Family History  Problem Relation Age of Onset  . Osteoporosis Mother   . Asthma Mother   . Hypertension Mother   . Hypertension Father   . Heart disease Father        MI    SOCHx:   reports that she quit smoking about 31 years ago. She has never used smokeless tobacco. She reports current alcohol use. She reports that she does not use drugs.  ALLERGIES:  No Known Allergies  ROS: Pertinent items noted in HPI and remainder of comprehensive ROS otherwise negative.  HOME MEDS: Current Outpatient Medications  Medication Sig Dispense Refill  . aspirin 81 MG tablet Take 81 mg by mouth daily.    Marland Kitchen atorvastatin (LIPITOR) 40 MG tablet Take 1 tablet (40 mg total) by mouth daily. 90 tablet 1  . baclofen (LIORESAL) 10 MG tablet Take 1 tablet (10 mg total) by mouth 3 (three) times daily. 60 each 3  . carvedilol (COREG) 6.25 MG tablet TAKE 1 TABLET BY MOUTH  TWICE DAILY WITH MEALS 180 tablet 3  . Cholecalciferol (VITAMIN D3) 25 MCG (1000 UT) CAPS Take by mouth daily.    . diclofenac (VOLTAREN) 75 MG EC tablet TAKE 1 TABLET BY MOUTH TWICE A DAY 180 tablet 0  . hydrochlorothiazide (HYDRODIURIL) 25 MG tablet TAKE 1 TABLET BY MOUTH  DAILY 90 tablet 3  . losartan (COZAAR) 100 MG tablet Take 1 tablet (100 mg total) by mouth  daily. 90 tablet 3  . omeprazole (PRILOSEC) 20 MG capsule TAKE 1 CAPSULE BY MOUTH  DAILY 90 capsule 3   No current facility-administered medications for this visit.    LABS/IMAGING: No results found for this or any previous visit (from the past 48 hour(s)). No results found.  VITALS: BP 140/79   Pulse (!) 59   Temp (!) 97.2 F (36.2 C)   Ht 5\' 5"  (1.651 m)   Wt 191 lb 3.2 oz (86.7 kg)   LMP 09/13/2008 (Approximate)   SpO2 99%   BMI 31.82 kg/m   EXAM: General appearance: alert and no distress Neck: no carotid bruit and no JVD Lungs: clear to auscultation bilaterally Heart: regular rate and rhythm, S1, S2 normal, no murmur, click, rub or gallop Abdomen: soft, non-tender; bowel sounds normal;  no masses,  no organomegaly Extremities: extremities normal, atraumatic, no cyanosis or edema Pulses: 2+ and symmetric Skin: Skin color, texture, turgor normal. No rashes or lesions Neurologic: Grossly normal Psych: Mood, affect normal  EKG: Sinus bradycardia, LBBB at 59, QRS D 160 ms-personally reviewed  ASSESSMENT: 1. Ischemic cardiomyopathy, EF between 40 and 50% 2. Fixed inferoapical defect suggestive of scar on prior nuclear stress testing 3. Chronic LBBB 4. Hypertension 5. Dyslipidemia 6. Former smoker  PLAN: 1.   Veronica Gomez has an ischemic cardiomyopathy with EF that is been between 40 and 50% however last was 40%.  She also has prior scar therefore is unlikely to expect she would have normalization of her EF but should be reassessed.  Her last study was in 2017.  She has a chronic left bundle branch block with a wide QRS, increasing her risk of possible dyssynchronous cardiomyopathy.  Plan repeat echo.  We will also repeat her lipids since I increased her atorvastatin.  She believes she is taking the higher dose but will get back to [place] on that.  Follow-up with me annually or sooner as necessary.  Chrystie Nose, MD, [facility], FACP  [facility]  Medical  Director of the [facility]  [facility] Diplomate of the American Board of Clinical Lipidology Attending Cardiologist  Direct Dial: [phone]  Fax: [phone]  Website:  [email or web address]   Veronica Gomez 12/01/2019, 1:10 PM

## 2019-12-05 ENCOUNTER — Ambulatory Visit (HOSPITAL_COMMUNITY): Payer: Medicare Other | Attending: Cardiology

## 2019-12-05 ENCOUNTER — Other Ambulatory Visit: Payer: Self-pay

## 2019-12-05 DIAGNOSIS — I255 Ischemic cardiomyopathy: Secondary | ICD-10-CM | POA: Diagnosis not present

## 2019-12-05 LAB — ECHOCARDIOGRAM COMPLETE
Area-P 1/2: 3.89 cm2
S' Lateral: 3.6 cm

## 2019-12-05 MED ORDER — PERFLUTREN LIPID MICROSPHERE
1.0000 mL | INTRAVENOUS | Status: AC | PRN
  Administered 2019-12-05: 3 mL via INTRAVENOUS

## 2019-12-08 ENCOUNTER — Other Ambulatory Visit: Payer: Medicare Other

## 2019-12-08 ENCOUNTER — Other Ambulatory Visit: Payer: Self-pay

## 2019-12-08 DIAGNOSIS — E785 Hyperlipidemia, unspecified: Secondary | ICD-10-CM | POA: Diagnosis not present

## 2019-12-09 LAB — LIPID PANEL
Chol/HDL Ratio: 3.3 ratio (ref 0.0–4.4)
Cholesterol, Total: 153 mg/dL (ref 100–199)
HDL: 46 mg/dL (ref >39)
LDL Chol Calc (NIH): 92 mg/dL (ref 0–99)
Triglycerides: 75 mg/dL (ref 0–149)
VLDL Cholesterol Cal: 15 mg/dL (ref 5–40)

## 2019-12-12 ENCOUNTER — Other Ambulatory Visit: Payer: Self-pay | Admitting: Family

## 2019-12-12 DIAGNOSIS — K219 Gastro-esophageal reflux disease without esophagitis: Secondary | ICD-10-CM

## 2020-01-07 DIAGNOSIS — Z1231 Encounter for screening mammogram for malignant neoplasm of breast: Secondary | ICD-10-CM | POA: Diagnosis not present

## 2020-01-14 ENCOUNTER — Other Ambulatory Visit: Payer: Self-pay | Admitting: *Deleted

## 2020-01-14 DIAGNOSIS — M5441 Lumbago with sciatica, right side: Secondary | ICD-10-CM

## 2020-01-14 DIAGNOSIS — G8929 Other chronic pain: Secondary | ICD-10-CM

## 2020-01-14 DIAGNOSIS — M1712 Unilateral primary osteoarthritis, left knee: Secondary | ICD-10-CM

## 2020-01-15 MED ORDER — DICLOFENAC SODIUM 75 MG PO TBEC: 75.0000 mg | DELAYED_RELEASE_TABLET | Freq: Two times a day (BID) | ORAL | 0 refills | Status: DC

## 2020-01-21 ENCOUNTER — Other Ambulatory Visit: Payer: Self-pay | Admitting: Family

## 2020-01-21 DIAGNOSIS — M1712 Unilateral primary osteoarthritis, left knee: Secondary | ICD-10-CM

## 2020-01-21 DIAGNOSIS — M5441 Lumbago with sciatica, right side: Secondary | ICD-10-CM

## 2020-01-21 DIAGNOSIS — G8929 Other chronic pain: Secondary | ICD-10-CM

## 2020-02-17 ENCOUNTER — Other Ambulatory Visit: Payer: Self-pay | Admitting: Family

## 2020-02-17 DIAGNOSIS — M1712 Unilateral primary osteoarthritis, left knee: Secondary | ICD-10-CM

## 2020-02-17 DIAGNOSIS — G8929 Other chronic pain: Secondary | ICD-10-CM

## 2020-03-03 ENCOUNTER — Other Ambulatory Visit: Payer: Self-pay | Admitting: Internal Medicine

## 2020-03-03 ENCOUNTER — Other Ambulatory Visit: Payer: Self-pay | Admitting: Family

## 2020-03-03 DIAGNOSIS — E785 Hyperlipidemia, unspecified: Secondary | ICD-10-CM

## 2020-03-03 DIAGNOSIS — K219 Gastro-esophageal reflux disease without esophagitis: Secondary | ICD-10-CM

## 2020-03-04 ENCOUNTER — Other Ambulatory Visit: Payer: Self-pay | Admitting: Family

## 2020-03-04 DIAGNOSIS — M5441 Lumbago with sciatica, right side: Secondary | ICD-10-CM

## 2020-03-04 DIAGNOSIS — G8929 Other chronic pain: Secondary | ICD-10-CM

## 2020-03-04 DIAGNOSIS — M1712 Unilateral primary osteoarthritis, left knee: Secondary | ICD-10-CM

## 2020-05-27 ENCOUNTER — Other Ambulatory Visit: Payer: Self-pay | Admitting: Family

## 2020-05-27 DIAGNOSIS — K219 Gastro-esophageal reflux disease without esophagitis: Secondary | ICD-10-CM

## 2020-05-28 NOTE — Telephone Encounter (Signed)
[facility] NTBS 6 mos ckup was to be in Nov. Mail order not sent

## 2020-06-12 ENCOUNTER — Other Ambulatory Visit: Payer: Self-pay | Admitting: Family

## 2020-06-12 DIAGNOSIS — K219 Gastro-esophageal reflux disease without esophagitis: Secondary | ICD-10-CM

## 2020-06-14 NOTE — Telephone Encounter (Signed)
Veronica Gomez NTBS mail order not sent

## 2020-06-15 NOTE — Telephone Encounter (Signed)
Appointment scheduled.

## 2020-07-08 ENCOUNTER — Encounter: Payer: Self-pay | Admitting: Family

## 2020-07-08 ENCOUNTER — Other Ambulatory Visit: Payer: Self-pay

## 2020-07-08 ENCOUNTER — Ambulatory Visit (INDEPENDENT_AMBULATORY_CARE_PROVIDER_SITE_OTHER): Payer: Medicare Other | Admitting: Family

## 2020-07-08 VITALS — BP 139/87 | HR 71 | Temp 97.9°F | Ht 65.0 in | Wt 185.6 lb

## 2020-07-08 DIAGNOSIS — H6123 Impacted cerumen, bilateral: Secondary | ICD-10-CM | POA: Diagnosis not present

## 2020-07-08 DIAGNOSIS — Z23 Encounter for immunization: Secondary | ICD-10-CM

## 2020-07-08 DIAGNOSIS — I519 Heart disease, unspecified: Secondary | ICD-10-CM | POA: Diagnosis not present

## 2020-07-08 DIAGNOSIS — E785 Hyperlipidemia, unspecified: Secondary | ICD-10-CM

## 2020-07-08 DIAGNOSIS — G8929 Other chronic pain: Secondary | ICD-10-CM | POA: Diagnosis not present

## 2020-07-08 DIAGNOSIS — E7800 Pure hypercholesterolemia, unspecified: Secondary | ICD-10-CM

## 2020-07-08 DIAGNOSIS — E669 Obesity, unspecified: Secondary | ICD-10-CM

## 2020-07-08 DIAGNOSIS — M1712 Unilateral primary osteoarthritis, left knee: Secondary | ICD-10-CM

## 2020-07-08 DIAGNOSIS — I255 Ischemic cardiomyopathy: Secondary | ICD-10-CM | POA: Diagnosis not present

## 2020-07-08 DIAGNOSIS — K219 Gastro-esophageal reflux disease without esophagitis: Secondary | ICD-10-CM

## 2020-07-08 DIAGNOSIS — I10 Essential (primary) hypertension: Secondary | ICD-10-CM

## 2020-07-08 DIAGNOSIS — M5441 Lumbago with sciatica, right side: Secondary | ICD-10-CM | POA: Diagnosis not present

## 2020-07-08 LAB — CBC WITH DIFFERENTIAL/PLATELET
Basophils Absolute: 0.1 10*3/uL (ref 0.0–0.2)
Basos: 1 %
EOS (ABSOLUTE): 0.4 10*3/uL (ref 0.0–0.4)
Eos: 9 %
Hematocrit: 40.6 % (ref 34.0–46.6)
Hemoglobin: 13.7 g/dL (ref 11.1–15.9)
Immature Grans (Abs): 0 10*3/uL (ref 0.0–0.1)
Immature Granulocytes: 0 %
Lymphocytes Absolute: 1.4 10*3/uL (ref 0.7–3.1)
Lymphs: 31 %
MCH: 32.5 pg (ref 26.6–33.0)
MCHC: 33.7 g/dL (ref 31.5–35.7)
MCV: 96 fL (ref 79–97)
Monocytes Absolute: 0.4 10*3/uL (ref 0.1–0.9)
Monocytes: 8 %
Neutrophils Absolute: 2.3 10*3/uL (ref 1.4–7.0)
Neutrophils: 51 %
Platelets: 252 10*3/uL (ref 150–450)
RBC: 4.21 x10E6/uL (ref 3.77–5.28)
RDW: 12.4 % (ref 11.7–15.4)
WBC: 4.4 10*3/uL (ref 3.4–10.8)

## 2020-07-08 LAB — CMP14+EGFR
ALT: 19 IU/L (ref 0–32)
AST: 19 IU/L (ref 0–40)
Albumin/Globulin Ratio: 2 (ref 1.2–2.2)
Albumin: 4.5 g/dL (ref 3.8–4.8)
Alkaline Phosphatase: 60 IU/L (ref 44–121)
BUN/Creatinine Ratio: 20 (ref 12–28)
BUN: 19 mg/dL (ref 8–27)
Bilirubin Total: 0.5 mg/dL (ref 0.0–1.2)
CO2: 24 mmol/L (ref 20–29)
Calcium: 9.4 mg/dL (ref 8.7–10.3)
Chloride: 104 mmol/L (ref 96–106)
Creatinine, Ser: 0.95 mg/dL (ref 0.57–1.00)
GFR calc Af Amer: 72 mL/min/{1.73_m2} (ref >59)
GFR calc non Af Amer: 62 mL/min/{1.73_m2} (ref >59)
Globulin, Total: 2.3 g/dL (ref 1.5–4.5)
Glucose: 83 mg/dL (ref 65–99)
Potassium: 4.3 mmol/L (ref 3.5–5.2)
Sodium: 143 mmol/L (ref 134–144)
Total Protein: 6.8 g/dL (ref 6.0–8.5)

## 2020-07-08 MED ORDER — DICLOFENAC SODIUM 75 MG PO TBEC: 75.0000 mg | DELAYED_RELEASE_TABLET | Freq: Two times a day (BID) | ORAL | 3 refills | Status: DC

## 2020-07-08 MED ORDER — HYDROCHLOROTHIAZIDE 25 MG PO TABS: 25.0000 mg | ORAL_TABLET | Freq: Every day | ORAL | 3 refills | Status: DC

## 2020-07-08 MED ORDER — CARVEDILOL 6.25 MG PO TABS
6.2500 mg | ORAL_TABLET | Freq: Two times a day (BID) | ORAL | 3 refills | Status: DC
Start: 2020-07-08 — End: 2021-07-21

## 2020-07-08 MED ORDER — LOSARTAN POTASSIUM 100 MG PO TABS
100.0000 mg | ORAL_TABLET | Freq: Every day | ORAL | 3 refills | Status: DC
Start: 2020-07-08 — End: 2021-01-28

## 2020-07-08 MED ORDER — ATORVASTATIN CALCIUM 40 MG PO TABS: 40.0000 mg | ORAL_TABLET | Freq: Every day | ORAL | 3 refills | Status: DC

## 2020-07-08 MED ORDER — OMEPRAZOLE 20 MG PO CPDR: 20.0000 mg | DELAYED_RELEASE_CAPSULE | Freq: Every day | ORAL | 3 refills | Status: DC

## 2020-07-08 NOTE — Progress Notes (Signed)
Subjective:    Patient ID: Veronica Gomez, female    DOB: August 25, 1955, 65 y.o.   MRN: [id number]  Chief Complaint  Patient presents with  . Medical Management of Chronic Issues   Pt presents to the office today for chronic follow up. She is followed by Cardiologists annually for HTN and cardiomyopathy. Hypertension This is a chronic problem. The current episode started more than 1 year ago. The problem has been resolved since onset. The problem is controlled. Associated symptoms include malaise/fatigue. Pertinent negatives include no peripheral edema or shortness of breath. Risk factors for coronary artery disease include dyslipidemia, obesity and sedentary lifestyle. The current treatment provides moderate improvement. There is no history of heart failure.  Gastroesophageal Reflux She complains of belching and heartburn. This is a chronic problem. The current episode started more than 1 year ago. The problem occurs occasionally. The problem has been waxing and waning. She has tried a PPI for the symptoms. The treatment provided moderate relief.  Hyperlipidemia This is a chronic problem. The current episode started more than 1 year ago. The problem is controlled. Recent lipid tests were reviewed and are normal. Pertinent negatives include no shortness of breath. Current antihyperlipidemic treatment includes statins. Risk factors for coronary artery disease include dyslipidemia, hypertension, a sedentary lifestyle and post-menopausal.      Review of Systems  Constitutional: Positive for malaise/fatigue.  Respiratory: Negative for shortness of breath.   Gastrointestinal: Positive for heartburn.  All other systems reviewed and are negative.      Objective:   Physical Exam Vitals reviewed.  Constitutional:      General: She is not in acute distress.    Appearance: She is well-developed and well-nourished.  HENT:     Head: Normocephalic and atraumatic.     Right Ear: Tympanic membrane  normal. There is impacted cerumen.     Left Ear: Tympanic membrane normal. There is impacted cerumen.     Mouth/Throat:     Mouth: Oropharynx is clear and moist.  Eyes:     Pupils: Pupils are equal, round, and reactive to light.  Neck:     Thyroid: No thyromegaly.  Cardiovascular:     Rate and Rhythm: Normal rate and regular rhythm.     Pulses: Intact distal pulses.     Heart sounds: Normal heart sounds. No murmur heard.   Pulmonary:     Effort: Pulmonary effort is normal. No respiratory distress.     Breath sounds: Normal breath sounds. No wheezing.  Abdominal:     General: Bowel sounds are normal. There is no distension.     Palpations: Abdomen is soft.     Tenderness: There is no abdominal tenderness.  Musculoskeletal:        General: No tenderness or edema. Normal range of motion.     Cervical back: Normal range of motion and neck supple.  Skin:    General: Skin is warm and dry.  Neurological:     Mental Status: She is alert and oriented to person, place, and time.     Cranial Nerves: No cranial nerve deficit.     Deep Tendon Reflexes: Reflexes are normal and symmetric.  Psychiatric:        Mood and Affect: Mood and affect normal.        Behavior: Behavior normal.        Thought Content: Thought content normal.        Judgment: Judgment normal.     Bilateral ears cleaned with warm  water and peroxide. TM WNL  BP 139/87   Pulse 71   Temp 97.9 F (36.6 C) (Temporal)   Ht $R'5\' 5"'Iu$  (1.651 m)   Wt 185 lb 9.6 oz (84.2 kg)   LMP 09/13/2008 (Approximate)   SpO2 100%   BMI 30.89 kg/m      Assessment & Plan:  Veronica Gomez comes in today with chief complaint of Medical Management of Chronic Issues   Diagnosis and orders addressed:  1. Chronic bilateral low back pain with right-sided sciatica - diclofenac (VOLTAREN) 75 MG EC tablet; Take 1 tablet (75 mg total) by mouth 2 (two) times daily.  Dispense: 180 tablet; Refill: 3 - CMP14+EGFR - CBC with  Differential/Platelet  2. Osteoarthritis of left knee, unspecified osteoarthritis type - diclofenac (VOLTAREN) 75 MG EC tablet; Take 1 tablet (75 mg total) by mouth 2 (two) times daily.  Dispense: 180 tablet; Refill: 3 - CMP14+EGFR - CBC with Differential/Platelet  3. Dyslipidemia - atorvastatin (LIPITOR) 40 MG tablet; Take 1 tablet (40 mg total) by mouth daily.  Dispense: 90 tablet; Refill: 3 - CMP14+EGFR - CBC with Differential/Platelet  4. Essential hypertension - carvedilol (COREG) 6.25 MG tablet; Take 1 tablet (6.25 mg total) by mouth 2 (two) times daily with a meal.  Dispense: 180 tablet; Refill: 3 - hydrochlorothiazide (HYDRODIURIL) 25 MG tablet; Take 1 tablet (25 mg total) by mouth daily.  Dispense: 90 tablet; Refill: 3 - losartan (COZAAR) 100 MG tablet; Take 1 tablet (100 mg total) by mouth daily.  Dispense: 90 tablet; Refill: 3 - CMP14+EGFR - CBC with Differential/Platelet  5. Gastroesophageal reflux disease - omeprazole (PRILOSEC) 20 MG capsule; Take 1 capsule (20 mg total) by mouth daily.  Dispense: 90 capsule; Refill: 3 - CMP14+EGFR - CBC with Differential/Platelet  6. Primary hypertension - CMP14+EGFR - CBC with Differential/Platelet  7. Cardiomyopathy, ischemic - CMP14+EGFR - CBC with Differential/Platelet  8. Chronic heart disease - CMP14+EGFR - CBC with Differential/Platelet  9. Obesity (BMI 30.0-34.9) - CMP14+EGFR - CBC with Differential/Platelet  10. Hypercholesterolemia - CMP14+EGFR - CBC with Differential/Platelet  11. Bilateral impacted cerumen Cleaned today Use OTC drops as needed    Labs pending Health Maintenance reviewed Diet and exercise encouraged  Follow up plan: 6 months    [name], FNP

## 2020-07-08 NOTE — Patient Instructions (Addendum)
Health Maintenance After Age 67 After age 64, you are at a higher risk for certain long-term diseases and infections as well as injuries from falls. Falls are a major cause of broken bones and head injuries in people who are older than age 41. Getting regular preventive care can help to keep you healthy and well. Preventive care includes getting regular testing and making lifestyle changes as recommended by your health care provider. Talk with your health care provider about:  Which screenings and tests you should have. A screening is a test that checks for a disease when you have no symptoms.  A diet and exercise plan that is right for you. What should I know about screenings and tests to prevent falls? Screening and testing are the best ways to find a health problem early. Early diagnosis and treatment give you the best chance of managing medical conditions that are common after age 19. Certain conditions and lifestyle choices may make you more likely to have a fall. Your health care provider may recommend:  Regular vision checks. Poor vision and conditions such as cataracts can make you more likely to have a fall. If you wear glasses, make sure to get your prescription updated if your vision changes.  Medicine review. Work with your health care provider to regularly review all of the medicines you are taking, including over-the-counter medicines. Ask your health care provider about any side effects that may make you more likely to have a fall. Tell your health care provider if any medicines that you take make you feel dizzy or sleepy.  Osteoporosis screening. Osteoporosis is a condition that causes the bones to get weaker. This can make the bones weak and cause them to break more easily.  Blood pressure screening. Blood pressure changes and medicines to control blood pressure can make you feel dizzy.  Strength and balance checks. Your health care provider may recommend certain tests to check your  strength and balance while standing, walking, or changing positions.  Foot health exam. Foot pain and numbness, as well as not wearing proper footwear, can make you more likely to have a fall.  Depression screening. You may be more likely to have a fall if you have a fear of falling, feel emotionally low, or feel unable to do activities that you used to do.  Alcohol use screening. Using too much alcohol can affect your balance and may make you more likely to have a fall. What actions can I take to lower my risk of falls? General instructions  Talk with your health care provider about your risks for falling. Tell your health care provider if: ? You fall. Be sure to tell your health care provider about all falls, even ones that seem minor. ? You feel dizzy, sleepy, or off-balance.  Take over-the-counter and prescription medicines only as told by your health care provider. These include any supplements.  Eat a healthy diet and maintain a healthy weight. A healthy diet includes low-fat dairy products, low-fat (lean) meats, and fiber from whole grains, beans, and lots of fruits and vegetables. Home safety  Remove any tripping hazards, such as rugs, cords, and clutter.  Install safety equipment such as grab bars in bathrooms and safety rails on stairs.  Keep rooms and walkways well-lit. Activity  Follow a regular exercise program to stay fit. This will help you maintain your balance. Ask your health care provider what types of exercise are appropriate for you.  If you need a cane or walker,  use it as recommended by your health care provider.  Wear supportive shoes that have nonskid soles.   Lifestyle  Do not drink alcohol if your health care provider tells you not to drink.  If you drink alcohol, limit how much you have: ? 0-1 drink a day for women. ? 0-2 drinks a day for men.  Be aware of how much alcohol is in your drink. In the [place] one drink equals one typical bottle of beer (12  oz), one-half glass of wine (5 oz), or one shot of hard liquor (1 oz).  Do not use any products that contain nicotine or tobacco, such as cigarettes and e-cigarettes. If you need help quitting, ask your health care provider. Summary  Having a healthy lifestyle and getting preventive care can help to protect your health and wellness after age 72.  Screening and testing are the best way to find a health problem early and help you avoid having a fall. Early diagnosis and treatment give you the best chance for managing medical conditions that are more common for people who are older than age 63.  Falls are a major cause of broken bones and head injuries in people who are older than age 57. Take precautions to prevent a fall at home.  Work with your health care provider to learn what changes you can make to improve your health and wellness and to prevent falls. This information is not intended to replace advice given to you by your health care provider. Make sure you discuss any questions you have with your health care provider. Document Revised: 08/22/2018 Document Reviewed: 03/14/2017 Elsevier Patient Education  2021 Elsevier Inc.   Earwax Buildup, Adult The ears produce a substance called earwax that helps keep bacteria out of the ear and protects the skin in the ear canal. Occasionally, earwax can build up in the ear and cause discomfort or hearing loss. What are the causes? This condition is caused by a buildup of earwax. Ear canals are self-cleaning. Ear wax is made in the outer part of the ear canal and generally falls out in small amounts over time. When the self-cleaning mechanism is not working, earwax builds up and can cause decreased hearing and discomfort. Attempting to clean ears with cotton swabs can push the earwax deep into the ear canal and cause decreased hearing and pain. What increases the risk? This condition is more likely to develop in people who:  Clean their ears often  with cotton swabs.  Pick at their ears.  Use earplugs or in-ear headphones often, or wear hearing aids. The following factors may also make you more likely to develop this condition:  Being female.  Being of older age.  Naturally producing more earwax.  Having narrow ear canals.  Having earwax that is overly thick or sticky.  Having excess hair in the ear canal.  Having eczema.  Being dehydrated. What are the signs or symptoms? Symptoms of this condition include:  Reduced or muffled hearing.  A feeling of fullness in the ear or feeling that the ear is plugged.  Fluid coming from the ear.  Ear pain or an itchy ear.  Ringing in the ear.  Coughing.  Balance problems.  An obvious piece of earwax that can be seen inside the ear canal. How is this diagnosed? This condition may be diagnosed based on:  Your symptoms.  Your medical history.  An ear exam. During the exam, your health care provider will look into your ear with  an instrument called an otoscope. You may have tests, including a hearing test. How is this treated? This condition may be treated by:  Using ear drops to soften the earwax.  Having the earwax removed by a health care provider. The health care provider may: ? Flush the ear with water. ? Use an instrument that has a loop on the end (curette). ? Use a suction device.  Having surgery to remove the wax buildup. This may be done in severe cases. Follow these instructions at home:  Take over-the-counter and prescription medicines only as told by your health care provider.  Do not put any objects, including cotton swabs, into your ear. You can clean the opening of your ear canal with a washcloth or facial tissue.  Follow instructions from your health care provider about cleaning your ears. Do not overclean your ears.  Drink enough fluid to keep your urine pale yellow. This will help to thin the earwax.  Keep all follow-up visits as told. If  earwax builds up in your ears often or if you use hearing aids, consider seeing your health care provider for routine, preventive ear cleanings. Ask your health care provider how often you should schedule your cleanings.  If you have hearing aids, clean them according to instructions from the manufacturer and your health care provider.   Contact a health care provider if:  You have ear pain.  You develop a fever.  You have pus or other fluid coming from your ear.  You have hearing loss.  You have ringing in your ears that does not go away.  You feel like the room is spinning (vertigo).  Your symptoms do not improve with treatment. Get help right away if:  You have bleeding from the affected ear.  You have severe ear pain. Summary  Earwax can build up in the ear and cause discomfort or hearing loss.  The most common symptoms of this condition include reduced or muffled hearing, a feeling of fullness in the ear, or feeling that the ear is plugged.  This condition may be diagnosed based on your symptoms, your medical history, and an ear exam.  This condition may be treated by using ear drops to soften the earwax or by having the earwax removed by a health care provider.  Do not put any objects, including cotton swabs, into your ear. You can clean the opening of your ear canal with a washcloth or facial tissue. This information is not intended to replace advice given to you by your health care provider. Make sure you discuss any questions you have with your health care provider. Document Revised: 08/19/2019 Document Reviewed: 08/19/2019 Elsevier Patient Education  2021 [employer].

## 2020-08-09 ENCOUNTER — Ambulatory Visit (INDEPENDENT_AMBULATORY_CARE_PROVIDER_SITE_OTHER): Payer: Medicare Other

## 2020-08-09 DIAGNOSIS — Z0000 Encounter for general adult medical examination without abnormal findings: Secondary | ICD-10-CM

## 2020-08-09 NOTE — Progress Notes (Signed)
MEDICARE ANNUAL WELLNESS VISIT  08/09/2020  Telephone Visit Disclaimer This Medicare AWV was conducted by telephone due to national recommendations for restrictions regarding the COVID-19 Pandemic (e.g. social distancing).  I verified, using two identifiers, that I am speaking with Veronica Gomez or their authorized healthcare agent. I discussed the limitations, risks, security, and privacy concerns of performing an evaluation and management service by telephone and the potential availability of an in-person appointment in the future. The patient expressed understanding and agreed to proceed.  Location of Patient: Home Location of Provider (nurse):  [facility]  Subjective:    Veronica Gomez is a 65 y.o. female patient of Hawks, Edilia Bo, FNP who had a Medicare Annual Wellness Visit today via telephone. [name] is Retired and lives with their spouse. she has two children. she reports that she is socially active and does interact with friends/family regularly. she is minimally physically active.  Patient Care Team: Junie Spencer, FNP as PCP - General (Family Medicine) Chrystie Nose, MD as PCP - Cardiology (Cardiology)  Advanced Directives 08/09/2020  Does Patient Have a Medical Advance Directive? Yes  Type of Estate agent of [place];Living will  Does patient want to make changes to medical advance directive? No - Patient declined    Hospital Utilization Over the Past 12 Months: # of hospitalizations or ER visits: 0 # of surgeries: 0  Review of Systems    Patient reports that her overall health is unchanged compared to last year.   Patient Reported Readings (BP, Pulse, CBG, Weight, etc) none  Pain Assessment Pain : No/denies pain     Current Medications & Allergies (verified) Allergies as of 08/09/2020   No Known Allergies     Medication List       Accurate as of August 09, 2020  9:03 AM. If you have any questions,  ask your nurse or doctor.        aspirin 81 MG tablet Take 81 mg by mouth daily.   atorvastatin 40 MG tablet Commonly known as: LIPITOR Take 1 tablet (40 mg total) by mouth daily.   carvedilol 6.25 MG tablet Commonly known as: COREG Take 1 tablet (6.25 mg total) by mouth 2 (two) times daily with a meal.   diclofenac 75 MG EC tablet Commonly known as: VOLTAREN Take 1 tablet (75 mg total) by mouth 2 (two) times daily.   hydrochlorothiazide 25 MG tablet Commonly known as: HYDRODIURIL Take 1 tablet (25 mg total) by mouth daily.   losartan 100 MG tablet Commonly known as: COZAAR Take 1 tablet (100 mg total) by mouth daily.   omeprazole 20 MG capsule Commonly known as: PRILOSEC Take 1 capsule (20 mg total) by mouth daily.   Vitamin D3 25 MCG (1000 UT) Caps Take by mouth daily.       History (reviewed): Past Medical History:  Diagnosis Date  . CAD (coronary artery disease)   . Cardiomyopathy (HCC)   . GERD (gastroesophageal reflux disease)   . Hyperlipidemia   . Hypertension   . Ischemia   . Left bundle branch block (LBBB)    Past Surgical History:  Procedure Laterality Date  . CERVICAL POLYPECTOMY  2010  . FINGER SURGERY  2008  . SHOULDER SURGERY Right 2014  . TUBAL LIGATION  1988   Family History  Problem Relation Age of Onset  . Osteoporosis Mother   . Asthma Mother   . Hypertension Mother   . Hypertension Father   .  Heart disease Father        MI   Social History   Socioeconomic History  . Marital status: Married    Spouse name: Not on file  . Number of children: Not on file  . Years of education: Not on file  . Highest education level: Not on file  Occupational History  . Not on file  Tobacco Use  . Smoking status: Former Smoker    Quit date: 05/15/1988    Years since quitting: 32.2  . Smokeless tobacco: Never Used  Vaping Use  . Vaping Use: Never used  Substance and Sexual Activity  . Alcohol use: Yes    Comment: occasionally - beer  .  Drug use: No  . Sexual activity: Yes    Birth control/protection: Post-menopausal  Other Topics Concern  . Not on file  Social History Narrative  . Not on file   Social Determinants of Health   Financial Resource Strain: Not on file  Food Insecurity: Not on file  Transportation Needs: Not on file  Physical Activity: Not on file  Stress: Not on file  Social Connections: Not on file    Activities of Daily Living In your present state of health, do you have any difficulty performing the following activities: 08/09/2020  Hearing? N  Vision? N  Difficulty concentrating or making decisions? N  Walking or climbing stairs? N  Dressing or bathing? N  Doing errands, shopping? N  Preparing Food and eating ? N  Using the Toilet? N  In the past six months, have you accidently leaked urine? N  Do you have problems with loss of bowel control? N  Managing your Medications? N  Managing your Finances? N  Housekeeping or managing your Housekeeping? N  Some recent data might be hidden    Patient Education/ Literacy How often do you need to have someone help you when you read instructions, pamphlets, or other written materials from your doctor or pharmacy?: 1 - Never What is the last grade level you completed in school?: High School  Exercise Current Exercise Habits: The patient does not participate in regular exercise at present  Diet Patient reports consuming 3 meals a day and 2 snack(s) a day Patient reports that her primary diet is: Regular Patient reports that she does have regular access to food.   Depression Screen PHQ 2/9 Scores 08/09/2020 07/08/2020 10/06/2019 02/13/2019 01/09/2019 12/30/2018 07/11/2018  PHQ - 2 Score 0 0 0 0 0 0 0     Fall Risk Fall Risk  08/09/2020 10/06/2019 02/13/2019 01/09/2019 12/30/2018  Falls in the past year? 0 0 0 0 0     Objective:  Veronica Gomez seemed alert and oriented and she participated appropriately during our telephone visit.  Blood Pressure  Weight BMI  BP Readings from Last 3 Encounters:  07/08/20 139/87  12/01/19 140/79  10/06/19 126/87   Wt Readings from Last 3 Encounters:  07/08/20 185 lb 9.6 oz (84.2 kg)  12/01/19 191 lb 3.2 oz (86.7 kg)  10/06/19 195 lb 12.8 oz (88.8 kg)   BMI Readings from Last 1 Encounters:  07/08/20 30.89 kg/m    *Unable to obtain current vital signs, weight, and BMI due to telephone visit type  Hearing/Vision  . Salote did not seem to have difficulty with hearing/understanding during the telephone conversation . Reports that she has not had a formal eye exam by an eye care professional within the past year . Reports that she has not had a formal hearing  evaluation within the past year *Unable to fully assess hearing and vision during telephone visit type  Cognitive Function: 6CIT Screen 08/09/2020  What Year? 0 points  What month? 0 points  What time? 0 points  Count back from 20 0 points  Months in reverse 0 points  Repeat phrase 0 points  Total Score 0   (Normal:0-7, Significant for Dysfunction: >8)  Normal Cognitive Function Screening: Yes   Immunization & Health Maintenance Record Immunization History  Administered Date(s) Administered  . Fluad Quad(high Dose 65+) 02/13/2019  . Influenza Inj Mdck Quad With Preservative 02/25/2018  . Influenza,inj,Quad PF,6+ Mos 01/24/2015, 04/03/2016  . Influenza,inj,quad, With Preservative 02/24/2017  . Influenza-Unspecified 03/23/2014, 02/25/2018  . Janssen (J&J) SARS-COV-2 Vaccination 09/15/2019  . Pneumococcal Conjugate-13 01/09/2019  . Pneumococcal Polysaccharide-23 07/08/2020  . Td 07/25/2017  . Tdap 07/25/2017  . Zoster 03/01/2013    Health Maintenance  Topic Date Due  . COVID-19 Vaccine (2 - Booster for [employer] series) 11/10/2019  . INFLUENZA VACCINE  08/12/2020 (Originally 12/14/2019)  . COLONOSCOPY (Pts 45-43yrs Insurance coverage will need to be confirmed)  05/18/2022 (Originally 12/27/2014)  . MAMMOGRAM  10/08/2020  .  TETANUS/TDAP  07/26/2027  . DEXA SCAN  Completed  . Hepatitis C Screening  Completed  . PNA vac Low Risk Adult  Completed  . HPV VACCINES  Aged Out       Assessment  This is a routine wellness examination for Veronica Gomez.  Health Maintenance: Due or Overdue Health Maintenance Due  Topic Date Due  . COVID-19 Vaccine (2 - Booster for [employer] series) 11/10/2019    Veronica Gomez does not need a referral for Community Assistance: Care Management:   no Social Work:    no Prescription Assistance:  no Nutrition/Diabetes Education:  no   Plan:  Personalized Goals Goals Addressed            This Visit's Progress   . Exercise 150 min/wk Moderate Activity        Personalized Health Maintenance & Screening Recommendations  Covid Booster  Lung Cancer Screening Recommended: no (Low Dose CT Chest recommended if Age 67-80 years, 30 pack-year currently smoking OR have quit w/in past 15 years) Hepatitis C Screening recommended: no HIV Screening recommended: no  Advanced Directives: Written information was not prepared per patient's request.  Referrals & Orders No orders of the defined types were placed in this encounter.   Follow-up Plan . Follow-up with Junie Spencer, FNP as planned . Schedule 01/05/21   I have personally reviewed and noted the following in the patient's chart:   . Medical and social history . Use of alcohol, tobacco or illicit drugs  . Current medications and supplements . Functional ability and status . Nutritional status . Physical activity . Advanced directives . List of other physicians . Hospitalizations, surgeries, and ER visits in previous 12 months . Vitals . Screenings to include cognitive, depression, and falls . Referrals and appointments  In addition, I have reviewed and discussed with Veronica Gomez certain preventive protocols, quality metrics, and best practice recommendations. A written personalized care plan for preventive  services as well as general preventive health recommendations is available and can be mailed to the patient at her request.      Suzan Slick [facility]  [phone]/[id number]

## 2020-08-09 NOTE — Patient Instructions (Addendum)
  MEDICARE ANNUAL WELLNESS VISIT Health Maintenance Summary and Written Plan of Care  Ms. Hiscox ,  Thank you for allowing me to perform your Medicare Annual Wellness Visit and for your ongoing commitment to your health.   Health Maintenance & Immunization History Health Maintenance  Topic Date Due  . COVID-19 Vaccine (2 - Booster for [employer] series) 11/10/2019  . INFLUENZA VACCINE  08/12/2020 (Originally 12/14/2019)  . COLONOSCOPY (Pts 45-3yrs Insurance coverage will need to be confirmed)  05/18/2022 (Originally 12/27/2014)  . MAMMOGRAM  10/08/2020  . TETANUS/TDAP  07/26/2027  . DEXA SCAN  Completed  . Hepatitis C Screening  Completed  . PNA vac Low Risk Adult  Completed  . HPV VACCINES  Aged Out   Immunization History  Administered Date(s) Administered  . Fluad Quad(high Dose 65+) 02/13/2019  . Influenza Inj Mdck Quad With Preservative 02/25/2018  . Influenza,inj,Quad PF,6+ Mos 01/24/2015, 04/03/2016  . Influenza,inj,quad, With Preservative 02/24/2017  . Influenza-Unspecified 03/23/2014, 02/25/2018  . Janssen (J&J) SARS-COV-2 Vaccination 09/15/2019  . Pneumococcal Conjugate-13 01/09/2019  . Pneumococcal Polysaccharide-23 07/08/2020  . Td 07/25/2017  . Tdap 07/25/2017  . Zoster 03/01/2013    These are the patient goals that we discussed: Goals Addressed            This Visit's Progress   . Exercise 150 min/wk Moderate Activity          This is a list of Health Maintenance Items that are overdue or due now: Health Maintenance Due  Topic Date Due  . COVID-19 Vaccine (2 - Booster for [employer] series) 11/10/2019     Orders/Referrals Placed Today: No orders of the defined types were placed in this encounter.  (Contact our referral department at [phone] if you have not spoken with someone about your referral appointment within the next 5 days)    Follow-up Plan   Scheduled with Jannifer Rodney, FNP on 01/05/21 at 8:40am.

## 2020-09-15 ENCOUNTER — Encounter: Payer: Self-pay | Admitting: Family Medicine

## 2021-01-05 ENCOUNTER — Other Ambulatory Visit: Payer: Self-pay

## 2021-01-05 ENCOUNTER — Encounter: Payer: Self-pay | Admitting: Family

## 2021-01-05 ENCOUNTER — Ambulatory Visit (INDEPENDENT_AMBULATORY_CARE_PROVIDER_SITE_OTHER): Payer: Medicare Other | Admitting: Family

## 2021-01-05 VITALS — BP 156/88 | HR 63 | Temp 97.8°F | Ht 65.0 in | Wt 178.6 lb

## 2021-01-05 DIAGNOSIS — I10 Essential (primary) hypertension: Secondary | ICD-10-CM

## 2021-01-05 DIAGNOSIS — Z1211 Encounter for screening for malignant neoplasm of colon: Secondary | ICD-10-CM

## 2021-01-05 DIAGNOSIS — K219 Gastro-esophageal reflux disease without esophagitis: Secondary | ICD-10-CM | POA: Diagnosis not present

## 2021-01-05 DIAGNOSIS — E669 Obesity, unspecified: Secondary | ICD-10-CM

## 2021-01-05 DIAGNOSIS — E785 Hyperlipidemia, unspecified: Secondary | ICD-10-CM | POA: Diagnosis not present

## 2021-01-05 DIAGNOSIS — I519 Heart disease, unspecified: Secondary | ICD-10-CM | POA: Diagnosis not present

## 2021-01-05 NOTE — Patient Instructions (Signed)

## 2021-01-05 NOTE — Progress Notes (Signed)
Subjective:    Patient ID: Veronica Gomez, female    DOB: [birth year], 65 y.o.   MRN: [id number]  Chief Complaint  Patient presents with   Medical Management of Chronic Issues   Pt presents to the office today for chronic follow up. She is followed by Cardiologists annually for HTN and cardiomyopathy. She is very stressed today because her husband is an alcoholic.  Hypertension This is a chronic problem. The current episode started more than 1 year ago. The problem has been waxing and waning since onset. The problem is uncontrolled. Associated symptoms include malaise/fatigue. Pertinent negatives include no peripheral edema or shortness of breath. Risk factors for coronary artery disease include dyslipidemia and obesity. The current treatment provides moderate improvement.  Gastroesophageal Reflux She complains of belching and heartburn. She reports no choking or no coughing. This is a chronic problem. The current episode started more than 1 year ago. The problem occurs occasionally. Risk factors include obesity. She has tried a PPI for the symptoms. The treatment provided moderate relief.  Hyperlipidemia This is a chronic problem. The current episode started more than 1 year ago. Exacerbating diseases include obesity. She has no history of hypothyroidism. Pertinent negatives include no shortness of breath. Current antihyperlipidemic treatment includes statins. The current treatment provides moderate improvement of lipids. Risk factors for coronary artery disease include dyslipidemia, hypertension, a sedentary lifestyle and post-menopausal.     Review of Systems  Constitutional:  Positive for malaise/fatigue.  Respiratory:  Negative for cough, choking and shortness of breath.   Gastrointestinal:  Positive for heartburn.  All other systems reviewed and are negative.     Objective:   Physical Exam Vitals reviewed.  Constitutional:      General: She is not in acute distress.    Appearance:  She is well-developed. She is obese.  HENT:     Head: Normocephalic and atraumatic.     Right Ear: Tympanic membrane normal.     Left Ear: Tympanic membrane normal.  Eyes:     Pupils: Pupils are equal, round, and reactive to light.  Neck:     Thyroid: No thyromegaly.  Cardiovascular:     Rate and Rhythm: Normal rate and regular rhythm.     Heart sounds: Normal heart sounds. No murmur heard. Pulmonary:     Effort: Pulmonary effort is normal. No respiratory distress.     Breath sounds: Normal breath sounds. No wheezing.  Abdominal:     General: Bowel sounds are normal. There is no distension.     Palpations: Abdomen is soft.     Tenderness: There is no abdominal tenderness.  Musculoskeletal:        General: No tenderness. Normal range of motion.     Cervical back: Normal range of motion and neck supple.  Skin:    General: Skin is warm and dry.  Neurological:     Mental Status: She is alert and oriented to person, place, and time.     Cranial Nerves: No cranial nerve deficit.     Deep Tendon Reflexes: Reflexes are normal and symmetric.  Psychiatric:        Behavior: Behavior normal.        Thought Content: Thought content normal.        Judgment: Judgment normal.      BP (!) 164/99   Pulse 63   Temp 97.8 F (36.6 C)   Ht _0  (1.651 m)   Wt 178 lb 9.6 oz (81 kg)   LMP  09/13/2008 (Approximate)   SpO2 95%   BMI 29.72 kg/m      Assessment & Plan:  Veronica Gomez comes in today with chief complaint of Medical Management of Chronic Issues   Diagnosis and orders addressed:  1. Dyslipidemia - CBC with Differential/Platelet - CMP14+EGFR - Lipid panel  2. Essential hypertension -Pt is stressed today, long discussion with patient. She will monitor at home if constantly >140/90 she will let me know and we will change medications.  - CBC with Differential/Platelet - CMP14+EGFR  3. Colon cancer screening - CBC with Differential/Platelet - CMP14+EGFR -  Cologuard  4. Chronic heart disease - CBC with Differential/Platelet - CMP14+EGFR  5. Primary hypertension - CBC with Differential/Platelet - CMP14+EGFR  6. Gastroesophageal reflux disease, unspecified whether esophagitis present - CBC with Differential/Platelet - CMP14+EGFR  7. Obesity (BMI 30.0-34.9) - CBC with Differential/Platelet - CMP14+EGFR   Labs pending Health Maintenance reviewed Diet and exercise encouraged  Follow up plan: 6 months   [name], FNP

## 2021-01-06 LAB — CBC WITH DIFFERENTIAL/PLATELET
Basophils Absolute: 0.1 10*3/uL (ref 0.0–0.2)
Basos: 2 %
EOS (ABSOLUTE): 0.2 10*3/uL (ref 0.0–0.4)
Eos: 5 %
Hematocrit: 41.9 % (ref 34.0–46.6)
Hemoglobin: 13.8 g/dL (ref 11.1–15.9)
Immature Grans (Abs): 0 10*3/uL (ref 0.0–0.1)
Immature Granulocytes: 0 %
Lymphocytes Absolute: 1.4 10*3/uL (ref 0.7–3.1)
Lymphs: 30 %
MCH: 31.9 pg (ref 26.6–33.0)
MCHC: 32.9 g/dL (ref 31.5–35.7)
MCV: 97 fL (ref 79–97)
Monocytes Absolute: 0.3 10*3/uL (ref 0.1–0.9)
Monocytes: 7 %
Neutrophils Absolute: 2.6 10*3/uL (ref 1.4–7.0)
Neutrophils: 56 %
Platelets: 288 10*3/uL (ref 150–450)
RBC: 4.32 x10E6/uL (ref 3.77–5.28)
RDW: 12.2 % (ref 11.7–15.4)
WBC: 4.6 10*3/uL (ref 3.4–10.8)

## 2021-01-06 LAB — CMP14+EGFR
ALT: 21 IU/L (ref 0–32)
AST: 18 IU/L (ref 0–40)
Albumin/Globulin Ratio: 2.1 (ref 1.2–2.2)
Albumin: 4.8 g/dL (ref 3.8–4.8)
Alkaline Phosphatase: 59 IU/L (ref 44–121)
BUN/Creatinine Ratio: 19 (ref 12–28)
BUN: 20 mg/dL (ref 8–27)
Bilirubin Total: 0.6 mg/dL (ref 0.0–1.2)
CO2: 21 mmol/L (ref 20–29)
Calcium: 9.5 mg/dL (ref 8.7–10.3)
Chloride: 103 mmol/L (ref 96–106)
Creatinine, Ser: 1.06 mg/dL — ABNORMAL HIGH (ref 0.57–1.00)
Globulin, Total: 2.3 g/dL (ref 1.5–4.5)
Glucose: 102 mg/dL — ABNORMAL HIGH (ref 65–99)
Potassium: 4.1 mmol/L (ref 3.5–5.2)
Sodium: 140 mmol/L (ref 134–144)
Total Protein: 7.1 g/dL (ref 6.0–8.5)
eGFR: 58 mL/min/{1.73_m2} — ABNORMAL LOW (ref >59)

## 2021-01-06 LAB — LIPID PANEL
Chol/HDL Ratio: 3.2 ratio (ref 0.0–4.4)
Cholesterol, Total: 145 mg/dL (ref 100–199)
HDL: 45 mg/dL (ref >39)
LDL Chol Calc (NIH): 83 mg/dL (ref 0–99)
Triglycerides: 88 mg/dL (ref 0–149)
VLDL Cholesterol Cal: 17 mg/dL (ref 5–40)

## 2021-01-20 DIAGNOSIS — Z1231 Encounter for screening mammogram for malignant neoplasm of breast: Secondary | ICD-10-CM | POA: Diagnosis not present

## 2021-01-28 ENCOUNTER — Other Ambulatory Visit: Payer: Self-pay

## 2021-01-28 ENCOUNTER — Ambulatory Visit: Payer: Medicare Other | Admitting: Internal Medicine

## 2021-01-28 ENCOUNTER — Encounter: Payer: Self-pay | Admitting: Internal Medicine

## 2021-01-28 VITALS — BP 154/90 | HR 67 | Ht 63.5 in | Wt 178.6 lb

## 2021-01-28 DIAGNOSIS — I447 Left bundle-branch block, unspecified: Secondary | ICD-10-CM | POA: Diagnosis not present

## 2021-01-28 DIAGNOSIS — I10 Essential (primary) hypertension: Secondary | ICD-10-CM

## 2021-01-28 DIAGNOSIS — E785 Hyperlipidemia, unspecified: Secondary | ICD-10-CM | POA: Diagnosis not present

## 2021-01-28 DIAGNOSIS — I255 Ischemic cardiomyopathy: Secondary | ICD-10-CM | POA: Diagnosis not present

## 2021-01-28 MED ORDER — SACUBITRIL-VALSARTAN 49-51 MG PO TABS: 1.0000 | ORAL_TABLET | Freq: Two times a day (BID) | ORAL | 11 refills | Status: DC

## 2021-01-28 NOTE — Patient Instructions (Addendum)
Medication Instructions:  START entresto 49/51mg  twice daily tomorrow 01/29/21 -- this will replace losartan  ** free 30 day trial offer provided to patient  *If you need a refill on your cardiac medications before your next appointment, please call your pharmacy*   Lab Work: CMET, BNP, Magnesium labs in 2 weeks  If you have labs (blood work) drawn today and your tests are completely normal, you will receive your results only by: MyChart Message (if you have MyChart) OR A paper copy in the mail If you have any lab test that is abnormal or we need to change your treatment, we will call you to review the results.   Testing/Procedures: Your physician has requested that you have an echocardiogram. Echocardiography is a painless test that uses sound waves to create images of your heart. It provides your doctor with information about the size and shape of your heart and how well your heart's chambers and valves are working. This procedure takes approximately one hour. There are no restrictions for this procedure. -- due December 2022/January 2023 -- [address] - [address] Floor   Follow-Up: At [facility], you and your health needs are our priority.  As part of our continuing mission to provide you with exceptional heart care, we have created designated Provider Care Teams.  These Care Teams include your primary Cardiologist (physician) and Advanced Practice Providers (APPs -  Physician Assistants and Nurse Practitioners) who all work together to provide you with the care you need, when you need it.  We recommend signing up for the patient portal called "MyChart".  Sign up information is provided on this After Visit Summary.  MyChart is used to connect with patients for Virtual Visits (Telemedicine).  Patients are able to view lab/test results, encounter notes, upcoming appointments, etc.  Non-urgent messages can be sent to your provider as well.   To learn more about what you can do  with MyChart, go to [email or web address].    Your next appointment:   3-4 month(s) - after echo  The format for your next appointment:   In Person  Provider:   You may see Chrystie Nose, MD or one of the following Advanced Practice Providers on your designated Care Team:   Azalee Course, PA-C Micah Flesher, PA-C or  Judy Pimple, [place]   Other Instructions

## 2021-01-28 NOTE — Progress Notes (Signed)
OFFICE NOTE  Chief Complaint:  No complaints  Primary Care Physician: Junie Spencer, FNP  HPI:  Veronica Gomez is a pleasant 65 year old female who moved to [place] in January of this year. Her past echo history is significant for hypertension, dyslipidemia and a former smoker, but quit over 25 years ago. 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 In 2009 she was noted to have an interventricular conduction delay, and was referred for stress test which was negative for ischemia. Records indicate 2013 she was having some chest discomfort and had a new, clear left bundle branch block. She underwent a nuclear stress test shows a fixed inferoapical defect. EF was 56%. An echocardiogram performed around the same time showed an EF of 40% with apical inferior wall hypokinesis. This was determined to be scar, no further workup was recommended as her symptoms were not thought to be due to ischemia. No ischemia was noted on her stress test. She is on appropriate medical therapy including aspirin, statin, ACE inhibitor, beta blocker and diuretic. She is quite active around her house and into your and denies any chest pain or worsening shortness of breath. Her weight has been stable. Her last office visit with her cardiologist was in December 2014 no changes to her medical regimen were recommended at that time. She did have a recent lipid profile which showed total cholesterol 162 and LDL of 99.  I saw Sharnika back in the office today. She has no new complaints. We recently did lipid testing which shows her LDL cholesterol to be 99. That is still higher than her goal LDL of less than 70. We talked about possibly adjusting her Lipitor up however she reported she had side effects with that in the past.  [name] returns today for follow-up. Overall she is feeling well. She denies any worsening shortness of breath or chest pain. Blood pressure has been very well  controlled. Recently she had discussed medicine changes which were planning based on her cholesterol not being quite at goal. She was concerned about myalgias on higher dose statin and therefore I recommended adding Avandia. Alternatively her primary care provider suggested she could take a double dose of Lipitor every other day. She's been doing that for at least several months and is due for recheck of her cholesterol. She currently denies any myalgias or myopathy  I had the pleasure seeing [name] back today in the office. Slightly earlier than our normal follow-up appointment. She's changed insurance companies and is interested in starting some more exercise. She denies any chest pain but does get short of breath with activity. She was due for repeat echocardiogram which I like to get to see if she's had any change in her LV function. She is currently on aspirin, Lipitor, carvedilol and lisinopril.  08/25/2016  [name] returns today for follow-up. Overall she is doing well. She denies any worsening shortness of breath or chest pain. She has NYHA class I symptoms. Based on this she does not qualify for Entresto. She is currently on carvedilol and lisinopril although but has been having recent cough. I think this is allergies but her PCP did write to switch her from lisinopril over to losartan. She reports some rhinitis symptoms as well as itchy eyes and no significant pollen burden now. I suggested she may benefit from using a nasal steroid and saline nasal spray. She's currently using cetirizine and diphenhydramine. She should  avoid any decongestants such as pseudoephedrine.  12/01/2019  [name] is seen today in follow-up.  I last saw her via virtual visit during the pandemic about a year ago.  Since then she has done well.  She denies any worsening shortness of breath or chest pain.  She reports her weight has been up or down but is a few pounds lower than it was before.  She is overdue for a  repeat lipid profile.  I previously increased her atorvastatin up to 40 mg to try to reach a target LDL less than 70.  Also her last echocardiogram was in 2017 again showing an ischemic cardiomyopathy with EF of 40%.  She continues to have NYHA class I symptoms.  01/28/2021  [name] returns today for follow-up.  LVEF has remained low at 40 to 45% with some regional wall motion abnormalities however compared to to a prior study in 2017 is stable.  She is on historic heart failure medications but is not on any of the newer guideline directed medical therapies.  We discussed the possibility of switching her ARB to [facility] today and the fact that it might be helpful with improving her LV function although I do believe it is possibly related to bundle branch block.  PMHx:  Past Medical History:  Diagnosis Date   CAD (coronary artery disease)    Cardiomyopathy (HCC)    GERD (gastroesophageal reflux disease)    Hyperlipidemia    Hypertension    Ischemia    Left bundle branch block (LBBB)     Past Surgical History:  Procedure Laterality Date   CERVICAL POLYPECTOMY  2010   FINGER SURGERY  2008   SHOULDER SURGERY Right 2014   TUBAL LIGATION  1988    FAMHx:  Family History  Problem Relation Age of Onset   Osteoporosis Mother    Asthma Mother    Hypertension Mother    Hypertension Father    Heart disease Father        MI    SOCHx:   reports that she quit smoking about 32 years ago. Her smoking use included cigarettes. She has never used smokeless tobacco. She reports current alcohol use. She reports that she does not use drugs.  ALLERGIES:  No Known Allergies  ROS: Pertinent items noted in HPI and remainder of comprehensive ROS otherwise negative.  HOME MEDS: Current Outpatient Medications  Medication Sig Dispense Refill   aspirin 81 MG tablet Take 81 mg by mouth daily.     atorvastatin (LIPITOR) 40 MG tablet Take 1 tablet (40 mg total) by mouth daily. 90 tablet 3    carvedilol (COREG) 6.25 MG tablet Take 1 tablet (6.25 mg total) by mouth 2 (two) times daily with a meal. 180 tablet 3   Cholecalciferol (VITAMIN D3) 25 MCG (1000 UT) CAPS Take by mouth daily.     diclofenac (VOLTAREN) 75 MG EC tablet Take 1 tablet (75 mg total) by mouth 2 (two) times daily. 180 tablet 3   hydrochlorothiazide (HYDRODIURIL) 25 MG tablet Take 1 tablet (25 mg total) by mouth daily. 90 tablet 3   losartan (COZAAR) 100 MG tablet Take 1 tablet (100 mg total) by mouth daily. 90 tablet 3   omeprazole (PRILOSEC) 20 MG capsule Take 1 capsule (20 mg total) by mouth daily. 90 capsule 3   Ascorbic Acid (VITAMIN C PO) Vitamin C Chewable 500 mg tablet   1 tablet every day by oral route.     No current facility-administered medications  for this visit.    LABS/IMAGING: No results found for this or any previous visit (from the past 48 hour(s)). No results found.  VITALS: BP (!) 154/90   Pulse 67   Ht 5' 3.5" (1.613 m)   Wt 178 lb 9.6 oz (81 kg)   LMP 09/13/2008 (Approximate)   SpO2 98%   BMI 31.14 kg/m   EXAM: General appearance: alert and no distress Neck: no carotid bruit and no JVD Lungs: clear to auscultation bilaterally Heart: regular rate and rhythm, S1, S2 normal, no murmur, click, rub or gallop Abdomen: soft, non-tender; bowel sounds normal; no masses,  no organomegaly Extremities: extremities normal, atraumatic, no cyanosis or edema Pulses: 2+ and symmetric Skin: Skin color, texture, turgor normal. No rashes or lesions Neurologic: Grossly normal Psych: Mood, affect normal  EKG: Normal sinus rhythm at 67, left bundle branch block-personally reviewed  ASSESSMENT: Ischemic cardiomyopathy, EF between 40 and 50% Fixed inferoapical defect suggestive of scar on prior nuclear stress testing Chronic LBBB Hypertension Dyslipidemia Former smoker  PLAN: 1.   Mrs. Sladek continues to have a suppressed LVEF.  She has class I to class II NYHA symptoms.  She has chronic left  bundle branch block with a wide QRS however EF is too high to consider a synchronization therapy. Would recommend changing her ARB over to Entresto 49/51 mg, BID. She did seem agreeable to that.  We will plan some labs in a couple weeks and repeat echo in about 3 months.  Follow-up with me in 3 months or sooner as necessary.  Chrystie Nose, MD, [facility], FACP  [place]  [facility]  Medical Director of the Advanced Lipid Disorders &  Cardiovascular Risk Reduction Clinic Diplomate of the American Board of Clinical Lipidology Attending Cardiologist  Direct Dial: [phone]  Fax: [phone]  Website:  [email or web address] 01/28/2021, 9:31 AM

## 2021-02-14 DIAGNOSIS — E785 Hyperlipidemia, unspecified: Secondary | ICD-10-CM | POA: Diagnosis not present

## 2021-02-14 DIAGNOSIS — I447 Left bundle-branch block, unspecified: Secondary | ICD-10-CM | POA: Diagnosis not present

## 2021-02-14 DIAGNOSIS — I10 Essential (primary) hypertension: Secondary | ICD-10-CM | POA: Diagnosis not present

## 2021-02-14 DIAGNOSIS — I255 Ischemic cardiomyopathy: Secondary | ICD-10-CM | POA: Diagnosis not present

## 2021-02-15 LAB — COMPREHENSIVE METABOLIC PANEL
ALT: 21 IU/L (ref 0–32)
AST: 17 IU/L (ref 0–40)
Albumin/Globulin Ratio: 2 (ref 1.2–2.2)
Albumin: 4.7 g/dL (ref 3.8–4.8)
Alkaline Phosphatase: 62 IU/L (ref 44–121)
BUN/Creatinine Ratio: 22 (ref 12–28)
BUN: 21 mg/dL (ref 8–27)
Bilirubin Total: 0.4 mg/dL (ref 0.0–1.2)
CO2: 25 mmol/L (ref 20–29)
Calcium: 9.6 mg/dL (ref 8.7–10.3)
Chloride: 102 mmol/L (ref 96–106)
Creatinine, Ser: 0.96 mg/dL (ref 0.57–1.00)
Globulin, Total: 2.4 g/dL (ref 1.5–4.5)
Glucose: 73 mg/dL (ref 70–99)
Potassium: 4.1 mmol/L (ref 3.5–5.2)
Sodium: 141 mmol/L (ref 134–144)
Total Protein: 7.1 g/dL (ref 6.0–8.5)
eGFR: 65 mL/min/{1.73_m2} (ref >59)

## 2021-02-15 LAB — BRAIN NATRIURETIC PEPTIDE: BNP: 30.1 pg/mL (ref 0.0–100.0)

## 2021-02-15 LAB — MAGNESIUM: Magnesium: 2.2 mg/dL (ref 1.6–2.3)

## 2021-03-02 ENCOUNTER — Encounter: Payer: Self-pay | Admitting: Internal Medicine

## 2021-04-25 ENCOUNTER — Encounter: Payer: Self-pay | Admitting: Family

## 2021-04-25 ENCOUNTER — Ambulatory Visit (INDEPENDENT_AMBULATORY_CARE_PROVIDER_SITE_OTHER): Payer: Medicare Other | Admitting: Family

## 2021-04-25 VITALS — BP 136/79 | HR 83 | Temp 97.8°F | Ht 63.5 in | Wt 181.0 lb

## 2021-04-25 DIAGNOSIS — M545 Low back pain, unspecified: Secondary | ICD-10-CM | POA: Diagnosis not present

## 2021-04-25 LAB — MICROSCOPIC EXAMINATION
Bacteria, UA: NONE SEEN
Cast Type: NONE SEEN
Casts: NONE SEEN /lpf
Crystal Type: NONE SEEN
Crystals: NONE SEEN
Epithelial Cells (non renal): NONE SEEN /hpf (ref 0–10)
Mucus, UA: NONE SEEN
RBC, Urine: NONE SEEN /hpf (ref 0–2)
Renal Epithel, UA: NONE SEEN /hpf
WBC, UA: NONE SEEN /hpf (ref 0–5)
Yeast, UA: NONE SEEN

## 2021-04-25 LAB — URINALYSIS, COMPLETE
Bilirubin, UA: NEGATIVE
Glucose, UA: NEGATIVE
Ketones, UA: NEGATIVE
Leukocytes,UA: NEGATIVE
Nitrite, UA: NEGATIVE
Protein,UA: NEGATIVE
RBC, UA: NEGATIVE
Specific Gravity, UA: 1.01 (ref 1.005–1.030)
Urobilinogen, Ur: 0.2 mg/dL (ref 0.2–1.0)
pH, UA: 5.5 (ref 5.0–7.5)

## 2021-04-25 MED ORDER — PREDNISONE 10 MG (21) PO TBPK
ORAL_TABLET | ORAL | 0 refills | Status: DC
Start: 2021-04-25 — End: 2021-05-17

## 2021-04-25 MED ORDER — BACLOFEN 10 MG PO TABS: 10.0000 mg | ORAL_TABLET | Freq: Three times a day (TID) | ORAL | 0 refills | Status: DC

## 2021-04-25 NOTE — Progress Notes (Signed)
Subjective:    Patient ID: Veronica Gomez, female    DOB: 07-18-1955, 65 y.o.   MRN: [id number]  Chief Complaint  Patient presents with   Back Pain    X a few months     Back Pain This is a new problem. The current episode started more than 1 month ago. The problem occurs intermittently. The problem has been waxing and waning since onset. The pain is present in the lumbar spine. The quality of the pain is described as aching. The pain does not radiate. The pain is at a severity of 8/10. The pain is moderate. The symptoms are aggravated by bending and twisting. Pertinent negatives include no dysuria, headaches, leg pain, paresis or pelvic pain. Risk factors include obesity. She has tried bed rest for the symptoms. The treatment provided mild relief.     Review of Systems  Genitourinary:  Negative for dysuria and pelvic pain.  Musculoskeletal:  Positive for back pain.  Neurological:  Negative for headaches.  All other systems reviewed and are negative.     Objective:   Physical Exam Vitals reviewed.  Constitutional:      General: She is not in acute distress.    Appearance: She is well-developed.  HENT:     Head: Normocephalic and atraumatic.     Right Ear: Tympanic membrane normal.     Left Ear: Tympanic membrane normal.  Eyes:     Pupils: Pupils are equal, round, and reactive to light.  Neck:     Thyroid: No thyromegaly.  Cardiovascular:     Rate and Rhythm: Normal rate and regular rhythm.     Heart sounds: Normal heart sounds. No murmur heard. Pulmonary:     Effort: Pulmonary effort is normal. No respiratory distress.     Breath sounds: Normal breath sounds. No wheezing.  Abdominal:     General: Bowel sounds are normal. There is no distension.     Palpations: Abdomen is soft.     Tenderness: There is no abdominal tenderness.  Musculoskeletal:        General: No tenderness.       Arms:     Cervical back: Normal range of motion and neck supple.     Comments: Pain in  lumbar with flexion and extension  Skin:    General: Skin is warm and dry.  Neurological:     Mental Status: She is alert and oriented to person, place, and time.     Cranial Nerves: No cranial nerve deficit.     Deep Tendon Reflexes: Reflexes are normal and symmetric.  Psychiatric:        Behavior: Behavior normal.        Thought Content: Thought content normal.        Judgment: Judgment normal.     BP 136/79   Pulse 83   Temp 97.8 F (36.6 C) (Temporal)   Ht 5' 3.5" (1.613 m)   Wt 181 lb (82.1 kg)   LMP 09/13/2008 (Approximate)   BMI 31.56 kg/m       Assessment & Plan:  Veronica Gomez comes in today with chief complaint of Back Pain (X a few months )   Diagnosis and orders addressed:  1. Low back pain without sciatica, unspecified back pain laterality, unspecified chronicity Rest ROM exercises encouraged Continue diclofenac BID with food Sedation precautions discussed  RTO if symptoms worsen or do not improve  - Urinalysis, Complete - Urine Culture - predniSONE (STERAPRED UNI-PAK 21 TAB) 10 MG (  21) TBPK tablet; Use as directed  Dispense: 21 tablet; Refill: 0 - baclofen (LIORESAL) 10 MG tablet; Take 1 tablet (10 mg total) by mouth 3 (three) times daily.  Dispense: 30 each; Refill: 0    Jannifer Rodney, FNP

## 2021-04-25 NOTE — Patient Instructions (Signed)

## 2021-04-28 LAB — URINE CULTURE

## 2021-05-03 ENCOUNTER — Other Ambulatory Visit: Payer: Self-pay

## 2021-05-03 ENCOUNTER — Ambulatory Visit (HOSPITAL_COMMUNITY): Payer: Medicare Other | Attending: Internal Medicine

## 2021-05-03 DIAGNOSIS — I255 Ischemic cardiomyopathy: Secondary | ICD-10-CM | POA: Insufficient documentation

## 2021-05-03 LAB — ECHOCARDIOGRAM COMPLETE
Area-P 1/2: 3.95 cm2
S' Lateral: 2.8 cm

## 2021-05-12 ENCOUNTER — Other Ambulatory Visit: Payer: Self-pay | Admitting: Family

## 2021-05-12 DIAGNOSIS — K219 Gastro-esophageal reflux disease without esophagitis: Secondary | ICD-10-CM

## 2021-05-16 NOTE — Progress Notes (Signed)
Office Visit    Patient Name: Veronica SpeakBeverly Benassi Date of Encounter: 05/17/2021  PCP:  Junie SpencerHawks, Christy A, FNP   [facility]  Cardiologist:  Chrystie NoseKenneth C Hilty, MD  Advanced Practice Provider:  No care team member to display Electrophysiologist:  None   Chief Complaint    Veronica Gomez is a 66 y.o. female with a hx of hypertension, dyslipidemia, CAD, GERD, left bundle branch block and former smoker who quit over 25 years ago.  She Presents today for a 3 to 5167-month follow-up appointment.  Past Medical History    Past Medical History:  Diagnosis Date   CAD (coronary artery disease)    Cardiomyopathy (HCC)    GERD (gastroesophageal reflux disease)    Hyperlipidemia    Hypertension    Ischemia    Left bundle branch block (LBBB)    Past Surgical History:  Procedure Laterality Date   CERVICAL POLYPECTOMY  2010   FINGER SURGERY  2008   SHOULDER SURGERY Right 2014   TUBAL LIGATION  1988    Allergies  No Known Allergies  History of Present Illness    Veronica SpeakBeverly Spittler is a 66 y.o. female with a hx of hypertension, dyslipidemia, CAD, GERD, left bundle branch block and former smoker who quit over 25 years ago.  She Presents today for a 3 to 467-month follow-up appointment. She was last seen 01/28/2021 by Dr. Rennis GoldenHilty.  Her cardiac history began back in 2009 when she was noted to have an intraventricular conduction delay and was referred to stress test which was negative for ischemia.  Records indicate 2013 she was having some chest discomfort and had a new left bundle branch block.  She underwent a nuclear stress test which showed a fixed inferior apical defect.  EF at this time was 56%.  An echocardiogram performed around the same time showed an EF of 40% with apical inferior wall hypokinesis.  This was determined to be scar, and no further work-up was recommended as her symptoms were not thought to be due to ischemia.  No ischemia was noted on her stress test.  She was on  appropriate medical therapy including aspirin, statin, ACE inhibitor, beta-blocker and diuretic.  She was quite active around her house and she denied any chest pain or worsening shortness of breath.  Her weight has been stable.  In the past when she was seen, she had a lipid panel which showed LDL 99 which is still higher than her goal of less than 70.  She was not willing to increase her Lipitor.  Blood pressure has been well controlled.  When revisiting her cholesterol levels she agreed to doubling her dose of Lipitor every other day.  She denied any myalgias or myopathy with this regimen.  When she was seen back in September 2022 her LVEF had remained low at about 40 to 45% with some regional wall motion abnormalities however, compared to the prior study in 2017 this was stable.  She is on historic heart failure medications but is not on any newer guideline directed medical therapies.  Switching her to Sherryll Burgerntresto was discussed.  Today, she feels pretty good since starting Entresto.  She denies any lower extremity edema or shortness of breath.  She does try to stay active however her chronic back pain limits her at times.  Her recent Echo was performed back in 05/03/2021 and showed a stable EF at 40-45%. Her BP has been well controlled and she has not had any chest  pain. Her renal function is well controlled on her last set of labs but we have ordered a CMP and Lipid panel today to check her liver and renal function. She is open to starting [place] today to better optimize her HF regimen. We plan to stop her HCTZ and she is to keep track of her BP. She is encouraged to weigh daily.   Reports no shortness of breath nor dyspnea on exertion. Reports no chest pain, pressure, or tightness. No edema, orthopnea, PND. Reports no palpitations.    EKGs/Labs/Other Studies Reviewed:   The following studies were reviewed today:  Echocardiogram 05/03/2021  IMPRESSIONS     1. Left ventricular ejection fraction,  by estimation, is 40 to 45%. The  left ventricle has mildly decreased function. The left ventricle has no  regional wall motion abnormalities. There is mild left ventricular  hypertrophy. Left ventricular diastolic  parameters are consistent with Grade I diastolic dysfunction (impaired  relaxation). LBBB.   2. Right ventricular systolic function is normal. The right ventricular  size is normal. There is normal pulmonary artery systolic pressure.   3. The mitral valve is normal in structure. No evidence of mitral valve  regurgitation. No evidence of mitral stenosis.   4. The aortic valve is calcified. There is mild calcification of the  aortic valve. There is mild thickening of the aortic valve. Aortic valve  regurgitation is not visualized. Aortic valve sclerosis/calcification is  present, without any evidence of  aortic stenosis.   5. The inferior vena cava is normal in size with greater than 50%  respiratory variability, suggesting right atrial pressure of 3 mmHg.   Comparison(s): No significant change from prior study. Prior images  reviewed side by side.   FINDINGS   Left Ventricle: Left ventricular ejection fraction, by estimation, is 40  to 45%. The left ventricle has mildly decreased function. The left  ventricle has no regional wall motion abnormalities. 3D left ventricular  ejection fraction analysis performed but   not reported based on interpreter judgement due to suboptimal tracking.  The left ventricular internal cavity size was normal in size. There is  mild left ventricular hypertrophy. Abnormal (paradoxical) septal motion,  consistent with left bundle branch  block. Left ventricular diastolic parameters are consistent with Grade I  diastolic dysfunction (impaired relaxation).   EKG:  EKG is not ordered today.   Recent Labs: 01/05/2021: Hemoglobin 13.8; Platelets 288 02/14/2021: ALT 21; BNP 30.1; BUN 21; Creatinine, Ser 0.96; Magnesium 2.2; Potassium 4.1; Sodium 141   Recent Lipid Panel    Component Value Date/Time   CHOL 145 01/05/2021 0916   TRIG 88 01/05/2021 0916   HDL 45 01/05/2021 0916   CHOLHDL 3.2 01/05/2021 0916   LDLCALC 83 01/05/2021 0916    Home Medications   Current Meds  Medication Sig   Ascorbic Acid (VITAMIN C PO) Vitamin C Chewable 500 mg tablet   1 tablet every day by oral route.   aspirin 81 MG tablet Take 81 mg by mouth daily.   atorvastatin (LIPITOR) 40 MG tablet Take 1 tablet (40 mg total) by mouth daily.   baclofen (LIORESAL) 10 MG tablet Take 1 tablet (10 mg total) by mouth 3 (three) times daily.   carvedilol (COREG) 6.25 MG tablet Take 1 tablet (6.25 mg total) by mouth 2 (two) times daily with a meal.   Cholecalciferol (VITAMIN D3) 25 MCG (1000 UT) CAPS Take by mouth daily.   dapagliflozin propanediol (FARXIGA) 10 MG TABS tablet Take 1 tablet (  10 mg total) by mouth daily before breakfast.   diclofenac (VOLTAREN) 75 MG EC tablet Take 1 tablet (75 mg total) by mouth 2 (two) times daily.   omeprazole (PRILOSEC) 20 MG capsule TAKE 1 CAPSULE BY MOUTH  DAILY   sacubitril-valsartan (ENTRESTO) 49-51 MG Take 1 tablet by mouth 2 (two) times daily.   [DISCONTINUED] hydrochlorothiazide (HYDRODIURIL) 25 MG tablet Take 1 tablet (25 mg total) by mouth daily.     Review of Systems      All other systems reviewed and are otherwise negative except as noted above.  Physical Exam    VS:  BP 130/88    Pulse 78    Ht 5\' 4"  (1.626 m)    Wt 180 lb 3.2 oz (81.7 kg)    LMP 09/13/2008 (Approximate)    SpO2 100%    BMI 30.93 kg/m  , BMI Body mass index is 30.93 kg/m.  Wt Readings from Last 3 Encounters:  05/17/21 180 lb 3.2 oz (81.7 kg)  04/25/21 181 lb (82.1 kg)  01/28/21 178 lb 9.6 oz (81 kg)     GEN: Well nourished, well developed, in no acute distress. HEENT: normal. Neck: Supple, no JVD, carotid bruits, or masses. Cardiac: RRR, no murmurs, rubs, or gallops. No clubbing, cyanosis, edema.  Radials/PT 2+ and equal bilaterally.   Respiratory:  Respirations regular and unlabored, clear to auscultation bilaterally. GI: Soft, nontender, nondistended. MS: No deformity or atrophy. Skin: Warm and dry, no rash. Neuro:  Strength and sensation are intact. Psych: Normal affect.  Assessment & Plan    Ischemic cardiomyopathy, EF between 40 to 50% -Class I to class II NYHA symptoms.  Switched to [place] a few months ago. -Echocardiogram performed 05/06/2021 which showed stable LVEF at 40 to 45%.  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  Signed, Chrystie Nose, PA-C 05/17/2021, 11:03 AM [facility]

## 2021-05-17 ENCOUNTER — Other Ambulatory Visit: Payer: Self-pay

## 2021-05-17 ENCOUNTER — Ambulatory Visit: Payer: Medicare Other | Admitting: Physician Assistant

## 2021-05-17 ENCOUNTER — Encounter: Payer: Self-pay | Admitting: Physician Assistant

## 2021-05-17 VITALS — BP 130/88 | HR 78 | Ht 64.0 in | Wt 180.2 lb

## 2021-05-17 DIAGNOSIS — I10 Essential (primary) hypertension: Secondary | ICD-10-CM | POA: Diagnosis not present

## 2021-05-17 DIAGNOSIS — I447 Left bundle-branch block, unspecified: Secondary | ICD-10-CM

## 2021-05-17 DIAGNOSIS — I255 Ischemic cardiomyopathy: Secondary | ICD-10-CM | POA: Diagnosis not present

## 2021-05-17 DIAGNOSIS — E785 Hyperlipidemia, unspecified: Secondary | ICD-10-CM | POA: Diagnosis not present

## 2021-05-17 DIAGNOSIS — Z87891 Personal history of nicotine dependence: Secondary | ICD-10-CM

## 2021-05-17 MED ORDER — SACUBITRIL-VALSARTAN 49-51 MG PO TABS: 1.0000 | ORAL_TABLET | Freq: Two times a day (BID) | ORAL | 1 refills | Status: DC

## 2021-05-17 MED ORDER — DAPAGLIFLOZIN PROPANEDIOL 10 MG PO TABS: 10.0000 mg | ORAL_TABLET | Freq: Every day | ORAL | 3 refills | Status: DC

## 2021-05-17 NOTE — Patient Instructions (Addendum)
Medication Instructions:  Start Farxiga 10 mg ( Take 1 Tablet Daily). Stop HCTZ. *If you need a refill on your cardiac medications before your next appointment, please call your pharmacy*   Lab Work: Lipid Panel, CMET If you have labs (blood work) drawn today and your tests are completely normal, you will receive your results only by: MyChart Message (if you have MyChart) OR A paper copy in the mail If you have any lab test that is abnormal or we need to change your treatment, we will call you to review the results.   Testing/Procedures: No Testing   Follow-Up: At [facility], you and your health needs are our priority.  As part of our continuing mission to provide you with exceptional heart care, we have created designated Provider Care Teams.  These Care Teams include your primary Cardiologist (physician) and Advanced Practice Providers (APPs -  Physician Assistants and Nurse Practitioners) who all work together to provide you with the care you need, when you need it.  We recommend signing up for the patient portal called "MyChart".  Sign up information is provided on this After Visit Summary.  MyChart is used to connect with patients for Virtual Visits (Telemedicine).  Patients are able to view lab/test results, encounter notes, upcoming appointments, etc.  Non-urgent messages can be sent to your provider as well.   To learn more about what you can do with MyChart, go to [email or web address].    Your next appointment:   6 month(s)  The format for your next appointment:   In Person  Provider:   Chrystie Nose, MD     Other Instructions Monitor Blood Pressure Daily. If Blood Pressure over 140/80 on a consistent basis call our office

## 2021-05-24 ENCOUNTER — Other Ambulatory Visit: Payer: Medicare Other

## 2021-05-24 DIAGNOSIS — E785 Hyperlipidemia, unspecified: Secondary | ICD-10-CM | POA: Diagnosis not present

## 2021-05-24 DIAGNOSIS — I447 Left bundle-branch block, unspecified: Secondary | ICD-10-CM | POA: Diagnosis not present

## 2021-05-24 DIAGNOSIS — Z87891 Personal history of nicotine dependence: Secondary | ICD-10-CM | POA: Diagnosis not present

## 2021-05-24 DIAGNOSIS — I10 Essential (primary) hypertension: Secondary | ICD-10-CM | POA: Diagnosis not present

## 2021-05-24 DIAGNOSIS — I255 Ischemic cardiomyopathy: Secondary | ICD-10-CM | POA: Diagnosis not present

## 2021-05-25 ENCOUNTER — Telehealth: Payer: Self-pay | Admitting: *Deleted

## 2021-05-25 ENCOUNTER — Telehealth: Payer: Self-pay | Admitting: Physician Assistant

## 2021-05-25 DIAGNOSIS — E785 Hyperlipidemia, unspecified: Secondary | ICD-10-CM

## 2021-05-25 LAB — LIPID PANEL
Chol/HDL Ratio: 3.6 ratio (ref 0.0–4.4)
Cholesterol, Total: 160 mg/dL (ref 100–199)
HDL: 45 mg/dL (ref >39)
LDL Chol Calc (NIH): 100 mg/dL — ABNORMAL HIGH (ref 0–99)
Triglycerides: 78 mg/dL (ref 0–149)
VLDL Cholesterol Cal: 15 mg/dL (ref 5–40)

## 2021-05-25 LAB — COMPREHENSIVE METABOLIC PANEL
ALT: 17 IU/L (ref 0–32)
AST: 14 IU/L (ref 0–40)
Albumin/Globulin Ratio: 2 (ref 1.2–2.2)
Albumin: 4.3 g/dL (ref 3.8–4.8)
Alkaline Phosphatase: 57 IU/L (ref 44–121)
BUN/Creatinine Ratio: 20 (ref 12–28)
BUN: 20 mg/dL (ref 8–27)
Bilirubin Total: 0.5 mg/dL (ref 0.0–1.2)
CO2: 23 mmol/L (ref 20–29)
Calcium: 9.2 mg/dL (ref 8.7–10.3)
Chloride: 107 mmol/L — ABNORMAL HIGH (ref 96–106)
Creatinine, Ser: 1 mg/dL (ref 0.57–1.00)
Globulin, Total: 2.2 g/dL (ref 1.5–4.5)
Glucose: 90 mg/dL (ref 70–99)
Potassium: 4.3 mmol/L (ref 3.5–5.2)
Sodium: 143 mmol/L (ref 134–144)
Total Protein: 6.5 g/dL (ref 6.0–8.5)
eGFR: 62 mL/min/{1.73_m2} (ref >59)

## 2021-05-25 NOTE — Telephone Encounter (Signed)
Left message on patient's cell phone- detailed lab result given . Per dpr,Any question may call back   Mailed labslip please do about week -prior to next appointment in June 2023.  Mailed labslip

## 2021-05-25 NOTE — Telephone Encounter (Signed)
-----   Message from Sharlene Dory, [place] sent at 05/25/2021  8:40 AM EST ----- Please share with the patient:   Ms. Thursby,   Your LDL (bad cholesterol) is higher than its been but still at goal. I would recommend diet modifications at this point before adding another cholesterol medication. Consider lipid lower foods like oatmeal, high-fiber foods, beans, nuts, reduce saturated fats and eliminate trans fats. We will plan to re-draw your lipid panel in 6 months to make sure things are trending in the right direction.   Take Care,   Jari Favre, PA-C

## 2021-05-25 NOTE — Telephone Encounter (Signed)
Pt c/o medication issue:  1. Name of Medication: dapagliflozin propanediol (FARXIGA) 10 MG TABS tablet  2. How are you currently taking this medication (dosage and times per day)? Take 1 tablet (10 mg total) by mouth daily before breakfast.  3. Are you having a reaction (difficulty breathing--STAT)? no  4. What is your medication issue? She thinks it's raising her BP.  She got a reading this morning of 155/102 this morning was the highest, her left arm was hurting this morning. Most readings this week was in the 140's/90's.

## 2021-05-25 NOTE — Telephone Encounter (Signed)
When asked about her blood pressure readings she states "I do not write it down I just sit here with the cuff on."  Left arm throbbing this morning when woke up this morning. Past few days tried to remember to take the pill then take the blood pressure an hour later. Denies arm pain now, "off and on for a couple of hours." "I do not want to take the [place] anymore. I was not having any issues before I started taking this medication. I feel runned down and I don't like how I am feeling. Mayne I should go back on the water pill, I don;t know"  Last readings she remembers: 155/102, 168/102, 148/95, 146/95.

## 2021-05-26 NOTE — Telephone Encounter (Signed)
Her next appt wit Dr. Rennis Golden is 6/26. I set her up with an appt with pharmacy on 2/1.

## 2021-05-26 NOTE — Telephone Encounter (Signed)
Called pt and relayed Tessa's message. Pt verbalized understanding. Pt was set up with an appointment to discuss Marcelline Deist  with the pharmacy (Feb 1st). She thanked me for calling her.

## 2021-06-15 ENCOUNTER — Other Ambulatory Visit: Payer: Self-pay

## 2021-06-15 ENCOUNTER — Ambulatory Visit: Payer: Medicare Other | Admitting: Pharmacist

## 2021-06-15 VITALS — BP 158/98 | HR 72 | Resp 14 | Ht 64.0 in | Wt 182.4 lb

## 2021-06-15 DIAGNOSIS — I519 Heart disease, unspecified: Secondary | ICD-10-CM | POA: Diagnosis not present

## 2021-06-15 DIAGNOSIS — I255 Ischemic cardiomyopathy: Secondary | ICD-10-CM

## 2021-06-15 MED ORDER — EMPAGLIFLOZIN 10 MG PO TABS: 10.0000 mg | ORAL_TABLET | Freq: Every day | ORAL | 0 refills | Status: DC

## 2021-06-15 NOTE — Progress Notes (Signed)
Patient ID: Veronica Gomez                 DOB: 02-Jun-1955                      MRN: [id number]     HPI: Veronica Gomez is a 66 y.o. female referred by Veronica Gomez to PharmD clinic. PMH is significant for cardiomyopathy, LBBB, HTN, and HLD.  Most recent EF 40-45% on 05/03/21.  At last visit patient was started on Farxiga but she discontinued due to adverse effects.  Patient presents today in good spirits.  Veronica Gomez for 1 week and developed elevated blood pressures and left arm began "throbbing" and pulsing.  Scared her and she discontinued [place] the next morning.    Patient did not bring in blood pressure log this morning.    Current HTN meds:  Entresto 49-51mg  BID Carvedilol 6.25mg  BID HCTZ 25mg  daily  Previously tried: 10mg  daily BP goal: <130/80  Family History: Heart Disease in mother and father  Social History: like 1 beer in the Summer  Home BP readings:   Wt Readings from Last 3 Encounters:  06/15/21 182 lb 6.4 oz (82.7 kg)  05/17/21 180 lb 3.2 oz (81.7 kg)  04/25/21 181 lb (82.1 kg)   BP Readings from Last 3 Encounters:  06/15/21 (!) 158/98  05/17/21 130/88  04/25/21 136/79   Pulse Readings from Last 3 Encounters:  06/15/21 72  05/17/21 78  04/25/21 83    Renal function: CrCl cannot be calculated (Patient's most recent lab result is older than the maximum 21 days allowed.).  Past Medical History:  Diagnosis Date   CAD (coronary artery disease)    Cardiomyopathy (HCC)    GERD (gastroesophageal reflux disease)    Hyperlipidemia    Hypertension    Ischemia    Left bundle branch block (LBBB)     Current Outpatient Medications on File Prior to Visit  Medication Sig Dispense Refill   Ascorbic Acid (VITAMIN C PO) Vitamin C Chewable 500 mg tablet   1 tablet every day by oral route.     aspirin 81 MG tablet Take 81 mg by mouth daily.     atorvastatin (LIPITOR) 40 MG tablet Take 1 tablet (40 mg total) by mouth daily. 90 tablet 3   baclofen  (LIORESAL) 10 MG tablet Take 1 tablet (10 mg total) by mouth 3 (three) times daily. 30 each 0   carvedilol (COREG) 6.25 MG tablet Take 1 tablet (6.25 mg total) by mouth 2 (two) times daily with a meal. 180 tablet 3   Cholecalciferol (VITAMIN D3) 25 MCG (1000 UT) CAPS Take by mouth daily.     diclofenac (VOLTAREN) 75 MG EC tablet Take 1 tablet (75 mg total) by mouth 2 (two) times daily. 180 tablet 3   hydrochlorothiazide (HYDRODIURIL) 25 MG tablet Take 25 mg by mouth daily.     omeprazole (PRILOSEC) 20 MG capsule TAKE 1 CAPSULE BY MOUTH  DAILY 90 capsule 0   sacubitril-valsartan (ENTRESTO) 49-51 MG Take 1 tablet by mouth 2 (two) times daily. 180 tablet 1   dapagliflozin propanediol (FARXIGA) 10 MG TABS tablet Take 1 tablet (10 mg total) by mouth daily before breakfast. (Patient not taking: Reported on 06/15/2021) 30 tablet 3   No current facility-administered medications on file prior to visit.    Allergies  Allergen Reactions   Farxiga [Dapagliflozin]     myalgias     Assessment/Plan:  1. Hypertension -  Patient BP in room 158/98 which is above goal of <130/80. However patient reports it is usually elevated in clinic.  Discussed common Farxiga side effects with patient. Found data that showed upper limb pain occurred in 2% of patients, however it was not specific.  Since this is an uncommon an odd side effect, I think it would be appropriate to try Jardiance for 1-2 weeks to see if she has the same adverse effects.  Gave patient two 7 day sample bottles.  Instructed patient to call back if she had same effects.  If she does not, can send in Rx.  Continue Entresto 49-51mg  BID Continue carvedilol 6.25mg  BID Start Jardiance 10mg  once daily Follow up in 1 week  , PharmD, BCACP, CDCES, CPP [address], [address] [place], [place], [place] Phone: [phone], Fax: [phone]

## 2021-06-15 NOTE — Patient Instructions (Addendum)
It was nice meeting you today  We would like your blood pressure to be less than 130/80  Continue your Entresto 49-51mg  twice a day Continue your carvedilol 6.25mg  twice a day  We will try a trial of Jardiance 10mg  once a day in the morning.  If you have the same adverse effects please call and discontinue  Please call if you have any adverse effects  [place], PharmD, BCACP, CDCES, CPP [address], [address] [place], [place], [place] Phone: [phone], Fax: [phone]

## 2021-06-20 ENCOUNTER — Encounter: Payer: Self-pay | Admitting: Family

## 2021-06-20 ENCOUNTER — Ambulatory Visit (INDEPENDENT_AMBULATORY_CARE_PROVIDER_SITE_OTHER): Payer: Medicare Other | Admitting: Family

## 2021-06-20 VITALS — BP 123/80 | HR 69 | Temp 97.5°F | Ht 64.0 in | Wt 182.4 lb

## 2021-06-20 DIAGNOSIS — K219 Gastro-esophageal reflux disease without esophagitis: Secondary | ICD-10-CM

## 2021-06-20 DIAGNOSIS — I10 Essential (primary) hypertension: Secondary | ICD-10-CM

## 2021-06-20 DIAGNOSIS — E785 Hyperlipidemia, unspecified: Secondary | ICD-10-CM | POA: Diagnosis not present

## 2021-06-20 DIAGNOSIS — M545 Low back pain, unspecified: Secondary | ICD-10-CM

## 2021-06-20 DIAGNOSIS — I519 Heart disease, unspecified: Secondary | ICD-10-CM

## 2021-06-20 DIAGNOSIS — E669 Obesity, unspecified: Secondary | ICD-10-CM

## 2021-06-20 DIAGNOSIS — E66811 Obesity, class 1: Secondary | ICD-10-CM

## 2021-06-20 MED ORDER — BACLOFEN 10 MG PO TABS: 10.0000 mg | ORAL_TABLET | Freq: Three times a day (TID) | ORAL | 0 refills | Status: DC

## 2021-06-20 NOTE — Patient Instructions (Signed)

## 2021-06-20 NOTE — Progress Notes (Signed)
Subjective:    Patient ID: Veronica Gomez, female    DOB: April 27, 1956, 66 y.o.   MRN: [id number]  Chief Complaint  Patient presents with   Medical Management of Chronic Issues   Back Pain   Pt presents to the office today for chronic follow up. She is followed by Cardiologists annually for HTN and cardiomyopathy. She is very stressed today because her husband is an alcoholic.  Back Pain This is a chronic problem. The current episode started more than 1 year ago. The problem occurs intermittently. The problem has been waxing and waning since onset. The pain is present in the lumbar spine. The quality of the pain is described as aching. The pain is at a severity of 5/10. The pain is moderate. The symptoms are aggravated by bending. Pertinent negatives include no numbness, pelvic pain or weakness. She has tried bed rest and muscle relaxant for the symptoms. The treatment provided mild relief.  Hypertension This is a chronic problem. The current episode started more than 1 year ago. The problem has been resolved since onset. The problem is controlled. Pertinent negatives include no malaise/fatigue, peripheral edema or shortness of breath. Risk factors for coronary artery disease include dyslipidemia, obesity and sedentary lifestyle. The current treatment provides moderate improvement.  Gastroesophageal Reflux She complains of belching and heartburn. This is a chronic problem. The current episode started more than 1 year ago. The problem occurs occasionally. The problem has been waxing and waning. Risk factors include obesity. She has tried a PPI for the symptoms. The treatment provided moderate relief.  Hyperlipidemia This is a chronic problem. The current episode started more than 1 year ago. The problem is controlled. Exacerbating diseases include obesity. Pertinent negatives include no shortness of breath. Current antihyperlipidemic treatment includes statins. The current treatment provides moderate  improvement of lipids. Risk factors for coronary artery disease include dyslipidemia, hypertension, a sedentary lifestyle and post-menopausal.     Review of Systems  Constitutional:  Negative for malaise/fatigue.  Respiratory:  Negative for shortness of breath.   Gastrointestinal:  Positive for heartburn.  Genitourinary:  Negative for pelvic pain.  Musculoskeletal:  Positive for back pain.  Neurological:  Negative for weakness and numbness.  All other systems reviewed and are negative.     Objective:   Physical Exam Vitals reviewed.  Constitutional:      General: She is not in acute distress.    Appearance: She is well-developed. She is obese.  HENT:     Head: Normocephalic and atraumatic.     Right Ear: Tympanic membrane normal.     Left Ear: Tympanic membrane normal.  Eyes:     Pupils: Pupils are equal, round, and reactive to light.  Neck:     Thyroid: No thyromegaly.  Cardiovascular:     Rate and Rhythm: Normal rate and regular rhythm.     Heart sounds: Normal heart sounds. No murmur heard. Pulmonary:     Effort: Pulmonary effort is normal. No respiratory distress.     Breath sounds: Normal breath sounds. No wheezing.  Abdominal:     General: Bowel sounds are normal. There is no distension.     Palpations: Abdomen is soft.     Tenderness: There is no abdominal tenderness.  Musculoskeletal:        General: No tenderness. Normal range of motion.     Cervical back: Normal range of motion and neck supple.     Comments: Pain in lumbar with flexion  Skin:  General: Skin is warm and dry.  Neurological:     Mental Status: She is alert and oriented to person, place, and time.     Cranial Nerves: No cranial nerve deficit.     Deep Tendon Reflexes: Reflexes are normal and symmetric.  Psychiatric:        Behavior: Behavior normal.        Thought Content: Thought content normal.        Judgment: Judgment normal.         BP 123/80    Pulse 69    Temp (!) 97.5 F (36.4  C) (Temporal)    Ht 5\' 4"  (1.626 m)    Wt 182 lb 6.4 oz (82.7 kg)    LMP 09/13/2008 (Approximate)    BMI 31.31 kg/m   Assessment & Plan:  Veronica Gomez comes in today with chief complaint of Medical Management of Chronic Issues and Back Pain   Diagnosis and orders addressed:  1. Low back pain without sciatica, unspecified back pain laterality, unspecified chronicity - baclofen (LIORESAL) 10 MG tablet; Take 1 tablet (10 mg total) by mouth 3 (three) times daily.  Dispense: 30 each; Refill: 0 - DG Lumbar Spine 2-3 Views; Future  2. Primary hypertension  3. Chronic heart disease  4. Gastroesophageal reflux disease, unspecified whether esophagitis present  5. Dyslipidemia  6. Obesity (BMI 30.0-34.9)    Labs reviewed from Cardiologists  Health Maintenance reviewed Diet and exercise encouraged  Follow up plan: 6 months    09-[birth year]-1992, FNP

## 2021-06-21 ENCOUNTER — Other Ambulatory Visit: Payer: Self-pay

## 2021-06-21 ENCOUNTER — Other Ambulatory Visit: Payer: Medicare Other

## 2021-06-21 ENCOUNTER — Ambulatory Visit (INDEPENDENT_AMBULATORY_CARE_PROVIDER_SITE_OTHER): Payer: Medicare Other

## 2021-06-21 DIAGNOSIS — M545 Low back pain, unspecified: Secondary | ICD-10-CM

## 2021-06-21 DIAGNOSIS — M47816 Spondylosis without myelopathy or radiculopathy, lumbar region: Secondary | ICD-10-CM | POA: Diagnosis not present

## 2021-06-27 ENCOUNTER — Other Ambulatory Visit: Payer: Self-pay

## 2021-06-27 DIAGNOSIS — I519 Heart disease, unspecified: Secondary | ICD-10-CM

## 2021-06-27 DIAGNOSIS — I255 Ischemic cardiomyopathy: Secondary | ICD-10-CM

## 2021-06-27 MED ORDER — EMPAGLIFLOZIN 10 MG PO TABS: 10.0000 mg | ORAL_TABLET | Freq: Every day | ORAL | 3 refills | Status: DC

## 2021-06-27 NOTE — Telephone Encounter (Signed)
Pt called in and stated that they needed a refill on jardiance sent to [facility] [address]. I will route the request to nl refill team

## 2021-06-28 ENCOUNTER — Other Ambulatory Visit: Payer: Self-pay

## 2021-06-28 ENCOUNTER — Encounter: Payer: Self-pay | Admitting: Family

## 2021-06-28 DIAGNOSIS — I255 Ischemic cardiomyopathy: Secondary | ICD-10-CM

## 2021-06-28 DIAGNOSIS — I519 Heart disease, unspecified: Secondary | ICD-10-CM

## 2021-06-28 MED ORDER — EMPAGLIFLOZIN 10 MG PO TABS: 10.0000 mg | ORAL_TABLET | Freq: Every day | ORAL | 3 refills | Status: DC

## 2021-07-01 ENCOUNTER — Telehealth: Payer: Self-pay | Admitting: Pharmacist

## 2021-07-01 NOTE — Telephone Encounter (Signed)
Called patient and LMOM to see how she was tolerating [place]

## 2021-07-08 ENCOUNTER — Ambulatory Visit: Payer: Medicare Other | Admitting: Family

## 2021-07-08 ENCOUNTER — Other Ambulatory Visit: Payer: Self-pay | Admitting: Family

## 2021-07-08 DIAGNOSIS — M1712 Unilateral primary osteoarthritis, left knee: Secondary | ICD-10-CM

## 2021-07-08 DIAGNOSIS — M5441 Lumbago with sciatica, right side: Secondary | ICD-10-CM

## 2021-07-08 DIAGNOSIS — G8929 Other chronic pain: Secondary | ICD-10-CM

## 2021-07-21 ENCOUNTER — Other Ambulatory Visit: Payer: Self-pay | Admitting: Family

## 2021-07-21 DIAGNOSIS — I10 Essential (primary) hypertension: Secondary | ICD-10-CM

## 2021-07-21 DIAGNOSIS — E785 Hyperlipidemia, unspecified: Secondary | ICD-10-CM

## 2021-07-26 ENCOUNTER — Telehealth: Payer: Self-pay

## 2021-07-26 NOTE — Telephone Encounter (Signed)
Called the pharmacy to check the status of entresto cost and they stated that it was $583 for 3 months. They stated that they have no idea why the cost went up drastically from the $47 that it was and they had advised her to call the insurance company directly. I will route the call to pavero as he asked me to retrieve this information.  ?

## 2021-07-26 NOTE — Telephone Encounter (Signed)
PT called in and stated that the entresto cost increased and was concerned about why it was going up. They stated that they hadn't filled out pt assistance because they do not think that they would qualify. I will route to chris pavero. I will mail out pt assistance forms if he deems necessary. She seemed concerned about placing her husbands income on the form.  ?

## 2021-07-26 NOTE — Telephone Encounter (Signed)
Can you contact pharmacy and see what copay is? ?

## 2021-08-01 ENCOUNTER — Other Ambulatory Visit: Payer: Self-pay | Admitting: Family

## 2021-08-01 DIAGNOSIS — K219 Gastro-esophageal reflux disease without esophagitis: Secondary | ICD-10-CM

## 2021-08-04 NOTE — Telephone Encounter (Signed)
Can you see if patient would qualify for patient assistance? ?

## 2021-08-04 NOTE — Telephone Encounter (Signed)
Lmom the pt that I would be mailing out pt assistance forms to see if she would qualify to get entresto for free.  ?

## 2021-08-10 ENCOUNTER — Ambulatory Visit: Payer: Medicare Other

## 2021-08-30 ENCOUNTER — Other Ambulatory Visit: Payer: Self-pay | Admitting: Family

## 2021-08-30 DIAGNOSIS — M545 Low back pain, unspecified: Secondary | ICD-10-CM

## 2021-09-15 ENCOUNTER — Telehealth: Payer: Self-pay | Admitting: Family

## 2021-09-15 NOTE — Telephone Encounter (Signed)
Left message for patient to call back and schedule Medicare Annual Wellness Visit (AWV) to be completed by video or phone.  ? ?Last AWV: 08/09/2020 ?  ?  ? ?Please schedule at anytime with [facility] Nurse Health Advisor    ? ?45 minute appointment ? ?Any questions, please contact me at [phone]  ?  ?  ?

## 2021-09-17 ENCOUNTER — Other Ambulatory Visit: Payer: Self-pay | Admitting: Family

## 2021-09-17 DIAGNOSIS — M545 Low back pain, unspecified: Secondary | ICD-10-CM

## 2021-10-01 ENCOUNTER — Other Ambulatory Visit: Payer: Self-pay | Admitting: Family

## 2021-10-01 DIAGNOSIS — G8929 Other chronic pain: Secondary | ICD-10-CM

## 2021-10-01 DIAGNOSIS — M1712 Unilateral primary osteoarthritis, left knee: Secondary | ICD-10-CM

## 2021-10-28 ENCOUNTER — Other Ambulatory Visit: Payer: Self-pay | Admitting: Family

## 2021-10-28 DIAGNOSIS — I10 Essential (primary) hypertension: Secondary | ICD-10-CM

## 2021-10-28 DIAGNOSIS — E785 Hyperlipidemia, unspecified: Secondary | ICD-10-CM

## 2021-10-31 ENCOUNTER — Other Ambulatory Visit: Payer: Medicare Other

## 2021-10-31 DIAGNOSIS — E785 Hyperlipidemia, unspecified: Secondary | ICD-10-CM | POA: Diagnosis not present

## 2021-11-01 LAB — LIPID PANEL
Chol/HDL Ratio: 3.6 ratio (ref 0.0–4.4)
Cholesterol, Total: 145 mg/dL (ref 100–199)
HDL: 40 mg/dL (ref >39)
LDL Chol Calc (NIH): 85 mg/dL (ref 0–99)
Triglycerides: 111 mg/dL (ref 0–149)
VLDL Cholesterol Cal: 20 mg/dL (ref 5–40)

## 2021-11-04 ENCOUNTER — Other Ambulatory Visit: Payer: Self-pay | Admitting: Family

## 2021-11-04 DIAGNOSIS — K219 Gastro-esophageal reflux disease without esophagitis: Secondary | ICD-10-CM

## 2021-11-07 ENCOUNTER — Encounter: Payer: Self-pay | Admitting: Internal Medicine

## 2021-11-07 ENCOUNTER — Ambulatory Visit: Payer: Medicare Other | Admitting: Internal Medicine

## 2021-11-07 VITALS — BP 138/88 | HR 64 | Ht 65.0 in | Wt 175.2 lb

## 2021-11-07 DIAGNOSIS — I10 Essential (primary) hypertension: Secondary | ICD-10-CM | POA: Diagnosis not present

## 2021-11-07 DIAGNOSIS — I447 Left bundle-branch block, unspecified: Secondary | ICD-10-CM

## 2021-11-07 DIAGNOSIS — I255 Ischemic cardiomyopathy: Secondary | ICD-10-CM | POA: Diagnosis not present

## 2021-11-07 DIAGNOSIS — E785 Hyperlipidemia, unspecified: Secondary | ICD-10-CM

## 2021-11-07 NOTE — Progress Notes (Signed)
OFFICE NOTE  Chief Complaint:  No complaints  Primary Care Physician: Junie Spencer, FNP  HPI:  Veronica Gomez is a pleasant 66 year old female who moved to [place] in January of this year. Her past echo history is significant for hypertension, dyslipidemia and a former smoker, but quit over 25 years ago. 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 In 2009 she was noted to have an interventricular conduction delay, and was referred for stress test which was negative for ischemia. Records indicate 2013 she was having some chest discomfort and had a new, clear left bundle branch block. She underwent a nuclear stress test shows a fixed inferoapical defect. EF was 56%. An echocardiogram performed around the same time showed an EF of 40% with apical inferior wall hypokinesis. This was determined to be scar, no further workup was recommended as her symptoms were not thought to be due to ischemia. No ischemia was noted on her stress test. She is on appropriate medical therapy including aspirin, statin, ACE inhibitor, beta blocker and diuretic. She is quite active around her house and into your and denies any chest pain or worsening shortness of breath. Her weight has been stable. Her last office visit with her cardiologist was in December 2014 no changes to her medical regimen were recommended at that time. She did have a recent lipid profile which showed total cholesterol 162 and LDL of 99.  I saw Veronica Gomez back in the office today. She has no new complaints. We recently did lipid testing which shows her LDL cholesterol to be 99. That is still higher than her goal LDL of less than 70. We talked about possibly adjusting her Lipitor up however she reported she had side effects with that in the past.  Veronica Gomez returns today for follow-up. Overall she is feeling well. She denies any worsening shortness of breath or chest pain. Blood pressure has been very well  controlled. Recently she had discussed medicine changes which were planning based on her cholesterol not being quite at goal. She was concerned about myalgias on higher dose statin and therefore I recommended adding Avandia. Alternatively her primary care provider suggested she could take a double dose of Lipitor every other day. She's been doing that for at least several months and is due for recheck of her cholesterol. She currently denies any myalgias or myopathy  I had the pleasure seeing Veronica Gomez back today in the office. Slightly earlier than our normal follow-up appointment. She's changed insurance companies and is interested in starting some more exercise. She denies any chest pain but does get short of breath with activity. She was due for repeat echocardiogram which I like to get to see if she's had any change in her LV function. She is currently on aspirin, Lipitor, carvedilol and lisinopril.  08/25/2016  Veronica Gomez returns today for follow-up. Overall she is doing well. She denies any worsening shortness of breath or chest pain. She has NYHA class I symptoms. Based on this she does not qualify for Entresto. She is currently on carvedilol and lisinopril although but has been having recent cough. I think this is allergies but her PCP did write to switch her from lisinopril over to losartan. She reports some rhinitis symptoms as well as itchy eyes and no significant pollen burden now. I suggested she may benefit from using a nasal steroid and saline nasal spray. She's currently using cetirizine and diphenhydramine. She should  avoid any decongestants such as pseudoephedrine.  12/01/2019  Veronica Gomez is seen today in follow-up.  I last saw her via virtual visit during the pandemic about a year ago.  Since then she has done well.  She denies any worsening shortness of breath or chest pain.  She reports her weight has been up or down but is a few pounds lower than it was before.  She is overdue for a  repeat lipid profile.  I previously increased her atorvastatin up to 40 mg to try to reach a target LDL less than 70.  Also her last echocardiogram was in 2017 again showing an ischemic cardiomyopathy with EF of 40%.  She continues to have NYHA class I symptoms.  01/28/2021  Veronica Gomez returns today for follow-up.  LVEF has remained low at 40 to 45% with some regional wall motion abnormalities however compared to to a prior study in 2017 is stable.  She is on historic heart failure medications but is not on any of the newer guideline directed medical therapies.  We discussed the possibility of switching her ARB to [facility] today and the fact that it might be helpful with improving her LV function although I do believe it is possibly related to bundle branch block.  11/07/2021  Veronica Gomez is seen today in follow-up.  Overall she seems to be doing well.  She had seen Jari Favre, PA-C in follow-up in January.  Her Sherryll Burger has been further titrated.  Then she was seen by our pharmacist and started on Jardiance as she was unable to tolerate [place].  She seems to be doing well on the Jardiance.  Blood pressures at home have been solidly well controlled in the 120s over 80s.  She has no more than NYHA class I symptoms.  PMHx:  Past Medical History:  Diagnosis Date   CAD (coronary artery disease)    Cardiomyopathy (HCC)    GERD (gastroesophageal reflux disease)    Hyperlipidemia    Hypertension    Ischemia    Left bundle branch block (LBBB)     Past Surgical History:  Procedure Laterality Date   CERVICAL POLYPECTOMY  2010   FINGER SURGERY  2008   SHOULDER SURGERY Right 2014   TUBAL LIGATION  1988    FAMHx:  Family History  Problem Relation Age of Onset   Osteoporosis Mother    Asthma Mother    Hypertension Mother    Hypertension Father    Heart disease Father        MI    SOCHx:   reports that she quit smoking about 33 years ago. Her smoking use included cigarettes. She has never  used smokeless tobacco. She reports current alcohol use. She reports that she does not use drugs.  ALLERGIES:  Allergies  Allergen Reactions   Farxiga [Dapagliflozin]     myalgias    ROS: Pertinent items noted in HPI and remainder of comprehensive ROS otherwise negative.  HOME MEDS: Current Outpatient Medications  Medication Sig Dispense Refill   Ascorbic Acid (VITAMIN C PO) Vitamin C Chewable 500 mg tablet   1 tablet every day by oral route.     aspirin 81 MG tablet Take 81 mg by mouth daily.     atorvastatin (LIPITOR) 40 MG tablet Take 1 tablet (40 mg total) by mouth daily. 100 tablet 0   baclofen (LIORESAL) 10 MG tablet TAKE 1 TABLET BY MOUTH THREE TIMES A DAY 270 tablet 0   carvedilol (COREG) 6.25 MG  tablet TAKE 1 TABLET BY MOUTH TWICE  DAILY WITH A MEAL 200 tablet 0   Cholecalciferol (VITAMIN D3) 25 MCG (1000 UT) CAPS Take by mouth daily.     diclofenac (VOLTAREN) 75 MG EC tablet TAKE 1 TABLET BY MOUTH TWICE  DAILY 180 tablet 0   empagliflozin (JARDIANCE) 10 MG TABS tablet Take 1 tablet (10 mg total) by mouth daily. 90 tablet 3   hydrochlorothiazide (HYDRODIURIL) 25 MG tablet Take 25 mg by mouth daily.     losartan (COZAAR) 100 MG tablet      omeprazole (PRILOSEC) 20 MG capsule TAKE 1 CAPSULE BY MOUTH DAILY 100 capsule 0   sacubitril-valsartan (ENTRESTO) 49-51 MG Take 1 tablet by mouth 2 (two) times daily. 180 tablet 1   No current facility-administered medications for this visit.    LABS/IMAGING: No results found for this or any previous visit (from the past 48 hour(s)). No results found.  VITALS: BP 138/88   Pulse 64   Ht 5\' 5"  (1.651 m)   Wt 175 lb 3.2 oz (79.5 kg)   LMP 09/13/2008 (Approximate)   SpO2 99%   BMI 29.15 kg/m   EXAM: General appearance: alert and no distress Neck: no carotid bruit and no JVD Lungs: clear to auscultation bilaterally Heart: regular rate and rhythm, S1, S2 normal, no murmur, click, rub or gallop Abdomen: soft, non-tender; bowel  sounds normal; no masses,  no organomegaly Extremities: extremities normal, atraumatic, no cyanosis or edema Pulses: 2+ and symmetric Skin: Skin color, texture, turgor normal. No rashes or lesions Neurologic: Grossly normal Psych: Mood, affect normal  EKG: Normal sinus rhythm at 64, LBBB, QRSD 152 ms -personally reviewed  ASSESSMENT: Ischemic cardiomyopathy, EF between 40 and 50% Fixed inferoapical defect suggestive of scar on prior nuclear stress testing Chronic LBBB Hypertension Dyslipidemia Former smoker  PLAN: 1.   Veronica Gomez continues to do well without any worsening chest pain or shortness of breath.  LVEF has remained in the low 40 to 45% range.  She has had an up titration of her medications including the Entresto and recently on Jardiance.  She also takes carvedilol.  Blood pressure is well controlled.  LDL recently was 85 on 40 mg of atorvastatin.  Although she has an ischemic cardiomyopathy this is based on a fixed defect on her Myoview.  This could be related to left bundle branch block and it may be that she has a nonischemic cardiomyopathy.  She has never had a cath.  We will continue with medical therapy.  Plan repeat echo in about 6 months.  If her EF continues to be not well controlled, will consider ischemic evaluation.  I do not think at this point she will qualify for CRT therapy.  Chrystie Nose, MD, [facility], FACP  [place]  [facility]  Medical Director of the Advanced Lipid Disorders &  Cardiovascular Risk Reduction Clinic Diplomate of the American Board of Clinical Lipidology Attending Cardiologist  Direct Dial: [phone]  Fax: [phone]  Website:  [email or web address] 11/07/2021, 9:16 AM

## 2021-12-22 ENCOUNTER — Other Ambulatory Visit: Payer: Self-pay | Admitting: Family

## 2021-12-22 DIAGNOSIS — G8929 Other chronic pain: Secondary | ICD-10-CM

## 2021-12-22 DIAGNOSIS — M1712 Unilateral primary osteoarthritis, left knee: Secondary | ICD-10-CM

## 2021-12-22 NOTE — Telephone Encounter (Signed)
Scheduled appt 01/12/22

## 2021-12-22 NOTE — Telephone Encounter (Signed)
[facility] NTBS for 6 mos FU in Aug. Mail order sent

## 2021-12-30 ENCOUNTER — Telehealth: Payer: Self-pay | Admitting: Internal Medicine

## 2021-12-30 NOTE — Telephone Encounter (Signed)
Pt c/o medication issue:  1. Name of Medication:   empagliflozin (JARDIANCE) 10 MG TABS tablet    2. How are you currently taking this medication (dosage and times per day)? Take 1 tablet (10 mg total) by mouth daily.  3. Are you having a reaction (difficulty breathing--STAT)? No  4. What is your medication issue? Pt states that she think medication is causing her to have a yeast infection. Pt would like a callback regarding this matter. Please advise

## 2021-12-30 NOTE — Telephone Encounter (Signed)
Returned the call to the patient. She stated that she has been having issues with yeast infections and wonders if this could be from the [place].

## 2022-01-01 ENCOUNTER — Other Ambulatory Visit: Payer: Self-pay | Admitting: Family

## 2022-01-01 DIAGNOSIS — I10 Essential (primary) hypertension: Secondary | ICD-10-CM

## 2022-01-01 DIAGNOSIS — E785 Hyperlipidemia, unspecified: Secondary | ICD-10-CM

## 2022-01-01 NOTE — Telephone Encounter (Signed)
Could be related - would advise stopping the medicine and seek out treatment from her PCP or GYN for yeast infection.  Dr Rexene Edison

## 2022-01-02 NOTE — Telephone Encounter (Signed)
Attempted to reach the patient. Was unable to leave a message 

## 2022-01-02 NOTE — Telephone Encounter (Signed)
Patient has been made aware. She will stop the Jardiance and call back in a few weeks if symptoms subside. She will also reach out to her PCP.

## 2022-01-12 ENCOUNTER — Ambulatory Visit: Payer: Medicare Other | Admitting: Family

## 2022-01-26 ENCOUNTER — Telehealth: Payer: Self-pay | Admitting: Family

## 2022-01-26 DIAGNOSIS — Z1231 Encounter for screening mammogram for malignant neoplasm of breast: Secondary | ICD-10-CM | POA: Diagnosis not present

## 2022-01-26 MED ORDER — HYDROCHLOROTHIAZIDE 25 MG PO TABS: 25.0000 mg | ORAL_TABLET | Freq: Every day | ORAL | 1 refills | Status: DC

## 2022-01-26 NOTE — Telephone Encounter (Signed)
  Prescription Request  01/26/2022  Is this a "Controlled Substance" medicine? no  Have you seen your PCP in the last 2 weeks? no  If YES, route message to pool  -  If NO, patient needs to be scheduled for appointment.  What is the name of the medication or equipment? Hydrochlorithyzide  Have you contacted your pharmacy to request a refill? no   Which pharmacy would you like this sent to? [facility]   Patient notified that their request is being sent to the clinical staff for review and that they should receive a response within 2 business days.    Veronica Gomez' pt.  It use to be mail order RX, but she wants t changed to [facility].

## 2022-01-26 NOTE — Telephone Encounter (Signed)
Prescription sent to pharmacy.

## 2022-01-26 NOTE — Telephone Encounter (Signed)
HCTZ is historical med-not rx'd by [place]  Last OV 06/20/21. Last RF ?Marland Kitchen Next OV 02/09/22

## 2022-01-30 ENCOUNTER — Ambulatory Visit: Payer: Medicare Other | Admitting: Family

## 2022-02-01 ENCOUNTER — Other Ambulatory Visit: Payer: Self-pay | Admitting: Family Medicine

## 2022-02-02 ENCOUNTER — Other Ambulatory Visit: Payer: Self-pay | Admitting: Family

## 2022-02-02 DIAGNOSIS — K219 Gastro-esophageal reflux disease without esophagitis: Secondary | ICD-10-CM

## 2022-02-03 ENCOUNTER — Ambulatory Visit (INDEPENDENT_AMBULATORY_CARE_PROVIDER_SITE_OTHER): Payer: Medicare Other | Admitting: Family

## 2022-02-03 ENCOUNTER — Encounter: Payer: Self-pay | Admitting: Family

## 2022-02-03 VITALS — BP 132/77 | HR 67 | Temp 97.7°F | Ht 65.0 in | Wt 172.8 lb

## 2022-02-03 DIAGNOSIS — K219 Gastro-esophageal reflux disease without esophagitis: Secondary | ICD-10-CM | POA: Diagnosis not present

## 2022-02-03 DIAGNOSIS — Z0000 Encounter for general adult medical examination without abnormal findings: Secondary | ICD-10-CM

## 2022-02-03 DIAGNOSIS — Z23 Encounter for immunization: Secondary | ICD-10-CM

## 2022-02-03 DIAGNOSIS — G8929 Other chronic pain: Secondary | ICD-10-CM | POA: Diagnosis not present

## 2022-02-03 DIAGNOSIS — Z0001 Encounter for general adult medical examination with abnormal findings: Secondary | ICD-10-CM | POA: Diagnosis not present

## 2022-02-03 DIAGNOSIS — I255 Ischemic cardiomyopathy: Secondary | ICD-10-CM | POA: Diagnosis not present

## 2022-02-03 DIAGNOSIS — M545 Low back pain, unspecified: Secondary | ICD-10-CM | POA: Diagnosis not present

## 2022-02-03 DIAGNOSIS — E785 Hyperlipidemia, unspecified: Secondary | ICD-10-CM

## 2022-02-03 DIAGNOSIS — E66811 Obesity, class 1: Secondary | ICD-10-CM

## 2022-02-03 DIAGNOSIS — I10 Essential (primary) hypertension: Secondary | ICD-10-CM

## 2022-02-03 DIAGNOSIS — E669 Obesity, unspecified: Secondary | ICD-10-CM

## 2022-02-03 NOTE — Patient Instructions (Signed)

## 2022-02-03 NOTE — Progress Notes (Signed)
Subjective:    Patient ID: Veronica Gomez, female    DOB: Jan 25, 1956, 66 y.o.   MRN: [id number]  Chief Complaint  Patient presents with   Medical Management of Chronic Issues   Pt presents to the office today for chronic follow up. She is followed by Cardiologists every 6 months for HTN and cardiomyopathy. Hypertension This is a chronic problem. The current episode started more than 1 year ago. The problem has been resolved since onset. The problem is controlled. Pertinent negatives include no malaise/fatigue, peripheral edema or shortness of breath. Risk factors for coronary artery disease include dyslipidemia, obesity and sedentary lifestyle. The current treatment provides moderate improvement. There is no history of CVA or heart failure.  Gastroesophageal Reflux She complains of belching and heartburn. This is a chronic problem. The current episode started more than 1 year ago. The problem occurs occasionally. She has tried a PPI for the symptoms. The treatment provided moderate relief.  Hyperlipidemia This is a chronic problem. The current episode started more than 1 year ago. The problem is controlled. Exacerbating diseases include obesity. Pertinent negatives include no shortness of breath. Current antihyperlipidemic treatment includes statins. The current treatment provides moderate improvement of lipids. Risk factors for coronary artery disease include dyslipidemia, a sedentary lifestyle, hypertension and post-menopausal.  Back Pain This is a chronic problem. The current episode started more than 1 year ago. The problem has been waxing and waning since onset. The pain is present in the lumbar spine. The quality of the pain is described as aching. The pain is at a severity of 6/10. The pain is moderate. The symptoms are aggravated by twisting and lying down. She has tried muscle relaxant and NSAIDs for the symptoms. The treatment provided mild relief.      Review of Systems   Constitutional:  Negative for malaise/fatigue.  Respiratory:  Negative for shortness of breath.   Gastrointestinal:  Positive for heartburn.  Musculoskeletal:  Positive for back pain.  All other systems reviewed and are negative.      Family History  Problem Relation Age of Onset   Osteoporosis Mother    Asthma Mother    Hypertension Mother    Hypertension Father    Heart disease Father        MI   Social History   Socioeconomic History   Marital status: Married    Spouse name: Not on file   Number of children: Not on file   Years of education: Not on file   Highest education level: Not on file  Occupational History   Not on file  Tobacco Use   Smoking status: Former    Types: Cigarettes    Quit date: 05/15/1988    Years since quitting: 33.7   Smokeless tobacco: Never  Vaping Use   Vaping Use: Never used  Substance and Sexual Activity   Alcohol use: Yes    Comment: occasionally - beer   Drug use: No   Sexual activity: Yes    Birth control/protection: Post-menopausal  Other Topics Concern   Not on file  Social History Narrative   Not on file   Social Determinants of Health   Financial Resource Strain: Not on file  Food Insecurity: Not on file  Transportation Needs: Not on file  Physical Activity: Not on file  Stress: Not on file  Social Connections: Not on file    Objective:   Physical Exam Vitals reviewed.  Constitutional:      General: She is not in  acute distress.    Appearance: She is well-developed.  HENT:     Head: Normocephalic and atraumatic.     Right Ear: Tympanic membrane normal.     Left Ear: Tympanic membrane normal.  Eyes:     Pupils: Pupils are equal, round, and reactive to light.  Neck:     Thyroid: No thyromegaly.  Cardiovascular:     Rate and Rhythm: Normal rate and regular rhythm.     Heart sounds: Normal heart sounds. No murmur heard. Pulmonary:     Effort: Pulmonary effort is normal. No respiratory distress.     Breath  sounds: Normal breath sounds. No wheezing.  Abdominal:     General: Bowel sounds are normal. There is no distension.     Palpations: Abdomen is soft.     Tenderness: There is no abdominal tenderness.  Musculoskeletal:        General: No tenderness. Normal range of motion.     Cervical back: Normal range of motion and neck supple.  Skin:    General: Skin is warm and dry.  Neurological:     Mental Status: She is alert and oriented to person, place, and time.     Cranial Nerves: No cranial nerve deficit.     Deep Tendon Reflexes: Reflexes are normal and symmetric.  Psychiatric:        Behavior: Behavior normal.        Thought Content: Thought content normal.        Judgment: Judgment normal.     BP 132/77   Pulse 67   Temp 97.7 F (36.5 C) (Temporal)   Ht $R'5\' 5"'qG$  (1.651 m)   Wt 172 lb 12.8 oz (78.4 kg)   LMP 09/13/2008 (Approximate)   BMI 28.76 kg/m       Assessment & Plan:  Charelle Petrakis comes in today with chief complaint of Medical Management of Chronic Issues   Diagnosis and orders addressed:  1. Need for immunization against influenza - Flu Vaccine QUAD High Dose(Fluad) - CMP14+EGFR - CBC with Differential/Platelet  2. Annual physical exam - CMP14+EGFR - CBC with Differential/Platelet - Lipid panel - TSH  3. Primary hypertension - CMP14+EGFR - CBC with Differential/Platelet  4. Cardiomyopathy, ischemic - CMP14+EGFR - CBC with Differential/Platelet  5. Gastroesophageal reflux disease, unspecified whether esophagitis present - CMP14+EGFR - CBC with Differential/Platelet  6. Dyslipidemia - CMP14+EGFR - CBC with Differential/Platelet - Lipid panel  7. Obesity (BMI 30.0-34.9) - CMP14+EGFR - CBC with Differential/Platelet   Labs pending Health Maintenance reviewed Diet and exercise encouraged  Follow up plan: 6 year   [name], FNP

## 2022-02-04 LAB — CBC WITH DIFFERENTIAL/PLATELET
Basophils Absolute: 0 10*3/uL (ref 0.0–0.2)
Basos: 1 %
EOS (ABSOLUTE): 0.3 10*3/uL (ref 0.0–0.4)
Eos: 5 %
Hematocrit: 42.5 % (ref 34.0–46.6)
Hemoglobin: 13.9 g/dL (ref 11.1–15.9)
Immature Grans (Abs): 0 10*3/uL (ref 0.0–0.1)
Immature Granulocytes: 0 %
Lymphocytes Absolute: 1.8 10*3/uL (ref 0.7–3.1)
Lymphs: 31 %
MCH: 31.8 pg (ref 26.6–33.0)
MCHC: 32.7 g/dL (ref 31.5–35.7)
MCV: 97 fL (ref 79–97)
Monocytes Absolute: 0.4 10*3/uL (ref 0.1–0.9)
Monocytes: 6 %
Neutrophils Absolute: 3.5 10*3/uL (ref 1.4–7.0)
Neutrophils: 57 %
Platelets: 285 10*3/uL (ref 150–450)
RBC: 4.37 x10E6/uL (ref 3.77–5.28)
RDW: 12.3 % (ref 11.7–15.4)
WBC: 6 10*3/uL (ref 3.4–10.8)

## 2022-02-04 LAB — LIPID PANEL
Chol/HDL Ratio: 3.3 ratio (ref 0.0–4.4)
Cholesterol, Total: 154 mg/dL (ref 100–199)
HDL: 47 mg/dL (ref >39)
LDL Chol Calc (NIH): 78 mg/dL (ref 0–99)
Triglycerides: 171 mg/dL — ABNORMAL HIGH (ref 0–149)
VLDL Cholesterol Cal: 29 mg/dL (ref 5–40)

## 2022-02-04 LAB — CMP14+EGFR
ALT: 18 IU/L (ref 0–32)
AST: 16 IU/L (ref 0–40)
Albumin/Globulin Ratio: 2 (ref 1.2–2.2)
Albumin: 4.4 g/dL (ref 3.9–4.9)
Alkaline Phosphatase: 62 IU/L (ref 44–121)
BUN/Creatinine Ratio: 20 (ref 12–28)
BUN: 22 mg/dL (ref 8–27)
Bilirubin Total: 0.5 mg/dL (ref 0.0–1.2)
CO2: 24 mmol/L (ref 20–29)
Calcium: 9.5 mg/dL (ref 8.7–10.3)
Chloride: 102 mmol/L (ref 96–106)
Creatinine, Ser: 1.09 mg/dL — ABNORMAL HIGH (ref 0.57–1.00)
Globulin, Total: 2.2 g/dL (ref 1.5–4.5)
Glucose: 91 mg/dL (ref 70–99)
Potassium: 3.8 mmol/L (ref 3.5–5.2)
Sodium: 146 mmol/L — ABNORMAL HIGH (ref 134–144)
Total Protein: 6.6 g/dL (ref 6.0–8.5)
eGFR: 55 mL/min/{1.73_m2} — ABNORMAL LOW (ref >59)

## 2022-02-04 LAB — TSH: TSH: 1.03 u[IU]/mL (ref 0.450–4.500)

## 2022-02-09 ENCOUNTER — Other Ambulatory Visit: Payer: Self-pay | Admitting: Internal Medicine

## 2022-02-09 DIAGNOSIS — I255 Ischemic cardiomyopathy: Secondary | ICD-10-CM

## 2022-02-13 ENCOUNTER — Other Ambulatory Visit: Payer: Self-pay | Admitting: Family

## 2022-02-13 DIAGNOSIS — M545 Low back pain, unspecified: Secondary | ICD-10-CM

## 2022-03-09 DIAGNOSIS — L821 Other seborrheic keratosis: Secondary | ICD-10-CM | POA: Diagnosis not present

## 2022-03-26 ENCOUNTER — Other Ambulatory Visit: Payer: Self-pay | Admitting: Family

## 2022-03-26 DIAGNOSIS — I10 Essential (primary) hypertension: Secondary | ICD-10-CM

## 2022-03-26 DIAGNOSIS — E785 Hyperlipidemia, unspecified: Secondary | ICD-10-CM

## 2022-03-26 DIAGNOSIS — M1712 Unilateral primary osteoarthritis, left knee: Secondary | ICD-10-CM

## 2022-03-26 DIAGNOSIS — G8929 Other chronic pain: Secondary | ICD-10-CM

## 2022-04-13 ENCOUNTER — Other Ambulatory Visit: Payer: Self-pay | Admitting: Family

## 2022-04-13 DIAGNOSIS — K219 Gastro-esophageal reflux disease without esophagitis: Secondary | ICD-10-CM

## 2022-04-20 ENCOUNTER — Ambulatory Visit (INDEPENDENT_AMBULATORY_CARE_PROVIDER_SITE_OTHER): Payer: Medicare Other

## 2022-04-20 DIAGNOSIS — I255 Ischemic cardiomyopathy: Secondary | ICD-10-CM

## 2022-04-20 DIAGNOSIS — I447 Left bundle-branch block, unspecified: Secondary | ICD-10-CM

## 2022-04-20 LAB — ECHOCARDIOGRAM COMPLETE
AR max vel: 2.49 cm2
AV Area VTI: 2.36 cm2
AV Area mean vel: 2.37 cm2
AV Mean grad: 3 mmHg
AV Peak grad: 4.8 mmHg
Ao pk vel: 1.09 m/s
Calc EF: 47.2 %
MV M vel: 3.42 m/s
MV Peak grad: 46.8 mmHg
S' Lateral: 1.86 cm
Single Plane A2C EF: 57.4 %
Single Plane A4C EF: 36.6 %

## 2022-04-24 NOTE — Progress Notes (Signed)
[facility] Quality Team Note  Name: Veronica Gomez Date of Birth: Jun 07, 1955 MRN: [id number] Date: 04/24/2022  [facility] Quality Team has reviewed this patient's chart, please see recommendations below:  [facility] Quality Other; (KED: Kidney Health Evaluation Gap- Patient needs Urine Albumin Creatinine Ratio Test completed for gap closure. EGFR has already been completed).

## 2022-04-26 ENCOUNTER — Ambulatory Visit (INDEPENDENT_AMBULATORY_CARE_PROVIDER_SITE_OTHER): Payer: Medicare Other

## 2022-04-26 VITALS — Ht 65.0 in | Wt 172.0 lb

## 2022-04-26 DIAGNOSIS — Z0000 Encounter for general adult medical examination without abnormal findings: Secondary | ICD-10-CM | POA: Diagnosis not present

## 2022-04-26 NOTE — Patient Instructions (Signed)
Veronica Gomez , Thank you for taking time to come for your Medicare Wellness Visit. I appreciate your ongoing commitment to your health goals. Please review the following plan we discussed and let me know if I can assist you in the future.   These are the goals we discussed:  Goals      Exercise 150 min/wk Moderate Activity        This is a list of the screening recommended for you and due dates:  Health Maintenance  Topic Date Due   Cologuard (Stool DNA test)  Never done   DEXA scan (bone density measurement)  10/03/2021   COVID-19 Vaccine (3 - 2023-24 season) 01/13/2022   Zoster (Shingles) Vaccine (1 of 2) 05/05/2022*   Mammogram  01/21/2023   Medicare Annual Wellness Visit  04/27/2023   DTaP/Tdap/Td vaccine (3 - Td or Tdap) 07/26/2027   Pneumonia Vaccine  Completed   Flu Shot  Completed   Hepatitis C Screening: USPSTF Recommendation to screen - Ages 18-79 yo.  Completed   HPV Vaccine  Aged Out   Colon Cancer Screening  Discontinued  *Topic was postponed. The date shown is not the original due date.    Advanced directives: Please bring a copy of your health care power of attorney and living will to the office to be added to your chart at your convenience.   Conditions/risks identified: Aim for 30 minutes of exercise or brisk walking, 6-8 glasses of water, and 5 servings of fruits and vegetables each day.   Next appointment: Follow up in one year for your annual wellness visit    Preventive Care 65 Years and Older, Female Preventive care refers to lifestyle choices and visits with your health care provider that can promote health and wellness. What does preventive care include? A yearly physical exam. This is also called an annual well check. Dental exams once or twice a year. Routine eye exams. Ask your health care provider how often you should have your eyes checked. Personal lifestyle choices, including: Daily care of your teeth and gums. Regular physical activity. Eating  a healthy diet. Avoiding tobacco and drug use. Limiting alcohol use. Practicing safe sex. Taking low-dose aspirin every day. Taking vitamin and mineral supplements as recommended by your health care provider. What happens during an annual well check? The services and screenings done by your health care provider during your annual well check will depend on your age, overall health, lifestyle risk factors, and family history of disease. Counseling  Your health care provider may ask you questions about your: Alcohol use. Tobacco use. Drug use. Emotional well-being. Home and relationship well-being. Sexual activity. Eating habits. History of falls. Memory and ability to understand (cognition). Work and work Astronomer. Reproductive health. Screening  You may have the following tests or measurements: Height, weight, and BMI. Blood pressure. Lipid and cholesterol levels. These may be checked every 5 years, or more frequently if you are over 14 years old. Skin check. Lung cancer screening. You may have this screening every year starting at age 42 if you have a 30-pack-year history of smoking and currently smoke or have quit within the past 15 years. Fecal occult blood test (FOBT) of the stool. You may have this test every year starting at age 72. Flexible sigmoidoscopy or colonoscopy. You may have a sigmoidoscopy every 5 years or a colonoscopy every 10 years starting at age 65. Hepatitis C blood test. Hepatitis B blood test. Sexually transmitted disease (STD) testing. Diabetes screening. This is  done by checking your blood sugar (glucose) after you have not eaten for a while (fasting). You may have this done every 1-3 years. Bone density scan. This is done to screen for osteoporosis. You may have this done starting at age 21. Mammogram. This may be done every 1-2 years. Talk to your health care provider about how often you should have regular mammograms. Talk with your health care  provider about your test results, treatment options, and if necessary, the need for more tests. Vaccines  Your health care provider may recommend certain vaccines, such as: Influenza vaccine. This is recommended every year. Tetanus, diphtheria, and acellular pertussis (Tdap, Td) vaccine. You may need a Td booster every 10 years. Zoster vaccine. You may need this after age 18. Pneumococcal 13-valent conjugate (PCV13) vaccine. One dose is recommended after age 71. Pneumococcal polysaccharide (PPSV23) vaccine. One dose is recommended after age 30. Talk to your health care provider about which screenings and vaccines you need and how often you need them. This information is not intended to replace advice given to you by your health care provider. Make sure you discuss any questions you have with your health care provider. Document Released: 05/28/2015 Document Revised: 01/19/2016 Document Reviewed: 03/02/2015 Elsevier Interactive Patient Education  2017 [place] Prevention in the Home Falls can cause injuries. They can happen to people of all ages. There are many things you can do to make your home safe and to help prevent falls. What can I do on the outside of my home? Regularly fix the edges of walkways and driveways and fix any cracks. Remove anything that might make you trip as you walk through a door, such as a raised step or threshold. Trim any bushes or trees on the path to your home. Use bright outdoor lighting. Clear any walking paths of anything that might make someone trip, such as rocks or tools. Regularly check to see if handrails are loose or broken. Make sure that both sides of any steps have handrails. Any raised decks and porches should have guardrails on the edges. Have any leaves, snow, or ice cleared regularly. Use sand or salt on walking paths during winter. Clean up any spills in your garage right away. This includes oil or grease spills. What can I do in the  bathroom? Use night lights. Install grab bars by the toilet and in the tub and shower. Do not use towel bars as grab bars. Use non-skid mats or decals in the tub or shower. If you need to sit down in the shower, use a plastic, non-slip stool. Keep the floor dry. Clean up any water that spills on the floor as soon as it happens. Remove soap buildup in the tub or shower regularly. Attach bath mats securely with double-sided non-slip rug tape. Do not have throw rugs and other things on the floor that can make you trip. What can I do in the bedroom? Use night lights. Make sure that you have a light by your bed that is easy to reach. Do not use any sheets or blankets that are too big for your bed. They should not hang down onto the floor. Have a firm chair that has side arms. You can use this for support while you get dressed. Do not have throw rugs and other things on the floor that can make you trip. What can I do in the kitchen? Clean up any spills right away. Avoid walking on wet floors. Keep items that you  use a lot in easy-to-reach places. If you need to reach something above you, use a strong step stool that has a grab bar. Keep electrical cords out of the way. Do not use floor polish or wax that makes floors slippery. If you must use wax, use non-skid floor wax. Do not have throw rugs and other things on the floor that can make you trip. What can I do with my stairs? Do not leave any items on the stairs. Make sure that there are handrails on both sides of the stairs and use them. Fix handrails that are broken or loose. Make sure that handrails are as long as the stairways. Check any carpeting to make sure that it is firmly attached to the stairs. Fix any carpet that is loose or worn. Avoid having throw rugs at the top or bottom of the stairs. If you do have throw rugs, attach them to the floor with carpet tape. Make sure that you have a light switch at the top of the stairs and the  bottom of the stairs. If you do not have them, ask someone to add them for you. What else can I do to help prevent falls? Wear shoes that: Do not have high heels. Have rubber bottoms. Are comfortable and fit you well. Are closed at the toe. Do not wear sandals. If you use a stepladder: Make sure that it is fully opened. Do not climb a closed stepladder. Make sure that both sides of the stepladder are locked into place. Ask someone to hold it for you, if possible. Clearly mark and make sure that you can see: Any grab bars or handrails. First and last steps. Where the edge of each step is. Use tools that help you move around (mobility aids) if they are needed. These include: Canes. Walkers. Scooters. Crutches. Turn on the lights when you go into a dark area. Replace any light bulbs as soon as they burn out. Set up your furniture so you have a clear path. Avoid moving your furniture around. If any of your floors are uneven, fix them. If there are any pets around you, be aware of where they are. Review your medicines with your doctor. Some medicines can make you feel dizzy. This can increase your chance of falling. Ask your doctor what other things that you can do to help prevent falls. This information is not intended to replace advice given to you by your health care provider. Make sure you discuss any questions you have with your health care provider. Document Released: 02/25/2009 Document Revised: 10/07/2015 Document Reviewed: 06/05/2014 Elsevier Interactive Patient Education  2017 [employer].

## 2022-04-26 NOTE — Progress Notes (Signed)
Subjective:   Veronica Gomez is a 66 y.o. female who presents for Medicare Annual (Subsequent) preventive examination.  I connected with  [name] on 04/26/22 by a audio enabled telemedicine application and verified that I am speaking with the correct person using two identifiers.  Patient Location: Home  Provider Location: Office/Clinic  I discussed the limitations of evaluation and management by telemedicine. The patient expressed understanding and agreed to proceed.   Review of Systems     Cardiac Risk Factors include: advanced age (>76mn, >>35women);dyslipidemia;hypertension     Objective:    Today's Vitals   04/26/22 1342  Weight: 172 lb (78 kg)  Height: _0  (1.651 m)   Body mass index is 28.62 kg/m.     04/26/2022    1:51 PM 08/09/2020    8:57 AM  Advanced Directives  Does Patient Have a Medical Advance Directive? Yes Yes  Type of Advance Directive Living will;[place]care [place]  Does patient want to make changes to medical advance directive? No - Patient declined No - Patient declined  Copy of [place] Chart? No - copy requested     Current Medications (verified) Outpatient Encounter Medications as of 04/26/2022  Medication Sig   Ascorbic Acid (VITAMIN C PO) Vitamin C Chewable 500 mg tablet   1 tablet every day by oral route.   aspirin 81 MG tablet Take 81 mg by mouth daily.   atorvastatin (LIPITOR) 40 MG tablet TAKE 1 TABLET BY MOUTH DAILY   baclofen (LIORESAL) 10 MG tablet TAKE 1 TABLET BY MOUTH THREE TIMES A DAY   carvedilol (COREG) 6.25 MG tablet TAKE 1 TABLET BY MOUTH TWICE  DAILY WITH A MEAL   Cholecalciferol (VITAMIN D3) 25 MCG (1000 UT) CAPS Take by mouth daily.   diclofenac (VOLTAREN) 75 MG EC tablet TAKE 1 TABLET BY MOUTH TWICE  DAILY   ENTRESTO 49-51 MG TAKE 1 TABLET BY MOUTH TWICE A DAY   hydrochlorothiazide (HYDRODIURIL) 25 MG tablet Take 1 tablet (25 mg total) by  mouth daily.   omeprazole (PRILOSEC) 20 MG capsule TAKE 1 CAPSULE BY MOUTH DAILY   [DISCONTINUED] losartan (COZAAR) 100 MG tablet    No facility-administered encounter medications on file as of 04/26/2022.    Allergies (verified) Farxiga [dapagliflozin]   History: Past Medical History:  Diagnosis Date   CAD (coronary artery disease)    [place]omy[place]    GERD (gastroesophageal reflux disease)    Hyperlipidemia    Hypertension    Ischemia    Left bundle branch block (LBBB)    Past Surgical History:  Procedure Laterality Date   CERVICAL POLYPECTOMY  2010   FINGER SURGERY  2008   SHOULDER SURGERY Right 2014   TUBAL LIGATION  1988   Family History  Problem Relation Age of Onset   Osteoporosis Mother    Asthma Mother    Hypertension Mother    Hypertension Father    Heart disease Father        MI   Social History   Socioeconomic History   Marital status: Married    Spouse name: Not on file   Number of children: Not on file   Years of education: Not on file   Highest education level: Not on file  Occupational History   Not on file  Tobacco Use   Smoking status: Former    Types: Cigarettes    Quit date: 05/15/1988    Years since  quitting: 33.9   Smokeless tobacco: Never  Vaping Use   Vaping Use: Never used  Substance and Sexual Activity   Alcohol use: Yes    Comment: occasionally - beer   Drug use: No   Sexual activity: Yes    Birth control/protection: Post-menopausal  Other Topics Concern   Not on file  Social History Narrative   Not on file   Social Determinants of Health   Financial Resource Strain: Low Risk  (04/26/2022)   Overall Financial Resource Strain (CARDIA)    Difficulty of Paying Living Expenses: Not hard at all  Food Insecurity: No Food Insecurity (04/26/2022)   Hunger Vital Sign    Worried About Running Out of Food in the Last Year: Never true    Ran Out of Food in the Last Year: Never true  Transportation Needs: No Transportation  Needs (04/26/2022)   PRAPARE - Hydrologist (Medical): No    Lack of Transportation (Non-Medical): No  Physical Activity: Inactive (04/26/2022)   Exercise Vital Sign    Days of Exercise per Week: 0 days    Minutes of Exercise per Session: 0 min  Stress: No Stress Concern Present (04/26/2022)   [place]    Feeling of Stress : Not at all  Social Connections: [place] (04/26/2022)   Social Connection and Isolation Panel [NHANES]    Frequency of Communication with Friends and Family: More than three times a week    Frequency of Social Gatherings with Friends and Family: Three times a week    Attends Religious Services: More than 4 times per year    Active Member of Clubs or Organizations: Yes    Attends Music therapist: More than 4 times per year    Marital Status: Married    Tobacco Counseling Counseling given: Not Answered   Clinical Intake:  Pre-visit preparation completed: Yes  Pain : No/denies pain  BMI - recorded: 28.62 Nutritional Status: BMI 25 -29 Overweight Nutritional Risks: None Diabetes: No  How often do you need to have someone help you when you read instructions, pamphlets, or other written materials from your doctor or pharmacy?: 1 - Never  Diabetic?No   Interpreter Needed?: No  Information entered by :: Denman George LPN   Activities of Daily Living    04/26/2022    1:51 PM  In your present state of health, do you have any difficulty performing the following activities:  Hearing? 0  Vision? 0  Difficulty concentrating or making decisions? 0  Walking or climbing stairs? 0  Dressing or bathing? 0  Doing errands, shopping? 0  Preparing Food and eating ? N  Using the Toilet? N  In the past six months, have you accidently leaked urine? N  Do you have problems with loss of bowel control? N  Managing your Medications? N  Managing  your Finances? N  Housekeeping or managing your Housekeeping? N    Patient Care Team: Sharion Balloon, FNP as PCP - General (Family Medicine) Debara Pickett Nadean Corwin, MD as PCP - Cardiology (Cardiology) [facility], [facility], [place] as Consulting Physician (Obstetrics and Gynecology) [facility] Doctor as Consulting Physician  Indicate any recent Medical Services you may have received from other than [facility] providers in the past year (date may be approximate).     Assessment:   This is a routine wellness examination for [place].  Hearing/Vision screen Hearing Screening - Comments:: No  concerns noted  Vision Screening - Comments:: Wears rx glasses - up to date with routine eye exams with [facility]      Dietary issues and exercise activities discussed: Current Exercise Habits: Home exercise routine, Time (Minutes): 30, Frequency (Times/Week): 3, Weekly Exercise (Minutes/Week): 90, Intensity: Mild   Goals Addressed             This Visit's Progress    Exercise 150 min/wk Moderate Activity   Not on track      Depression Screen    04/26/2022    1:45 PM 01/05/2021    8:39 AM 08/09/2020    8:54 AM 07/08/2020    8:48 AM 10/06/2019   12:18 PM 02/13/2019    2:29 PM 01/09/2019    9:07 AM  PHQ 2/9 Scores  PHQ - 2 Score 0 0 0 0 0 0 0    Fall Risk    04/26/2022    1:44 PM 06/20/2021   10:48 AM 01/05/2021    8:39 AM 08/09/2020    8:54 AM 10/06/2019   12:18 PM  Fall Risk   Falls in the past year? 0 1 0 0 0  Number falls in past yr: 0 0     Injury with Fall? 0 0     Risk for fall due to : No Fall Risks History of fall(s)     Follow up Falls evaluation completed;Education provided;Falls prevention discussed Education provided       FALL RISK PREVENTION PERTAINING TO THE HOME:  Any stairs in or around the home? Yes  If so, are there any without handrails? No  Home free of loose throw rugs in walkways, pet beds, electrical cords, etc? Yes  Adequate lighting  in your home to reduce risk of falls? Yes   ASSISTIVE DEVICES UTILIZED TO PREVENT FALLS:  Life alert? No  Use of a cane, walker or w/c? No  Grab bars in the bathroom? Yes  Shower chair or bench in shower? No  Elevated toilet seat or a handicapped toilet? Yes   TIMED UP AND GO:  Was the test performed? No .  Length of time to ambulate 10 feet: telephonic visit     Cognitive Function:    02/13/2019    2:48 PM  MMSE - Mini Mental State Exam  Orientation to time 5  Orientation to Place 5  Registration 3  Attention/ Calculation 5  Recall 3  Language- name 2 objects 2  Language- repeat 1  Language- follow 3 step command 3  Language- read & follow direction 1  Write a sentence 1  Copy design 1  Total score 30        04/26/2022    1:52 PM 08/09/2020    8:59 AM  6CIT Screen  What Year? 0 points 0 points  What month? 0 points 0 points  What time? 0 points 0 points  Count back from 20 0 points 0 points  Months in reverse 0 points 0 points  Repeat phrase 0 points 0 points  Total Score 0 points 0 points    Immunizations Immunization History  Administered Date(s) Administered   Fluad Quad(high Dose 65+) 02/13/2019, 02/03/2022   Influenza Inj Mdck Quad With Preservative 02/25/2018   Influenza,inj,Quad PF,6+ Mos 01/24/2015, 04/03/2016   Influenza,inj,quad, With Preservative 02/24/2017   Influenza-Unspecified 03/23/2014, 02/25/2018   Janssen (J&J) SARS-COV-2 Vaccination 09/15/2019   Pneumococcal Conjugate-13 01/09/2019   Pneumococcal Polysaccharide-23 07/08/2020   Pneumococcal-Unspecified 07/07/2020   Td 07/25/2017   Tdap  07/25/2017   Unspecified SARS-COV-2 Vaccination 09/15/2019   Zoster, Live 03/01/2013, 03/27/2013    TDAP status: Up to date  Flu Vaccine status: Up to date  Pneumococcal vaccine status: Up to date  Covid-19 vaccine status: Information provided on how to obtain vaccines.   Qualifies for Shingles Vaccine? Yes   Zostavax completed No   Shingrix  Completed?: Yes  Screening Tests Health Maintenance  Topic Date Due   Fecal DNA (Cologuard)  Never done   DEXA SCAN  10/03/2021   COVID-19 Vaccine (3 - 2023-24 season) 01/13/2022   Zoster Vaccines- Shingrix (1 of 2) 05/05/2022 (Originally 07/04/2003)   MAMMOGRAM  01/21/2023   Medicare Annual Wellness (AWV)  04/27/2023   DTaP/Tdap/Td (3 - Td or Tdap) 07/26/2027   Pneumonia Vaccine 70+ Years old  Completed   INFLUENZA VACCINE  Completed   Hepatitis C Screening  Completed   HPV VACCINES  Aged Out   COLONOSCOPY (Pts 45-79yr Insurance coverage will need to be confirmed)  [place] Due  Topic Date Due   Fecal DNA (Cologuard)  Never done   DEXA SCAN  10/03/2021   COVID-19 Vaccine (3 - 2023-24 season) 01/13/2022    Colorectal cancer screening: Referral to GI placed for Cologuard test (patient has received kit and plans to complete). Pt aware the office will call re: appt.  Mammogram status: Completed 01/20/21. Repeat every year (requesting last results from Gynecologist)   Bone Density status: Completed 10/03/16. Results reflect: Bone density results: NORMAL. Repeat every 5 years. (Requesting last results from Gynecologist)   Lung Cancer Screening: (Low Dose CT Chest recommended if Age 68-80years, 30 pack-year currently smoking OR have quit w/in 15years.) does not qualify.   Lung Cancer Screening Referral: n/a  Additional Screening:  Hepatitis C Screening: does qualify; Completed 07/25/17  Vision Screening: Recommended annual ophthalmology exams for early detection of glaucoma and other disorders of the eye. Is the patient up to date with their annual eye exam?  Yes  Who is the provider or what is the name of the office in which the patient attends annual eye exams? [place] If pt is not established with a provider, would they like to be referred to a provider to establish care? No .   Dental Screening: Recommended annual dental  exams for proper oral hygiene  Community Resource Referral / Chronic Care Management: CRR required this visit?  No   CCM required this visit?  No      Plan:     I have personally reviewed and noted the following in the patient's chart:   Medical and social history Use of alcohol, tobacco or illicit drugs  Current medications and supplements including opioid prescriptions. Patient is not currently taking opioid prescriptions. Functional ability and status Nutritional status Physical activity Advanced directives List of other physicians Hospitalizations, surgeries, and ER visits in previous 12 months Vitals Screenings to include cognitive, depression, and falls Referrals and appointments  In addition, I have reviewed and discussed with patient certain preventive protocols, quality metrics, and best practice recommendations. A written personalized care plan for preventive services as well as general preventive health recommendations were provided to patient.     SVanetta Mulders [place]  157/32/2025  Due to this being a virtual visit, the after visit summary with patients personalized plan was offered to patient via mail or my-chart.Patient would like to access on my-chart  Nurse Notes: No concerns

## 2022-04-28 ENCOUNTER — Telehealth: Payer: Self-pay | Admitting: Family Medicine

## 2022-04-28 DIAGNOSIS — I10 Essential (primary) hypertension: Secondary | ICD-10-CM

## 2022-04-28 NOTE — Telephone Encounter (Signed)
-----   Message from Clinton Sawyer sent at 04/24/2022  3:53 PM EST ----- Please Review [facility] Quality team note. Thank you

## 2022-05-04 ENCOUNTER — Encounter: Payer: Self-pay | Admitting: Internal Medicine

## 2022-05-04 ENCOUNTER — Ambulatory Visit: Payer: Medicare Other | Attending: Internal Medicine | Admitting: Internal Medicine

## 2022-05-04 VITALS — BP 136/83 | HR 69 | Ht 64.0 in | Wt 175.2 lb

## 2022-05-04 DIAGNOSIS — E785 Hyperlipidemia, unspecified: Secondary | ICD-10-CM | POA: Diagnosis not present

## 2022-05-04 DIAGNOSIS — I5022 Chronic systolic (congestive) heart failure: Secondary | ICD-10-CM

## 2022-05-04 DIAGNOSIS — I447 Left bundle-branch block, unspecified: Secondary | ICD-10-CM | POA: Diagnosis not present

## 2022-05-04 DIAGNOSIS — I10 Essential (primary) hypertension: Secondary | ICD-10-CM | POA: Diagnosis not present

## 2022-05-04 NOTE — Progress Notes (Addendum)
OFFICE NOTE  Chief Complaint:  No complaints  Primary Care Physician: Veronica Balloon, FNP  HPI:  Veronica Gomez is a pleasant 66 year old female who moved to [place] in January of this year. Her past echo history is significant for hypertension, dyslipidemia and a former smoker, but quit over 25 years ago. 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 In 2009 she was noted to have an interventricular conduction delay, and was referred for stress test which was negative for ischemia. Records indicate 2013 she was having some chest discomfort and had a new, clear left bundle branch block. She underwent a nuclear stress test shows a fixed inferoapical defect. EF was 56%. An echocardiogram performed around the same time showed an EF of 40% with apical inferior wall hypokinesis. This was determined to be scar, no further workup was recommended as her symptoms were not thought to be due to ischemia. No ischemia was noted on her stress test. She is on appropriate medical therapy including aspirin, statin, ACE inhibitor, beta blocker and diuretic. She is quite active around her house and into your and denies any chest pain or worsening shortness of breath. Her weight has been stable. Her last office visit with her cardiologist was in December 2014 no changes to her medical regimen were recommended at that time. She did have a recent lipid profile which showed total cholesterol 162 and LDL of 99.  I saw Veronica Gomez back in the office today. She has no new complaints. We recently did lipid testing which shows her LDL cholesterol to be 99. That is still higher than her goal LDL of less than 70. We talked about possibly adjusting her Lipitor up however she reported she had side effects with that in the past.  Veronica Gomez returns today for follow-up. Overall she is feeling well. She denies any worsening shortness of breath or chest pain. Blood pressure has been very well  controlled. Recently she had discussed medicine changes which were planning based on her cholesterol not being quite at goal. She was concerned about myalgias on higher dose statin and therefore I recommended adding Avandia. Alternatively her primary care provider suggested she could take a double dose of Lipitor every other day. She's been doing that for at least several months and is due for recheck of her cholesterol. She currently denies any myalgias or myopathy  I had the pleasure seeing Veronica Gomez back today in the office. Slightly earlier than our normal follow-up appointment. She's changed insurance companies and is interested in starting some more exercise. She denies any chest pain but does get short of breath with activity. She was due for repeat echocardiogram which I like to get to see if she's had any change in her LV function. She is currently on aspirin, Lipitor, carvedilol and lisinopril.  08/25/2016  Veronica Gomez returns today for follow-up. Overall she is doing well. She denies any worsening shortness of breath or chest pain. She has NYHA class I symptoms. Based on this she does not qualify for Entresto. She is currently on carvedilol and lisinopril although but has been having recent cough. I think this is allergies but her PCP did write to switch her from lisinopril over to losartan. She reports some rhinitis symptoms as well as itchy eyes and no significant pollen burden now. I suggested she may benefit from using a nasal steroid and saline nasal spray. She's currently using cetirizine and diphenhydramine. She should avoid  any decongestants such as pseudoephedrine.  12/01/2019  Veronica Gomez is seen today in follow-up.  I last saw her via virtual visit during the pandemic about a year ago.  Since then she has done well.  She denies any worsening shortness of breath or chest pain.  She reports her weight has been up or down but is a few pounds lower than it was before.  She is overdue for a  repeat lipid profile.  I previously increased her atorvastatin up to 40 mg to try to reach a target LDL less than 70.  Also her last echocardiogram was in 2017 again showing an ischemic cardiomyopathy with EF of 40%.  She continues to have NYHA class I symptoms.  01/28/2021  Veronica Gomez returns today for follow-up.  LVEF has remained low at 40 to 45% with some regional wall motion abnormalities however compared to to a prior study in 2017 is stable.  She is on historic heart failure medications but is not on any of the newer guideline directed medical therapies.  We discussed the possibility of switching her ARB to [facility] today and the fact that it might be helpful with improving her LV function although I do believe it is possibly related to bundle branch block.  11/07/2021  Veronica Gomez is seen today in follow-up.  Overall she seems to be doing well.  She had seen Veronica Rough, PA-C in follow-up in January.  Her Veronica Gomez has been further titrated.  Then she was seen by our pharmacist and started on Jardiance as she was unable to tolerate [place].  She seems to be doing well on the Jardiance.  Blood pressures at home have been solidly well controlled in the 120s over 80s.  She has no more than NYHA class I symptoms.  05/04/2022  Veronica Gomez is seen today in follow-up.  Overall she seems to be doing pretty well.  No chest pain or shortness of breath was noted.  She is on stable heart failure therapy.  She could not tolerate the SGLT2 inhibitors.  This was related to urinary tract infections.  She is on Entresto and carvedilol.  Her LVEF is stayed fairly stable on echo at 43%.  She has NYHA class I symptoms.  EKG today shows sinus rhythm with a left bundle branch block.  PMHx:  Past Medical History:  Diagnosis Date   CAD (coronary artery disease)    Cardiomyopathy ([place])    GERD (gastroesophageal reflux disease)    Hyperlipidemia    Hypertension    Ischemia    Left bundle branch block (LBBB)      Past Surgical History:  Procedure Laterality Date   CERVICAL POLYPECTOMY  2010   FINGER SURGERY  2008   SHOULDER SURGERY Right 2014   TUBAL LIGATION  1988    FAMHx:  Family History  Problem Relation Age of Onset   Osteoporosis Mother    Asthma Mother    Hypertension Mother    Hypertension Father    Heart disease Father        MI    SOCHx:   reports that she quit smoking about 33 years ago. Her smoking use included cigarettes. She has never used smokeless tobacco. She reports current alcohol use. She reports that she does not use drugs.  ALLERGIES:  Allergies  Allergen Reactions   Farxiga [Dapagliflozin]     myalgias    ROS: Pertinent items noted in HPI and remainder of comprehensive ROS otherwise negative.  HOME MEDS: Current  Outpatient Medications  Medication Sig Dispense Refill   Ascorbic Acid (VITAMIN C PO) Vitamin C Chewable 500 mg tablet   1 tablet every day by oral route.     aspirin 81 MG tablet Take 81 mg by mouth daily.     atorvastatin (LIPITOR) 40 MG tablet TAKE 1 TABLET BY MOUTH DAILY 100 tablet 0   baclofen (LIORESAL) 10 MG tablet TAKE 1 TABLET BY MOUTH THREE TIMES A DAY 270 tablet 0   carvedilol (COREG) 6.25 MG tablet TAKE 1 TABLET BY MOUTH TWICE  DAILY WITH A MEAL 200 tablet 0   Cholecalciferol (VITAMIN D3) 25 MCG (1000 UT) CAPS Take by mouth daily.     diclofenac (VOLTAREN) 75 MG EC tablet TAKE 1 TABLET BY MOUTH TWICE  DAILY 200 tablet 0   ENTRESTO 49-51 MG TAKE 1 TABLET BY MOUTH TWICE A DAY 180 tablet 3   hydrochlorothiazide (HYDRODIURIL) 25 MG tablet Take 1 tablet (25 mg total) by mouth daily. 90 tablet 1   omeprazole (PRILOSEC) 20 MG capsule TAKE 1 CAPSULE BY MOUTH DAILY 100 capsule 2   No current facility-administered medications for this visit.    LABS/IMAGING: No results found for this or any previous visit (from the past 48 hour(s)). No results found.  VITALS: BP 136/83   Pulse 69   Ht 5\' 4"  (1.626 m)   Wt 175 lb 3.2 oz (79.5 kg)    LMP 09/13/2008 (Approximate)   SpO2 98%   BMI 30.07 kg/m   EXAM: General appearance: alert and no distress Neck: no carotid bruit and no JVD Lungs: clear to auscultation bilaterally Heart: regular rate and rhythm, S1, S2 normal, no murmur, click, rub or gallop Abdomen: soft, non-tender; bowel sounds normal; no masses,  no organomegaly Extremities: extremities normal, atraumatic, no cyanosis or edema Pulses: 2+ and symmetric Skin: Skin color, texture, turgor normal. No rashes or lesions Neurologic: Grossly normal Psych: Mood, affect normal  EKG: Normal sinus rhythm at 69, LBBB, QRSD 152 ms -personally reviewed  ASSESSMENT: Ischemic cardiomyopathy, EF between 40 and 50% Fixed inferoapical defect suggestive of scar on prior nuclear stress testing Chronic LBBB Hypertension Dyslipidemia Former smoker  PLAN: 1.   Veronica Gomez remains basically asymptomatic with NYHA class I symptoms with EF at about 43%.  This has not significantly improved despite medical therapies.  Unfortunately she could not tolerate SGLT2 inhibitors.  We talked about adding Aldactone although there is a little additional benefit.  She is not interested in changing meds at this time.  I am curious as to why her LVEF remains low.  It may be related to bundle branch block or possibly prior scar.  She was told she had a fixed defect on a Myoview in the past which could have been a bundle branch block.  We discussed the possibility of getting a cardiac MRI to make sure there is no infiltrative disease or to evaluate for scar and she was interested in that.  Will go ahead and pursue that.  We may make further medical changes based on it.  Follow-up with me afterwards.  Pixie Casino, MD, [facility], [place] Director of the Advanced Lipid Disorders &  Cardiovascular Risk Reduction Clinic Diplomate of the American Board of Clinical Lipidology Attending Cardiologist  Direct Dial:  [phone]  Fax: [phone]  Website:  [email or web address] Fergusson 05/04/2022, 9:29 AM

## 2022-05-04 NOTE — Patient Instructions (Signed)
Medication Instructions:  NO CHANGES  *If you need a refill on your cardiac medications before your next appointment, please call your pharmacy*   Lab Work: Non-Fasting CBC prior to Cardiac MRI  If you have labs (blood work) drawn today and your tests are completely normal, you will receive your results only by: MyChart Message (if you have MyChart) OR A paper copy in the mail If you have any lab test that is abnormal or we need to change your treatment, we will call you to review the results.   Testing/Procedures:    Follow-Up: At [facility], you and your health needs are our priority.  As part of our continuing mission to provide you with exceptional heart care, we have created designated Provider Care Teams.  These Care Teams include your primary Cardiologist (physician) and Advanced Practice Providers (APPs -  Physician Assistants and Nurse Practitioners) who all work together to provide you with the care you need, when you need it.  We recommend signing up for the patient portal called "MyChart".  Sign up information is provided on this After Visit Summary.  MyChart is used to connect with patients for Virtual Visits (Telemedicine).  Patients are able to view lab/test results, encounter notes, upcoming appointments, etc.  Non-urgent messages can be sent to your provider as well.   To learn more about what you can do with MyChart, go to [email or web address].    Your next appointment:    6-8 weeks with Dr. Rennis Golden -- after testing        You are scheduled for Cardiac MRI on ______________. Please arrive for your appointment at ______________ ( arrive 30-45 minutes prior to test start time). ?  [facility] [address] [place], [place] [address] [phone] Please take advantage of the free valet parking available at the MAIN entrance (A entrance).  Proceed to the [facility] Radiology Department (First Floor) for check-in.   OR   [facility] [address] [place], [place] [address] [phone] Please take advantage of the free valet parking available at the MAIN entrance. Proceed to [facility] registration for check-in (first floor).  Magnetic resonance imaging (MRI) is a painless test that produces images of the inside of the body without using Xrays.  During an MRI, strong magnets and radio waves work together in a Data processing manager to form detailed images.   MRI images may provide more details about a medical condition than X-rays, CT scans, and ultrasounds can provide.  You may be given earphones to listen for instructions.  You may eat a light breakfast and take medications as ordered with the exception of HCTZ (fluid pill, other). Please avoid stimulants for 12 hr prior to test. (Ie. Caffeine, nicotine, chocolate, or antihistamine medications)  If a contrast material will be used, an IV will be inserted into one of your veins. Contrast material will be injected into your IV. It will leave your body through your urine within a day. You may be told to drink plenty of fluids to help flush the contrast material out of your system.  You will be asked to remove all metal, including: Watch, jewelry, and other metal objects including hearing aids, hair pieces and dentures. Also wearable glucose monitoring systems (ie. Freestyle Libre and Omnipods) (Braces and fillings normally are not a problem.)   TEST WILL TAKE APPROXIMATELY 1 HOUR  PLEASE NOTIFY SCHEDULING AT LEAST 24 HOURS IN ADVANCE IF YOU ARE UNABLE TO KEEP YOUR APPOINTMENT.  [phone]  Please call Rockwell Alexandria, cardiac imaging nurse navigator with any questions/concerns. Rockwell Alexandria RN Navigator Cardiac Imaging Larey Brick RN Navigator Cardiac Imaging [facility] [phone] Office

## 2022-05-31 ENCOUNTER — Encounter: Payer: Self-pay | Admitting: Family

## 2022-06-04 ENCOUNTER — Other Ambulatory Visit: Payer: Self-pay | Admitting: Family

## 2022-06-04 DIAGNOSIS — E785 Hyperlipidemia, unspecified: Secondary | ICD-10-CM

## 2022-06-04 DIAGNOSIS — G8929 Other chronic pain: Secondary | ICD-10-CM

## 2022-06-04 DIAGNOSIS — I10 Essential (primary) hypertension: Secondary | ICD-10-CM

## 2022-06-04 DIAGNOSIS — M1712 Unilateral primary osteoarthritis, left knee: Secondary | ICD-10-CM

## 2022-06-05 NOTE — Telephone Encounter (Signed)
[facility] NTBS in March for 6 mos FU. Refill sent to mail order pharmacy

## 2022-06-05 NOTE — Telephone Encounter (Signed)
Pt made appt for 06/27/2022  Please send refill to pharm

## 2022-06-08 ENCOUNTER — Encounter: Payer: Self-pay | Admitting: Family

## 2022-06-27 ENCOUNTER — Encounter: Payer: Self-pay | Admitting: Family

## 2022-06-27 ENCOUNTER — Ambulatory Visit (INDEPENDENT_AMBULATORY_CARE_PROVIDER_SITE_OTHER): Payer: Medicare Other | Admitting: Family

## 2022-06-27 VITALS — BP 122/80 | HR 74 | Temp 97.3°F | Ht 64.0 in | Wt 173.8 lb

## 2022-06-27 DIAGNOSIS — M545 Low back pain, unspecified: Secondary | ICD-10-CM | POA: Diagnosis not present

## 2022-06-27 DIAGNOSIS — Z23 Encounter for immunization: Secondary | ICD-10-CM

## 2022-06-27 DIAGNOSIS — K219 Gastro-esophageal reflux disease without esophagitis: Secondary | ICD-10-CM | POA: Diagnosis not present

## 2022-06-27 DIAGNOSIS — E785 Hyperlipidemia, unspecified: Secondary | ICD-10-CM

## 2022-06-27 DIAGNOSIS — G8929 Other chronic pain: Secondary | ICD-10-CM | POA: Diagnosis not present

## 2022-06-27 DIAGNOSIS — I447 Left bundle-branch block, unspecified: Secondary | ICD-10-CM

## 2022-06-27 DIAGNOSIS — I255 Ischemic cardiomyopathy: Secondary | ICD-10-CM | POA: Diagnosis not present

## 2022-06-27 DIAGNOSIS — E669 Obesity, unspecified: Secondary | ICD-10-CM

## 2022-06-27 DIAGNOSIS — I10 Essential (primary) hypertension: Secondary | ICD-10-CM

## 2022-06-27 NOTE — Patient Instructions (Signed)

## 2022-06-27 NOTE — Progress Notes (Signed)
Subjective:    Patient ID: Veronica Gomez, female    DOB: 04-27-1956, 67 y.o.   MRN: [place]:[id number]  Chief Complaint  Patient presents with   Medical Management of Chronic Issues   Pt presents to the office today for chronic follow up. She is followed by Cardiologists every 6 months for HTN and cardiomyopathy.  Hypertension This is a chronic problem. The current episode started more than 1 year ago. The problem has been resolved since onset. The problem is controlled. Pertinent negatives include no malaise/fatigue, peripheral edema or shortness of breath. Risk factors for coronary artery disease include dyslipidemia and sedentary lifestyle. The current treatment provides moderate improvement.  Congestive Heart Failure Presents for follow-up visit. Associated symptoms include fatigue. Pertinent negatives include no muscle weakness or shortness of breath. The symptoms have been stable.  Gastroesophageal Reflux She complains of belching and heartburn. This is a chronic problem. The current episode started more than 1 year ago. The problem occurs occasionally. Associated symptoms include fatigue. Pertinent negatives include no muscle weakness. Risk factors include obesity. She has tried a PPI for the symptoms. The treatment provided moderate relief.  Hyperlipidemia This is a chronic problem. The current episode started more than 1 year ago. The problem is controlled. Recent lipid tests were reviewed and are normal. Exacerbating diseases include obesity. Pertinent negatives include no shortness of breath. Current antihyperlipidemic treatment includes statins. The current treatment provides moderate improvement of lipids. Risk factors for coronary artery disease include dyslipidemia, hypertension, a sedentary lifestyle and post-menopausal.  Back Pain This is a chronic problem. The current episode started more than 1 year ago. The problem occurs intermittently. The problem has been waxing and waning since  onset. The pain is present in the lumbar spine. The quality of the pain is described as aching. The pain is at a severity of 5/10. The pain is moderate. Risk factors include obesity. She has tried home exercises for the symptoms. The treatment provided mild relief.      Review of Systems  Constitutional:  Positive for fatigue. Negative for malaise/fatigue.  Respiratory:  Negative for shortness of breath.   Gastrointestinal:  Positive for heartburn.  Musculoskeletal:  Positive for back pain. Negative for muscle weakness.  All other systems reviewed and are negative.      Objective:   Physical Exam Vitals reviewed.  Constitutional:      General: She is not in acute distress.    Appearance: She is well-developed. She is obese.  HENT:     Head: Normocephalic and atraumatic.     Right Ear: Tympanic membrane normal.     Left Ear: Tympanic membrane normal.  Eyes:     Pupils: Pupils are equal, round, and reactive to light.  Neck:     Thyroid: No thyromegaly.  Cardiovascular:     Rate and Rhythm: Normal rate and regular rhythm.     Heart sounds: Normal heart sounds. No murmur heard. Pulmonary:     Effort: Pulmonary effort is normal. No respiratory distress.     Breath sounds: Normal breath sounds. No wheezing.  Abdominal:     General: Bowel sounds are normal. There is no distension.     Palpations: Abdomen is soft.     Tenderness: There is no abdominal tenderness.  Musculoskeletal:        General: No tenderness. Normal range of motion.     Cervical back: Normal range of motion and neck supple.  Skin:    General: Skin is warm and dry.  Neurological:     Mental Status: She is alert and oriented to person, place, and time.     Cranial Nerves: No cranial nerve deficit.     Deep Tendon Reflexes: Reflexes are normal and symmetric.  Psychiatric:        Behavior: Behavior normal.        Thought Content: Thought content normal.        Judgment: Judgment normal.         BP  122/80   Pulse 74   Temp (!) 97.3 F (36.3 C) (Temporal)   Ht 5' 4"$  (1.626 m)   Wt 173 lb 12.8 oz (78.8 kg)   LMP 09/13/2008 (Approximate)   SpO2 96%   BMI 29.83 kg/m   Assessment & Plan:   Veronica Gomez comes in today with chief complaint of Medical Management of Chronic Issues   Diagnosis and orders addressed:  1. Cardiomyopathy, ischemic - CMP14+EGFR - CBC with Differential/Platelet  2. Chronic bilateral low back pain without sciatica  - CMP14+EGFR - CBC with Differential/Platelet  3. Dyslipidemia - CMP14+EGFR - CBC with Differential/Platelet  4. Gastroesophageal reflux disease, unspecified whether esophagitis present - CMP14+EGFR - CBC with Differential/Platelet  5. Primary hypertension - CMP14+EGFR - CBC with Differential/Platelet  6. Obesity (BMI 30.0-34.9) - CMP14+EGFR - CBC with Differential/Platelet  7. LBBB (left bundle branch block) - CMP14+EGFR - CBC with Differential/Platelet   Labs pending Health Maintenance reviewed Diet and exercise encouraged  Follow up plan: 6 months    [name], FNP

## 2022-06-28 LAB — CMP14+EGFR
ALT: 18 IU/L (ref 0–32)
AST: 15 IU/L (ref 0–40)
Albumin/Globulin Ratio: 1.8 (ref 1.2–2.2)
Albumin: 4.3 g/dL (ref 3.9–4.9)
Alkaline Phosphatase: 57 IU/L (ref 44–121)
BUN/Creatinine Ratio: 19 (ref 12–28)
BUN: 17 mg/dL (ref 8–27)
Bilirubin Total: 0.5 mg/dL (ref 0.0–1.2)
CO2: 23 mmol/L (ref 20–29)
Calcium: 9.3 mg/dL (ref 8.7–10.3)
Chloride: 102 mmol/L (ref 96–106)
Creatinine, Ser: 0.89 mg/dL (ref 0.57–1.00)
Globulin, Total: 2.4 g/dL (ref 1.5–4.5)
Glucose: 101 mg/dL — ABNORMAL HIGH (ref 70–99)
Potassium: 3.9 mmol/L (ref 3.5–5.2)
Sodium: 140 mmol/L (ref 134–144)
Total Protein: 6.7 g/dL (ref 6.0–8.5)
eGFR: 71 mL/min/{1.73_m2} (ref >59)

## 2022-06-28 LAB — CBC WITH DIFFERENTIAL/PLATELET
Basophils Absolute: 0.1 10*3/uL (ref 0.0–0.2)
Basos: 1 %
EOS (ABSOLUTE): 0.2 10*3/uL (ref 0.0–0.4)
Eos: 4 %
Hematocrit: 39.7 % (ref 34.0–46.6)
Hemoglobin: 13.5 g/dL (ref 11.1–15.9)
Immature Grans (Abs): 0 10*3/uL (ref 0.0–0.1)
Immature Granulocytes: 0 %
Lymphocytes Absolute: 1.7 10*3/uL (ref 0.7–3.1)
Lymphs: 34 %
MCH: 32.8 pg (ref 26.6–33.0)
MCHC: 34 g/dL (ref 31.5–35.7)
MCV: 97 fL (ref 79–97)
Monocytes Absolute: 0.3 10*3/uL (ref 0.1–0.9)
Monocytes: 6 %
Neutrophils Absolute: 2.9 10*3/uL (ref 1.4–7.0)
Neutrophils: 55 %
Platelets: 277 10*3/uL (ref 150–450)
RBC: 4.11 x10E6/uL (ref 3.77–5.28)
RDW: 11.9 % (ref 11.7–15.4)
WBC: 5.2 10*3/uL (ref 3.4–10.8)

## 2022-07-17 ENCOUNTER — Other Ambulatory Visit: Payer: Medicare Other

## 2022-07-17 DIAGNOSIS — I447 Left bundle-branch block, unspecified: Secondary | ICD-10-CM | POA: Diagnosis not present

## 2022-07-17 DIAGNOSIS — I5022 Chronic systolic (congestive) heart failure: Secondary | ICD-10-CM | POA: Diagnosis not present

## 2022-07-17 LAB — CBC
Hematocrit: 40.9 % (ref 34.0–46.6)
Hemoglobin: 13.6 g/dL (ref 11.1–15.9)
MCH: 32.3 pg (ref 26.6–33.0)
MCHC: 33.3 g/dL (ref 31.5–35.7)
MCV: 97 fL (ref 79–97)
Platelets: 277 10*3/uL (ref 150–450)
RBC: 4.21 x10E6/uL (ref 3.77–5.28)
RDW: 11.8 % (ref 11.7–15.4)
WBC: 4.5 10*3/uL (ref 3.4–10.8)

## 2022-07-19 ENCOUNTER — Other Ambulatory Visit: Payer: Self-pay | Admitting: Family

## 2022-07-24 ENCOUNTER — Telehealth (HOSPITAL_COMMUNITY): Payer: Self-pay | Admitting: *Deleted

## 2022-07-24 NOTE — Telephone Encounter (Signed)
Reaching out to patient to offer assistance regarding upcoming cardiac imaging study; pt verbalizes understanding of appt date/time, parking situation and where to check in, and verified current allergies; name and call back number provided for further questions should they arise  Gordy Clement RN [facility] [phone] office [phone] cell  Patient denies metal but does report some mild claustrophobia. She thinks she can tolerate the scan.

## 2022-07-25 ENCOUNTER — Ambulatory Visit (HOSPITAL_COMMUNITY)
Admission: RE | Admit: 2022-07-25 | Discharge: 2022-07-25 | Disposition: A | Discharge status: Home / Self Care | Payer: Medicare Other | Source: Ambulatory Visit | Attending: Internal Medicine | Admitting: Internal Medicine

## 2022-07-25 ENCOUNTER — Other Ambulatory Visit: Payer: Self-pay | Admitting: Internal Medicine

## 2022-07-25 DIAGNOSIS — I5022 Chronic systolic (congestive) heart failure: Secondary | ICD-10-CM

## 2022-07-25 DIAGNOSIS — I447 Left bundle-branch block, unspecified: Secondary | ICD-10-CM

## 2022-07-25 MED ORDER — GADOBUTROL 1 MMOL/ML IV SOLN
11.0000 mL | Freq: Once | INTRAVENOUS | Status: AC | PRN
  Administered 2022-07-25: 11 mL via INTRAVENOUS

## 2022-08-13 ENCOUNTER — Other Ambulatory Visit: Payer: Self-pay | Admitting: Family

## 2022-08-13 DIAGNOSIS — E785 Hyperlipidemia, unspecified: Secondary | ICD-10-CM

## 2022-08-13 DIAGNOSIS — M1712 Unilateral primary osteoarthritis, left knee: Secondary | ICD-10-CM

## 2022-08-13 DIAGNOSIS — G8929 Other chronic pain: Secondary | ICD-10-CM

## 2022-08-13 DIAGNOSIS — I10 Essential (primary) hypertension: Secondary | ICD-10-CM

## 2022-09-06 DIAGNOSIS — D2272 Melanocytic nevi of left lower limb, including hip: Secondary | ICD-10-CM | POA: Diagnosis not present

## 2022-09-06 DIAGNOSIS — D2239 Melanocytic nevi of other parts of face: Secondary | ICD-10-CM | POA: Diagnosis not present

## 2022-09-06 DIAGNOSIS — L218 Other seborrheic dermatitis: Secondary | ICD-10-CM | POA: Diagnosis not present

## 2022-09-06 DIAGNOSIS — L814 Other melanin hyperpigmentation: Secondary | ICD-10-CM | POA: Diagnosis not present

## 2022-09-06 DIAGNOSIS — D2271 Melanocytic nevi of right lower limb, including hip: Secondary | ICD-10-CM | POA: Diagnosis not present

## 2022-09-06 DIAGNOSIS — D225 Melanocytic nevi of trunk: Secondary | ICD-10-CM | POA: Diagnosis not present

## 2022-09-06 DIAGNOSIS — L821 Other seborrheic keratosis: Secondary | ICD-10-CM | POA: Diagnosis not present

## 2022-09-13 ENCOUNTER — Encounter: Payer: Self-pay | Admitting: Internal Medicine

## 2022-09-13 ENCOUNTER — Ambulatory Visit: Payer: Medicare Other | Attending: Internal Medicine | Admitting: Internal Medicine

## 2022-09-13 VITALS — BP 138/78 | HR 71 | Ht 65.0 in | Wt 172.6 lb

## 2022-09-13 DIAGNOSIS — I428 Other cardiomyopathies: Secondary | ICD-10-CM | POA: Diagnosis not present

## 2022-09-13 DIAGNOSIS — Z79899 Other long term (current) drug therapy: Secondary | ICD-10-CM | POA: Diagnosis not present

## 2022-09-13 DIAGNOSIS — I10 Essential (primary) hypertension: Secondary | ICD-10-CM | POA: Diagnosis not present

## 2022-09-13 DIAGNOSIS — I5022 Chronic systolic (congestive) heart failure: Secondary | ICD-10-CM

## 2022-09-13 DIAGNOSIS — I447 Left bundle-branch block, unspecified: Secondary | ICD-10-CM | POA: Diagnosis not present

## 2022-09-13 DIAGNOSIS — E785 Hyperlipidemia, unspecified: Secondary | ICD-10-CM | POA: Diagnosis not present

## 2022-09-13 MED ORDER — SACUBITRIL-VALSARTAN 97-103 MG PO TABS: 1.0000 | ORAL_TABLET | Freq: Two times a day (BID) | ORAL | 3 refills | Status: DC

## 2022-09-13 NOTE — Progress Notes (Signed)
OFFICE NOTE  Chief Complaint:  No complaints  Primary Care Physician: Veronica Balloon, FNP  HPI:  Veronica Gomez is a pleasant 67 year old female who moved to [place] in January of this year. Her past echo history is significant for hypertension, dyslipidemia and a former smoker, but quit over 25 years ago. 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 In 2009 she was noted to have an interventricular conduction delay, and was referred for stress test which was negative for ischemia. Records indicate 2013 she was having some chest discomfort and had a new, clear left bundle branch block. She underwent a nuclear stress test shows a fixed inferoapical defect. EF was 56%. An echocardiogram performed around the same time showed an EF of 40% with apical inferior wall hypokinesis. This was determined to be scar, no further workup was recommended as her symptoms were not thought to be due to ischemia. No ischemia was noted on her stress test. She is on appropriate medical therapy including aspirin, statin, ACE inhibitor, beta blocker and diuretic. She is quite active around her house and into your and denies any chest pain or worsening shortness of breath. Her weight has been stable. Her last office visit with her cardiologist was in December 2014 no changes to her medical regimen were recommended at that time. She did have a recent lipid profile which showed total cholesterol 162 and LDL of 99.  I saw Veronica Gomez back in the office today. She has no new complaints. We recently did lipid testing which shows her LDL cholesterol to be 99. That is still higher than her goal LDL of less than 70. We talked about possibly adjusting her Lipitor up however she reported she had side effects with that in the past.  Veronica Gomez returns today for follow-up. Overall she is feeling well. She denies any worsening shortness of breath or chest pain. Blood pressure has been very well  controlled. Recently she had discussed medicine changes which were planning based on her cholesterol not being quite at goal. She was concerned about myalgias on higher dose statin and therefore I recommended adding Avandia. Alternatively her primary care provider suggested she could take a double dose of Lipitor every other day. She's been doing that for at least several months and is due for recheck of her cholesterol. She currently denies any myalgias or myopathy  I had the pleasure seeing [name] back today in the office. Slightly earlier than our normal follow-up appointment. She's changed insurance companies and is interested in starting some more exercise. She denies any chest pain but does get short of breath with activity. She was due for repeat echocardiogram which I like to get to see if she's had any change in her LV function. She is currently on aspirin, Lipitor, carvedilol and lisinopril.  08/25/2016  Veronica Gomez returns today for follow-up. Overall she is doing well. She denies any worsening shortness of breath or chest pain. She has NYHA class I symptoms. Based on this she does not qualify for Entresto. She is currently on carvedilol and lisinopril although but has been having recent cough. I think this is allergies but her PCP did write to switch her from lisinopril over to losartan. She reports some rhinitis symptoms as well as itchy eyes and no significant pollen burden now. I suggested she may benefit from using a nasal steroid and saline nasal spray. She's currently using cetirizine and diphenhydramine. She should avoid  any decongestants such as pseudoephedrine.  12/01/2019  Veronica Gomez is seen today in follow-up.  I last saw her via virtual visit during the pandemic about a year ago.  Since then she has done well.  She denies any worsening shortness of breath or chest pain.  She reports her weight has been up or down but is a few pounds lower than it was before.  She is overdue for a  repeat lipid profile.  I previously increased her atorvastatin up to 40 mg to try to reach a target LDL less than 70.  Also her last echocardiogram was in 2017 again showing an ischemic cardiomyopathy with EF of 40%.  She continues to have NYHA class I symptoms.  01/28/2021  Veronica Gomez returns today for follow-up.  LVEF has remained low at 40 to 45% with some regional wall motion abnormalities however compared to to a prior study in 2017 is stable.  She is on historic heart failure medications but is not on any of the newer guideline directed medical therapies.  We discussed the possibility of switching her ARB to Veronica Gomez today and the fact that it might be helpful with improving her LV function although I do believe it is possibly related to bundle branch block.  11/07/2021  Veronica Gomez is seen today in follow-up.  Overall she seems to be doing well.  She had seen Jari Favre, PA-C in follow-up in January.  Her Sherryll Burger has been further titrated.  Then she was seen by our pharmacist and started on Jardiance as she was unable to tolerate [place].  She seems to be doing well on the Jardiance.  Blood pressures at home have been solidly well controlled in the 120s over 80s.  She has no more than NYHA class I symptoms.  05/04/2022  Veronica Gomez is seen today in follow-up.  Overall she seems to be doing pretty well.  No chest pain or shortness of breath was noted.  She is on stable heart failure therapy.  She could not tolerate the SGLT2 inhibitors.  This was related to urinary tract infections.  She is on Entresto and carvedilol.  Her LVEF is stayed fairly stable on echo at 43%.  She has NYHA class I symptoms.  EKG today shows sinus rhythm with a left bundle branch block.  09/13/2022  Veronica Gomez is seen today in follow-up.  She underwent a cardiac MRI which fortunately showed no prior scar.  EF has been consistently in the low 40s and was 43% on the MRI.  No evidence of gadolinium enhancement.  It was speculated  that there may be cardiomyopathy related to left bundle branch block.  I do think this is the case.  However she continues to endorse no more than NYHA class I symptoms.  Based on that she would not really be a candidate for CRT therapy and her EF is a little too high.  I also considered CCM therapy however this is really for class III or IV heart failure patients.  Blood pressure continues to be a little elevated.  She says it is never really below 120/80.  That may indicate there is room for titrating her Entresto.  PMHx:  Past Medical History:  Diagnosis Date   CAD (coronary artery disease)    Cardiomyopathy (HCC)    GERD (gastroesophageal reflux disease)    Hyperlipidemia    Hypertension    Ischemia    Left bundle branch block (LBBB)     Past Surgical History:  Procedure  Laterality Date   CERVICAL POLYPECTOMY  2010   FINGER SURGERY  2008   SHOULDER SURGERY Right 2014   TUBAL LIGATION  1988    FAMHx:  Family History  Problem Relation Age of Onset   Osteoporosis Mother    Asthma Mother    Hypertension Mother    Hypertension Father    Heart disease Father        MI    SOCHx:   reports that she quit smoking about 34 years ago. Her smoking use included cigarettes. She has never used smokeless tobacco. She reports current alcohol use. She reports that she does not use drugs.  ALLERGIES:  Allergies  Allergen Reactions   Farxiga [Dapagliflozin]     myalgias   Jardiance [Empagliflozin]     Yeast     ROS: Pertinent items noted in HPI and remainder of comprehensive ROS otherwise negative.  HOME MEDS: Current Outpatient Medications  Medication Sig Dispense Refill   Ascorbic Acid (VITAMIN C PO) Vitamin C Chewable 500 mg tablet   1 tablet every day by oral route.     aspirin 81 MG tablet Take 81 mg by mouth daily.     atorvastatin (LIPITOR) 40 MG tablet TAKE 1 TABLET BY MOUTH DAILY 100 tablet 0   baclofen (LIORESAL) 10 MG tablet TAKE 1 TABLET BY MOUTH THREE TIMES A DAY  270 tablet 0   carvedilol (COREG) 6.25 MG tablet TAKE 1 TABLET BY MOUTH TWICE  DAILY WITH A MEAL 200 tablet 0   Cholecalciferol (VITAMIN D3) 25 MCG (1000 UT) CAPS Take by mouth daily.     diclofenac (VOLTAREN) 75 MG EC tablet TAKE 1 TABLET BY MOUTH TWICE  DAILY 200 tablet 0   ENTRESTO 49-51 MG TAKE 1 TABLET BY MOUTH TWICE A DAY 180 tablet 3   hydrochlorothiazide (HYDRODIURIL) 25 MG tablet TAKE 1 TABLET (25 MG TOTAL) BY MOUTH DAILY. 90 tablet 1   Melatonin-Pyridoxine (MELATIN PO) Take by mouth.     omeprazole (PRILOSEC) 20 MG capsule TAKE 1 CAPSULE BY MOUTH DAILY 100 capsule 2   No current facility-administered medications for this visit.    LABS/IMAGING: No results found for this or any previous visit (from the past 48 hour(s)). No results found.  VITALS: BP 138/78 (BP Location: Right Arm, Patient Position: Sitting, Cuff Size: Normal)   Pulse 71   Ht 5\' 5"  (1.651 m)   Wt 172 lb 9.6 oz (78.3 kg)   LMP 09/13/2008 (Approximate)   SpO2 99%   BMI 28.72 kg/m   EXAM: General appearance: alert and no distress Neck: no carotid bruit and no JVD Lungs: clear to auscultation bilaterally Heart: regular rate and rhythm, S1, S2 normal, no murmur, click, rub or gallop Abdomen: soft, non-tender; bowel sounds normal; no masses,  no organomegaly Extremities: extremities normal, atraumatic, no cyanosis or edema Pulses: 2+ and symmetric Skin: Skin color, texture, turgor normal. No rashes or lesions Neurologic: Grossly normal Psych: Mood, affect normal  EKG: Deferred  ASSESSMENT: Non-ischemic cardiomyopathy, LVEF 43% by cMRI - no scar or infiltrative process (07/2022) Fixed inferoapical defect suggestive of scar on prior nuclear stress testing Chronic LBBB Hypertension Dyslipidemia Former smoker  PLAN: 1.   Mrs. Zabawa underwent cardiac MRI which showed EF of 43% but no evidence of scar or infiltrative process.  She had a previous fixed defect on Myoview testing thought to be a scar however  this was likely a left bundle branch block related artifact.  I suspect she has left bundle  branch related cardiomyopathy.  As there is still some room and blood pressure I like to uptitrate her to the max dose of Entresto.  She cannot tolerate SGLT2 inhibitors due to infection and is on aspirin, statin and carvedilol.  She still endorses only NYHA class I symptoms.  Will continue with this change in therapy and reassess LV function by echocardiography in 6 to 12 months.  Chrystie Nose, MD, [facility], FACP  [place] [place]  [facility]  Medical Director of the [facility] Lipid Disorders &  Cardiovascular Risk Reduction Clinic Diplomate of the American Board of Clinical Lipidology Attending Cardiologist  Direct Dial: [phone]  Fax: [phone]  Website:  [email or web address]   Lisette Abu Ijanae Macapagal 09/13/2022, 2:12 PM

## 2022-09-13 NOTE — Patient Instructions (Signed)
Medication Instructions:  INCREASE entresto to 97/103mg  twice daily OK to finish out your current prescription     Lab Work: Non-Fasting BMET -- complete 2 weeks after you increase the entresto dose    Follow-Up: At [facility], you and your health needs are our priority.  As part of our continuing mission to provide you with exceptional heart care, we have created designated Provider Care Teams.  These Care Teams include your primary Cardiologist (physician) and Advanced Practice Providers (APPs -  Physician Assistants and Nurse Practitioners) who all work together to provide you with the care you need, when you need it.  We recommend signing up for the patient portal called "MyChart".  Sign up information is provided on this After Visit Summary.  MyChart is used to connect with patients for Virtual Visits (Telemedicine).  Patients are able to view lab/test results, encounter notes, upcoming appointments, etc.  Non-urgent messages can be sent to your provider as well.   To learn more about what you can do with MyChart, go to [email or web address].    Your next appointment:    6 months with Dr. Rennis Golden

## 2022-10-20 ENCOUNTER — Encounter: Payer: Self-pay | Admitting: Family

## 2022-10-20 ENCOUNTER — Ambulatory Visit (INDEPENDENT_AMBULATORY_CARE_PROVIDER_SITE_OTHER): Payer: Medicare Other | Admitting: Family

## 2022-10-20 VITALS — BP 123/82 | HR 74 | Temp 97.9°F | Ht 65.0 in | Wt 174.6 lb

## 2022-10-20 DIAGNOSIS — B351 Tinea unguium: Secondary | ICD-10-CM | POA: Diagnosis not present

## 2022-10-20 DIAGNOSIS — M79669 Pain in unspecified lower leg: Secondary | ICD-10-CM | POA: Diagnosis not present

## 2022-10-20 DIAGNOSIS — M79662 Pain in left lower leg: Secondary | ICD-10-CM | POA: Diagnosis not present

## 2022-10-20 MED ORDER — TERBINAFINE HCL 250 MG PO TABS
250.0000 mg | ORAL_TABLET | Freq: Every day | ORAL | 0 refills | Status: DC
Start: 2022-10-20 — End: 2024-03-31

## 2022-10-20 MED ORDER — DICLOFENAC SODIUM 1 % EX GEL
2.0000 g | Freq: Four times a day (QID) | CUTANEOUS | 1 refills | Status: DC
Start: 2022-10-20 — End: 2024-03-31

## 2022-10-20 NOTE — Patient Instructions (Signed)

## 2022-10-20 NOTE — Progress Notes (Signed)
   Subjective:    Patient ID: Veronica Gomez, female    DOB: 05/13/1956, 67 y.o.   MRN: [id number]  Chief Complaint  Patient presents with   cramps in legs     At night both toes discolor when bends them they are sore     HPI PT presents to the office today with left lower shin pain that is dull that comes and goes. Worse at night. She also reports when she bends her toes she has soreness and has noticed discoloration of bilateral toenail and discoloration.    Review of Systems  All other systems reviewed and are negative.      Objective:   Physical Exam Vitals reviewed.  Constitutional:      General: She is not in acute distress.    Appearance: She is well-developed.  HENT:     Head: Normocephalic and atraumatic.  Eyes:     Pupils: Pupils are equal, round, and reactive to light.  Neck:     Thyroid: No thyromegaly.  Cardiovascular:     Rate and Rhythm: Normal rate and regular rhythm.     Heart sounds: Normal heart sounds. No murmur heard. Pulmonary:     Effort: Pulmonary effort is normal. No respiratory distress.     Breath sounds: Normal breath sounds. No wheezing.  Abdominal:     General: Bowel sounds are normal. There is no distension.     Palpations: Abdomen is soft.     Tenderness: There is no abdominal tenderness.  Musculoskeletal:        General: No tenderness. Normal range of motion.     Cervical back: Normal range of motion and neck supple.     Comments: Full ROM of leg, no increased pain with shin with flexion or extension  Skin:    General: Skin is warm and dry.     Comments: Bilateral toenails thick and discolored   Neurological:     Mental Status: She is alert and oriented to person, place, and time.     Cranial Nerves: No cranial nerve deficit.     Deep Tendon Reflexes: Reflexes are normal and symmetric.  Psychiatric:        Behavior: Behavior normal.        Thought Content: Thought content normal.        Judgment: Judgment normal.      BP (!)  145/89   Pulse 74   Temp 97.9 F (36.6 C) (Temporal)   Ht 5\' 5"  (1.651 m)   Wt 174 lb 9.6 oz (79.2 kg)   LMP 09/13/2008 (Approximate)   SpO2 98%   BMI 29.05 kg/m       Assessment & Plan:  Veronica Gomez comes in today with chief complaint of cramps in legs  (At night both toes discolor when bends them they are sore )   Diagnosis and orders addressed:  1. Pain in shin, unspecified laterality Continue baclofen as needed  Continue Diclofenac BID with food No other NSAID's  - diclofenac Sodium (VOLTAREN) 1 % GEL; Apply 2 g topically 4 (four) times daily.  Dispense: 350 g; Refill: 1 - CMP14+EGFR  2. Onychomycosis Start terbinafine  Keep clean and dry Avoid walking barefood - terbinafine (LAMISIL) 250 MG tablet; Take 1 tablet (250 mg total) by mouth daily.  Dispense: 90 tablet; Refill: 0 - CMP14+EGFR   Labs pending Health Maintenance reviewed Diet and exercise encouraged  Follow up plan: Keep chronic follow up  Jannifer Rodney, FNP

## 2022-10-21 LAB — CMP14+EGFR
ALT: 17 IU/L (ref 0–32)
AST: 15 IU/L (ref 0–40)
Albumin/Globulin Ratio: 2 (ref 1.2–2.2)
Albumin: 4.5 g/dL (ref 3.9–4.9)
Alkaline Phosphatase: 67 IU/L (ref 44–121)
BUN/Creatinine Ratio: 21 (ref 12–28)
BUN: 22 mg/dL (ref 8–27)
Bilirubin Total: 0.4 mg/dL (ref 0.0–1.2)
CO2: 28 mmol/L (ref 20–29)
Calcium: 9.8 mg/dL (ref 8.7–10.3)
Chloride: 102 mmol/L (ref 96–106)
Creatinine, Ser: 1.06 mg/dL — ABNORMAL HIGH (ref 0.57–1.00)
Globulin, Total: 2.3 g/dL (ref 1.5–4.5)
Glucose: 92 mg/dL (ref 70–99)
Potassium: 4.1 mmol/L (ref 3.5–5.2)
Sodium: 142 mmol/L (ref 134–144)
Total Protein: 6.8 g/dL (ref 6.0–8.5)
eGFR: 57 mL/min/{1.73_m2} — ABNORMAL LOW (ref >59)

## 2022-10-22 ENCOUNTER — Other Ambulatory Visit: Payer: Self-pay | Admitting: Family

## 2022-10-22 DIAGNOSIS — I10 Essential (primary) hypertension: Secondary | ICD-10-CM

## 2022-10-22 DIAGNOSIS — M1712 Unilateral primary osteoarthritis, left knee: Secondary | ICD-10-CM

## 2022-10-22 DIAGNOSIS — G8929 Other chronic pain: Secondary | ICD-10-CM

## 2022-10-22 DIAGNOSIS — E785 Hyperlipidemia, unspecified: Secondary | ICD-10-CM

## 2022-11-04 ENCOUNTER — Other Ambulatory Visit: Payer: Self-pay | Admitting: Family

## 2022-12-15 ENCOUNTER — Other Ambulatory Visit: Payer: Medicare Other

## 2022-12-15 DIAGNOSIS — Z79899 Other long term (current) drug therapy: Secondary | ICD-10-CM | POA: Diagnosis not present

## 2022-12-26 ENCOUNTER — Encounter: Payer: Self-pay | Admitting: Family

## 2022-12-26 ENCOUNTER — Ambulatory Visit (INDEPENDENT_AMBULATORY_CARE_PROVIDER_SITE_OTHER): Payer: Medicare Other | Admitting: Family

## 2022-12-26 VITALS — BP 127/75 | HR 67 | Temp 97.4°F | Ht 65.0 in | Wt 174.6 lb

## 2022-12-26 DIAGNOSIS — I10 Essential (primary) hypertension: Secondary | ICD-10-CM | POA: Diagnosis not present

## 2022-12-26 DIAGNOSIS — Z0000 Encounter for general adult medical examination without abnormal findings: Secondary | ICD-10-CM | POA: Diagnosis not present

## 2022-12-26 DIAGNOSIS — M545 Low back pain, unspecified: Secondary | ICD-10-CM | POA: Diagnosis not present

## 2022-12-26 DIAGNOSIS — I255 Ischemic cardiomyopathy: Secondary | ICD-10-CM

## 2022-12-26 DIAGNOSIS — K219 Gastro-esophageal reflux disease without esophagitis: Secondary | ICD-10-CM | POA: Diagnosis not present

## 2022-12-26 DIAGNOSIS — E785 Hyperlipidemia, unspecified: Secondary | ICD-10-CM

## 2022-12-26 DIAGNOSIS — E663 Overweight: Secondary | ICD-10-CM

## 2022-12-26 DIAGNOSIS — Z23 Encounter for immunization: Secondary | ICD-10-CM | POA: Diagnosis not present

## 2022-12-26 DIAGNOSIS — G8929 Other chronic pain: Secondary | ICD-10-CM

## 2022-12-26 LAB — CBC WITH DIFFERENTIAL/PLATELET
Basophils Absolute: 0.1 10*3/uL (ref 0.0–0.2)
Basos: 1 %
EOS (ABSOLUTE): 0.2 10*3/uL (ref 0.0–0.4)
Eos: 5 %
Hematocrit: 39.7 % (ref 34.0–46.6)
Hemoglobin: 13.1 g/dL (ref 11.1–15.9)
Immature Grans (Abs): 0 10*3/uL (ref 0.0–0.1)
Immature Granulocytes: 0 %
Lymphocytes Absolute: 1.1 10*3/uL (ref 0.7–3.1)
Lymphs: 30 %
MCH: 31.9 pg (ref 26.6–33.0)
MCHC: 33 g/dL (ref 31.5–35.7)
MCV: 97 fL (ref 79–97)
Monocytes Absolute: 0.4 10*3/uL (ref 0.1–0.9)
Monocytes: 10 %
Neutrophils Absolute: 2.1 10*3/uL (ref 1.4–7.0)
Neutrophils: 54 %
Platelets: 285 10*3/uL (ref 150–450)
RBC: 4.11 x10E6/uL (ref 3.77–5.28)
RDW: 12.1 % (ref 11.7–15.4)
WBC: 3.8 10*3/uL (ref 3.4–10.8)

## 2022-12-26 LAB — TSH

## 2022-12-26 LAB — HEPATIC FUNCTION PANEL
ALT: 18 IU/L (ref 0–32)
AST: 15 IU/L (ref 0–40)
Albumin: 4.1 g/dL (ref 3.9–4.9)
Alkaline Phosphatase: 58 IU/L (ref 44–121)
Bilirubin Total: 0.4 mg/dL (ref 0.0–1.2)
Total Protein: 6.3 g/dL (ref 6.0–8.5)

## 2022-12-26 LAB — LIPID PANEL
Chol/HDL Ratio: 3.2 ratio (ref 0.0–4.4)
Cholesterol, Total: 134 mg/dL (ref 100–199)
HDL: 42 mg/dL (ref >39)
LDL Chol Calc (NIH): 78 mg/dL (ref 0–99)
Triglycerides: 70 mg/dL (ref 0–149)
VLDL Cholesterol Cal: 14 mg/dL (ref 5–40)

## 2022-12-26 NOTE — Progress Notes (Signed)
Subjective:    Patient ID: Veronica Gomez, female    DOB: 1955-08-30, 67 y.o.   MRN: [id number]  Chief Complaint  Patient presents with   Medical Management of Chronic Issues   Pt presents to the office today for chronic follow up. She is followed by Cardiologists every 6 months for HTN and cardiomyopathy.  Hypertension This is a chronic problem. The current episode started more than 1 year ago. The problem has been resolved since onset. The problem is controlled. Pertinent negatives include no malaise/fatigue, peripheral edema or shortness of breath. Risk factors for coronary artery disease include dyslipidemia, obesity and sedentary lifestyle. The current treatment provides moderate improvement.  Gastroesophageal Reflux She complains of belching and heartburn. This is a chronic problem. The current episode started more than 1 year ago. The problem occurs occasionally. The symptoms are aggravated by certain foods. She has tried a PPI for the symptoms. The treatment provided moderate relief.  Hyperlipidemia This is a chronic problem. The current episode started more than 1 year ago. The problem is controlled. Recent lipid tests were reviewed and are normal. Exacerbating diseases include obesity. Pertinent negatives include no shortness of breath. Current antihyperlipidemic treatment includes statins. The current treatment provides moderate improvement of lipids. Risk factors for coronary artery disease include dyslipidemia, hypertension, a sedentary lifestyle and post-menopausal.  Back Pain This is a chronic problem. The current episode started more than 1 year ago. The problem occurs intermittently. The quality of the pain is described as aching. The pain is at a severity of 5/10. The pain is moderate. She has tried bed rest for the symptoms. The treatment provided mild relief.      Review of Systems  Constitutional:  Negative for malaise/fatigue.  Respiratory:  Negative for shortness of  breath.   Gastrointestinal:  Positive for heartburn.  Musculoskeletal:  Positive for back pain.  All other systems reviewed and are negative.  Family History  Problem Relation Age of Onset   Osteoporosis Mother    Asthma Mother    Hypertension Mother    Hypertension Father    Heart disease Father        MI   Social History   Socioeconomic History   Marital status: Married    Spouse name: Not on file   Number of children: Not on file   Years of education: Not on file   Highest education level: Not on file  Occupational History   Not on file  Tobacco Use   Smoking status: Former    Current packs/day: 0.00    Types: Cigarettes    Quit date: 05/15/1988    Years since quitting: 34.6   Smokeless tobacco: Never  Vaping Use   Vaping status: Never Used  Substance and Sexual Activity   Alcohol use: Yes    Comment: occasionally - beer   Drug use: No   Sexual activity: Yes    Birth control/protection: Post-menopausal  Other Topics Concern   Not on file  Social History Narrative   Not on file   Social Determinants of Health   Financial Resource Strain: Low Risk  (04/26/2022)   Overall Financial Resource Strain (CARDIA)    Difficulty of Paying Living Expenses: Not hard at all  Food Insecurity: No Food Insecurity (04/26/2022)   Hunger Vital Sign    Worried About Running Out of Food in the Last Year: Never true    Ran Out of Food in the Last Year: Never true  Transportation Needs: No Transportation Needs (  04/26/2022)   PRAPARE - Administrator, Civil Service (Medical): No    Lack of Transportation (Non-Medical): No  Physical Activity: Inactive (04/26/2022)   Exercise Vital Sign    Days of Exercise per Week: 0 days    Minutes of Exercise per Session: 0 min  Stress: No Stress Concern Present (04/26/2022)   [employer] of Occupational Health - Occupational Stress Questionnaire    Feeling of Stress : Not at all  Social Connections: Socially Integrated  (04/26/2022)   Social Connection and Isolation Panel [NHANES]    Frequency of Communication with Friends and Family: More than three times a week    Frequency of Social Gatherings with Friends and Family: Three times a week    Attends Religious Services: More than 4 times per year    Active Member of Clubs or Organizations: Yes    Attends Banker Meetings: More than 4 times per year    Marital Status: Married       Objective:   Physical Exam Vitals reviewed.  Constitutional:      General: She is not in acute distress.    Appearance: She is well-developed.  HENT:     Head: Normocephalic and atraumatic.     Right Ear: Tympanic membrane normal.     Left Ear: Tympanic membrane normal.  Eyes:     Pupils: Pupils are equal, round, and reactive to light.  Neck:     Thyroid: No thyromegaly.  Cardiovascular:     Rate and Rhythm: Normal rate and regular rhythm.     Heart sounds: Normal heart sounds. No murmur heard. Pulmonary:     Effort: Pulmonary effort is normal. No respiratory distress.     Breath sounds: Normal breath sounds. No wheezing.  Abdominal:     General: Bowel sounds are normal. There is no distension.     Palpations: Abdomen is soft.     Tenderness: There is no abdominal tenderness.  Musculoskeletal:        General: No tenderness. Normal range of motion.     Cervical back: Normal range of motion and neck supple.  Skin:    General: Skin is warm and dry.  Neurological:     Mental Status: She is alert and oriented to person, place, and time.     Cranial Nerves: No cranial nerve deficit.     Deep Tendon Reflexes: Reflexes are normal and symmetric.  Psychiatric:        Behavior: Behavior normal.        Thought Content: Thought content normal.        Judgment: Judgment normal.       BP 127/75   Pulse 67   Temp (!) 97.4 F (36.3 C) (Temporal)   Ht 5\' 5"  (1.651 m)   Wt 174 lb 9.6 oz (79.2 kg)   LMP 09/13/2008 (Approximate)   SpO2 97%   BMI 29.05  kg/m      Assessment & Plan:  Veronica Gomez comes in today with chief complaint of Medical Management of Chronic Issues   Diagnosis and orders addressed:  1. Chronic bilateral low back pain without sciatica - Ambulatory referral to Orthopedics - CBC with Differential/Platelet  2. Dyslipidemia - CBC with Differential/Platelet - Lipid panel  3. Gastroesophageal reflux disease, unspecified whether esophagitis present - CBC with Differential/Platelet  4. Primary hypertension - CBC with Differential/Platelet  5. Cardiomyopathy, ischemic - CBC with Differential/Platelet  6. Overweight (BMI 25.0-29.9) - CBC with Differential/Platelet  7.  Annual physical exam - CBC with Differential/Platelet - Lipid panel - TSH - Hepatic function panel   Labs pending Health Maintenance reviewed Diet and exercise encouraged  Follow up plan: 6 months    Jannifer Rodney, FNP

## 2022-12-26 NOTE — Patient Instructions (Signed)
Acute Back Pain, Adult Acute back pain is sudden and usually short-lived. It is often caused by an injury to the muscles and tissues in the back. The injury may result from: A muscle, tendon, or ligament getting overstretched or torn. Ligaments are tissues that connect bones to each other. Lifting something improperly can cause a back strain. Wear and tear (degeneration) of the spinal disks. Spinal disks are circular tissue that provide cushioning between the bones of the spine (vertebrae). Twisting motions, such as while playing sports or doing yard work. A hit to the back. Arthritis. You may have a physical exam, lab tests, and imaging tests to find the cause of your pain. Acute back pain usually goes away with rest and home care. Follow these instructions at home: Managing pain, stiffness, and swelling Take over-the-counter and prescription medicines only as told by your health care provider. Treatment may include medicines for pain and inflammation that are taken by mouth or applied to the skin, or muscle relaxants. Your health care provider may recommend applying ice during the first 24-48 hours after your pain starts. To do this: Put ice in a plastic bag. Place a towel between your skin and the bag. Leave the ice on for 20 minutes, 2-3 times a day. Remove the ice if your skin turns bright red. This is very important. If you cannot feel pain, heat, or cold, you have a greater risk of damage to the area. If directed, apply heat to the affected area as often as told by your health care provider. Use the heat source that your health care provider recommends, such as a moist heat pack or a heating pad. Place a towel between your skin and the heat source. Leave the heat on for 20-30 minutes. Remove the heat if your skin turns bright red. This is especially important if you are unable to feel pain, heat, or cold. You have a greater risk of getting burned. Activity  Do not stay in bed. Staying in  bed for more than 1-2 days can delay your recovery. Sit up and stand up straight. Avoid leaning forward when you sit or hunching over when you stand. If you work at a desk, sit close to it so you do not need to lean over. Keep your chin tucked in. Keep your neck drawn back, and keep your elbows bent at a 90-degree angle (right angle). Sit high and close to the steering wheel when you drive. Add lower back (lumbar) support to your car seat, if needed. Take short walks on even surfaces as soon as you are able. Try to increase the length of time you walk each day. Do not sit, drive, or stand in one place for more than 30 minutes at a time. Sitting or standing for long periods of time can put stress on your back. Do not drive or use heavy machinery while taking prescription pain medicine. Use proper lifting techniques. When you bend and lift, use positions that put less stress on your back: Bend your knees. Keep the load close to your body. Avoid twisting. Exercise regularly as told by your health care provider. Exercising helps your back heal faster and helps prevent back injuries by keeping muscles strong and flexible. Work with a physical therapist to make a safe exercise program, as recommended by your health care provider. Do any exercises as told by your physical therapist. Lifestyle Maintain a healthy weight. Extra weight puts stress on your back and makes it difficult to have good   posture. Avoid activities or situations that make you feel anxious or stressed. Stress and anxiety increase muscle tension and can make back pain worse. Learn ways to manage anxiety and stress, such as through exercise. General instructions Sleep on a firm mattress in a comfortable position. Try lying on your side with your knees slightly bent. If you lie on your back, put a pillow under your knees. Keep your head and neck in a straight line with your spine (neutral position) when using electronic equipment like  smartphones or pads. To do this: Raise your smartphone or pad to look at it instead of bending your head or neck to look down. Put the smartphone or pad at the level of your face while looking at the screen. Follow your treatment plan as told by your health care provider. This may include: Cognitive or behavioral therapy. Acupuncture or massage therapy. Meditation or yoga. Contact a health care provider if: You have pain that is not relieved with rest or medicine. You have increasing pain going down into your legs or buttocks. Your pain does not improve after 2 weeks. You have pain at night. You lose weight without trying. You have a fever or chills. You develop nausea or vomiting. You develop abdominal pain. Get help right away if: You develop new bowel or bladder control problems. You have unusual weakness or numbness in your arms or legs. You feel faint. These symptoms may represent a serious problem that is an emergency. Do not wait to see if the symptoms will go away. Get medical help right away. Call your local emergency services (911 in the [place] Do not drive yourself to the hospital. Summary Acute back pain is sudden and usually short-lived. Use proper lifting techniques. When you bend and lift, use positions that put less stress on your back. Take over-the-counter and prescription medicines only as told by your health care provider, and apply heat or ice as told. This information is not intended to replace advice given to you by your health care provider. Make sure you discuss any questions you have with your health care provider. Document Revised: 07/23/2020 Document Reviewed: 07/23/2020 Elsevier Patient Education  2024 Elsevier Inc.  

## 2022-12-26 NOTE — Addendum Note (Signed)
Addended by: Ignacia Bayley on: 12/26/2022 09:37 AM   Modules accepted: Orders

## 2022-12-31 ENCOUNTER — Other Ambulatory Visit: Payer: Self-pay | Admitting: Family

## 2022-12-31 DIAGNOSIS — I10 Essential (primary) hypertension: Secondary | ICD-10-CM

## 2022-12-31 DIAGNOSIS — M1712 Unilateral primary osteoarthritis, left knee: Secondary | ICD-10-CM

## 2022-12-31 DIAGNOSIS — G8929 Other chronic pain: Secondary | ICD-10-CM

## 2022-12-31 DIAGNOSIS — E785 Hyperlipidemia, unspecified: Secondary | ICD-10-CM

## 2022-12-31 DIAGNOSIS — K219 Gastro-esophageal reflux disease without esophagitis: Secondary | ICD-10-CM

## 2023-01-07 ENCOUNTER — Other Ambulatory Visit: Payer: Self-pay | Admitting: Family

## 2023-01-07 DIAGNOSIS — B351 Tinea unguium: Secondary | ICD-10-CM

## 2023-01-08 ENCOUNTER — Telehealth: Payer: Self-pay | Admitting: Family

## 2023-01-08 NOTE — Telephone Encounter (Signed)
Lmtcb.

## 2023-01-08 NOTE — Telephone Encounter (Signed)
Calling to check and see what is going on with patient prescription

## 2023-01-17 ENCOUNTER — Encounter: Payer: Self-pay | Admitting: Internal Medicine

## 2023-01-24 ENCOUNTER — Ambulatory Visit (INDEPENDENT_AMBULATORY_CARE_PROVIDER_SITE_OTHER): Payer: Medicare Other | Admitting: Orthopaedic Surgery

## 2023-01-24 ENCOUNTER — Other Ambulatory Visit (INDEPENDENT_AMBULATORY_CARE_PROVIDER_SITE_OTHER): Payer: Medicare Other

## 2023-01-24 ENCOUNTER — Encounter: Payer: Self-pay | Admitting: Orthopaedic Surgery

## 2023-01-24 VITALS — Ht 64.0 in | Wt 175.0 lb

## 2023-01-24 DIAGNOSIS — G8929 Other chronic pain: Secondary | ICD-10-CM

## 2023-01-24 DIAGNOSIS — M5440 Lumbago with sciatica, unspecified side: Secondary | ICD-10-CM

## 2023-01-24 NOTE — Progress Notes (Signed)
Office Visit Note   Patient: Veronica Gomez           Date of Birth: Oct 28, 1955           MRN: [id number] Visit Date: 01/24/2023              Requested by: Junie Spencer, FNP [address] [place],  [place] [address] PCP: Junie Spencer, FNP   Assessment & Plan: Visit Diagnoses:  1. Chronic midline low back pain with sciatica, sciatica laterality unspecified   2. Chronic bilateral low back pain without sciatica     Plan: Patient is always already very active she takes calcium she has had a bone density test.  She has some progressive lumbar curvature multilevel disc degeneration with some retrolisthesis but no radicular findings no nerve root tension signs and no claudication symptoms.  Continue stretching and walking.  Return if she develops either neurogenic claudication or progressive radicular symptoms.  Follow-Up Instructions: No follow-ups on file.   Orders:  Orders Placed This Encounter  Procedures   XR Lumbar Spine 2-3 Views   No orders of the defined types were placed in this encounter.     Procedures: No procedures performed   Clinical Data: No additional findings.   Subjective: Chief Complaint  Patient presents with   Lower Back - Pain    Pain and it radiates down left leg occasional. Having muscle spasms, uncomfortable with bending and standing.    HPI 67 year old female with back pain she denies neurogenic claudication symptoms occasionally she has some pain in her left leg.  She has been on diclofenac and also baclofen.  She takes calcium and vitamin D.  She quit smoking 35 years ago.  Additionally she has a history of ischemic cardiomyopathy hypertension left bundle branch block.  She is able to walk more than a mile with no problem.  She is very active during the day.  Review of Systems all systems noncontributory to HPI.   Objective: Vital Signs: Ht 5\' 4"  (1.626 m)   Wt 175 lb (79.4 kg)   LMP 09/13/2008 (Approximate)   BMI 30.04 kg/m    Physical Exam Constitutional:      Appearance: She is well-developed.  HENT:     Head: Normocephalic.     Right Ear: External ear normal.     Left Ear: External ear normal. There is no impacted cerumen.  Eyes:     Pupils: Pupils are equal, round, and reactive to light.  Neck:     Thyroid: No thyromegaly.     Trachea: No tracheal deviation.  Cardiovascular:     Rate and Rhythm: Normal rate.  Pulmonary:     Effort: Pulmonary effort is normal.  Abdominal:     Palpations: Abdomen is soft.  Musculoskeletal:     Cervical back: No rigidity.  Skin:    General: Skin is warm and dry.  Neurological:     Mental Status: She is alert and oriented to person, place, and time.  Psychiatric:        Behavior: Behavior normal.     Ortho Exam patient has some pelvic obliquity with left side 1 cm lower.  Left upper lumbar curvature.  Negative straight leg raising 90 degrees.  She is able to heel and toe walk without problems.  No atrophy or upper extremity weakness.  Specialty Comments:  No specialty comments available.  Imaging: XR Lumbar Spine 2-3 Views  Result Date: 01/24/2023 AP lateral lumbar spine images are obtained  and reviewed.  Comparison to 07/12/2021 images.  Patient had progressive upper lumbar disc degeneration with left lumbar curvature now approximately 30 degrees.  Multilevel disc degeneration with some retrolisthesis at L1-2 L2-3 and L3-4.  Multilevel disc narrowing.  Hip joints pelvis are unremarkable. 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    PMFS History: Patient Active Problem List   Diagnosis Date Noted   Chronic bilateral low back pain without sciatica 02/03/2022   Combined forms of age-related cataract of left eye 10/15/2018   Regular astigmatism, left eye 10/15/2018   Chronic heart disease 10/08/2018   Gastroesophageal reflux disease 07/25/2017   Allergic rhinitis due to allergen  08/25/2016   Overweight (BMI 25.0-29.9) 09/14/2014   HTN (hypertension) 11/20/2013   Dyslipidemia 11/20/2013   Cardiomyopathy, ischemic 11/20/2013   LBBB (left bundle branch block) 11/20/2013   Past Medical History:  Diagnosis Date   CAD (coronary artery disease)    Cardiomyopathy (HCC)    GERD (gastroesophageal reflux disease)    Hyperlipidemia    Hypertension    Ischemia    Left bundle branch block (LBBB)     Family History  Problem Relation Age of Onset   Osteoporosis Mother    Asthma Mother    Hypertension Mother    Hypertension Father    Heart disease Father        MI    Past Surgical History:  Procedure Laterality Date   CERVICAL POLYPECTOMY  2010   FINGER SURGERY  2008   SHOULDER SURGERY Right 2014   TUBAL LIGATION  1988   Social History   Occupational History   Not on file  Tobacco Use   Smoking status: Former    Current packs/day: 0.00    Types: Cigarettes    Quit date: 05/15/1988    Years since quitting: 34.7   Smokeless tobacco: Never  Vaping Use   Vaping status: Never Used  Substance and Sexual Activity   Alcohol use: Yes    Comment: occasionally - beer   Drug use: No   Sexual activity: Yes    Birth control/protection: Post-menopausal

## 2023-01-31 DIAGNOSIS — Z1231 Encounter for screening mammogram for malignant neoplasm of breast: Secondary | ICD-10-CM | POA: Diagnosis not present

## 2023-02-26 ENCOUNTER — Encounter: Payer: Self-pay | Admitting: Family

## 2023-03-09 DIAGNOSIS — M532X6 Spinal instabilities, lumbar region: Secondary | ICD-10-CM | POA: Diagnosis not present

## 2023-03-09 DIAGNOSIS — M4156 Other secondary scoliosis, lumbar region: Secondary | ICD-10-CM | POA: Diagnosis not present

## 2023-03-09 DIAGNOSIS — M41126 Adolescent idiopathic scoliosis, lumbar region: Secondary | ICD-10-CM | POA: Diagnosis not present

## 2023-03-19 ENCOUNTER — Ambulatory Visit: Payer: Medicare Other | Attending: Internal Medicine | Admitting: Internal Medicine

## 2023-03-19 ENCOUNTER — Encounter: Payer: Self-pay | Admitting: Internal Medicine

## 2023-03-19 VITALS — BP 136/82 | HR 65 | Ht 63.0 in | Wt 174.0 lb

## 2023-03-19 DIAGNOSIS — I5022 Chronic systolic (congestive) heart failure: Secondary | ICD-10-CM

## 2023-03-19 DIAGNOSIS — I447 Left bundle-branch block, unspecified: Secondary | ICD-10-CM

## 2023-03-19 DIAGNOSIS — I10 Essential (primary) hypertension: Secondary | ICD-10-CM | POA: Diagnosis not present

## 2023-03-19 DIAGNOSIS — I428 Other cardiomyopathies: Secondary | ICD-10-CM

## 2023-03-19 NOTE — Patient Instructions (Addendum)
Medication Instructions:  NO CHANGES  Follow-Up: At [facility], you and your health needs are our priority.  As part of our continuing mission to provide you with exceptional heart care, we have created designated Provider Care Teams.  These Care Teams include your primary Cardiologist (physician) and Advanced Practice Providers (APPs -  Physician Assistants and Nurse Practitioners) who all work together to provide you with the care you need, when you need it.  We recommend signing up for the patient portal called "MyChart".  Sign up information is provided on this After Visit Summary.  MyChart is used to connect with patients for Virtual Visits (Telemedicine).  Patients are able to view lab/test results, encounter notes, upcoming appointments, etc.  Non-urgent messages can be sent to your provider as well.   To learn more about what you can do with MyChart, go to [email or web address].    Your next appointment:   6 months with Dr. Rennis Golden  Please call in January for your appointment

## 2023-03-19 NOTE — Progress Notes (Unsigned)
OFFICE NOTE  Chief Complaint:  No complaints  Primary Care Physician: Junie Spencer, FNP  HPI:  Veronica Gomez is a pleasant 67 year old female who moved to [place] in January of this year. Her past echo history is significant for hypertension, dyslipidemia and a former smoker, but quit over 25 years ago. 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 In 2009 she was noted to have an interventricular conduction delay, and was referred for stress test which was negative for ischemia. Records indicate 2013 she was having some chest discomfort and had a new, clear left bundle branch block. She underwent a nuclear stress test shows a fixed inferoapical defect. EF was 56%. An echocardiogram performed around the same time showed an EF of 40% with apical inferior wall hypokinesis. This was determined to be scar, no further workup was recommended as her symptoms were not thought to be due to ischemia. No ischemia was noted on her stress test. She is on appropriate medical therapy including aspirin, statin, ACE inhibitor, beta blocker and diuretic. She is quite active around her house and into your and denies any chest pain or worsening shortness of breath. Her weight has been stable. Her last office visit with her cardiologist was in December 2014 no changes to her medical regimen were recommended at that time. She did have a recent lipid profile which showed total cholesterol 162 and LDL of 99.  I saw Veronica Gomez back in the office today. She has no new complaints. We recently did lipid testing which shows her LDL cholesterol to be 99. That is still higher than her goal LDL of less than 70. We talked about possibly adjusting her Lipitor up however she reported she had side effects with that in the past.  Veronica Gomez returns today for follow-up. Overall she is feeling well. She denies any worsening shortness of breath or chest pain. Blood pressure has been very well  controlled. Recently she had discussed medicine changes which were planning based on her cholesterol not being quite at goal. She was concerned about myalgias on higher dose statin and therefore I recommended adding Avandia. Alternatively her primary care provider suggested she could take a double dose of Lipitor every other day. She's been doing that for at least several months and is due for recheck of her cholesterol. She currently denies any myalgias or myopathy  I had the pleasure seeing Veronica Gomez back today in the office. Slightly earlier than our normal follow-up appointment. She's changed insurance companies and is interested in starting some more exercise. She denies any chest pain but does get short of breath with activity. She was due for repeat echocardiogram which I like to get to see if she's had any change in her LV function. She is currently on aspirin, Lipitor, carvedilol and lisinopril.  08/25/2016  Veronica Gomez returns today for follow-up. Overall she is doing well. She denies any worsening shortness of breath or chest pain. She has NYHA class I symptoms. Based on this she does not qualify for Entresto. She is currently on carvedilol and lisinopril although but has been having recent cough. I think this is allergies but her PCP did write to switch her from lisinopril over to losartan. She reports some rhinitis symptoms as well as itchy eyes and no significant pollen burden now. I suggested she may benefit from using a nasal steroid and saline nasal spray. She's currently using cetirizine and diphenhydramine. She should avoid  any decongestants such as pseudoephedrine.  12/01/2019  Veronica Gomez is seen today in follow-up.  I last saw her via virtual visit during the pandemic about a year ago.  Since then she has done well.  She denies any worsening shortness of breath or chest pain.  She reports her weight has been up or down but is a few pounds lower than it was before.  She is overdue for a  repeat lipid profile.  I previously increased her atorvastatin up to 40 mg to try to reach a target LDL less than 70.  Also her last echocardiogram was in 2017 again showing an ischemic cardiomyopathy with EF of 40%.  She continues to have NYHA class I symptoms.  01/28/2021  Veronica Gomez returns today for follow-up.  LVEF has remained low at 40 to 45% with some regional wall motion abnormalities however compared to to a prior study in 2017 is stable.  She is on historic heart failure medications but is not on any of the newer guideline directed medical therapies.  We discussed the possibility of switching her ARB to [facility] today and the fact that it might be helpful with improving her LV function although I do believe it is possibly related to bundle branch block.  11/07/2021  Veronica Gomez is seen today in follow-up.  Overall she seems to be doing well.  She had seen Jari Favre, PA-C in follow-up in January.  Her Sherryll Burger has been further titrated.  Then she was seen by our pharmacist and started on Jardiance as she was unable to tolerate [place].  She seems to be doing well on the Jardiance.  Blood pressures at home have been solidly well controlled in the 120s over 80s.  She has no more than NYHA class I symptoms.  05/04/2022  Veronica Gomez is seen today in follow-up.  Overall she seems to be doing pretty well.  No chest pain or shortness of breath was noted.  She is on stable heart failure therapy.  She could not tolerate the SGLT2 inhibitors.  This was related to urinary tract infections.  She is on Entresto and carvedilol.  Her LVEF is stayed fairly stable on echo at 43%.  She has NYHA class I symptoms.  EKG today shows sinus rhythm with a left bundle branch block.  09/13/2022  Veronica Gomez is seen today in follow-up.  She underwent a cardiac MRI which fortunately showed no prior scar.  EF has been consistently in the low 40s and was 43% on the MRI.  No evidence of gadolinium enhancement.  It was speculated  that there may be cardiomyopathy related to left bundle branch block.  I do think this is the case.  However she continues to endorse no more than NYHA class I symptoms.  Based on that she would not really be a candidate for CRT therapy and her EF is a little too high.  I also considered CCM therapy however this is really for class III or IV heart failure patients.  Blood pressure continues to be a little elevated.  She says it is never really below 120/80.  That may indicate there is room for titrating her Entresto.  PMHx:  Past Medical History:  Diagnosis Date   CAD (coronary artery disease)    Cardiomyopathy (HCC)    GERD (gastroesophageal reflux disease)    Hyperlipidemia    Hypertension    Ischemia    Left bundle branch block (LBBB)     Past Surgical History:  Procedure  Laterality Date   CERVICAL POLYPECTOMY  2010   FINGER SURGERY  2008   SHOULDER SURGERY Right 2014   TUBAL LIGATION  1988    FAMHx:  Family History  Problem Relation Age of Onset   Osteoporosis Mother    Asthma Mother    Hypertension Mother    Hypertension Father    Heart disease Father        MI    SOCHx:   reports that she quit smoking about 34 years ago. Her smoking use included cigarettes. She has never used smokeless tobacco. She reports current alcohol use. She reports that she does not use drugs.  ALLERGIES:  Allergies  Allergen Reactions   Farxiga [Dapagliflozin]     myalgias   Jardiance [Empagliflozin]     Yeast     ROS: Pertinent items noted in HPI and remainder of comprehensive ROS otherwise negative.  HOME MEDS: Current Outpatient Medications  Medication Sig Dispense Refill   Ascorbic Acid (VITAMIN C PO) Vitamin C Chewable 500 mg tablet   1 tablet every day by oral route.     aspirin 81 MG tablet Take 81 mg by mouth daily.     atorvastatin (LIPITOR) 40 MG tablet TAKE 1 TABLET BY MOUTH DAILY 100 tablet 1   baclofen (LIORESAL) 10 MG tablet TAKE 1 TABLET BY MOUTH THREE TIMES A DAY  270 tablet 0   carvedilol (COREG) 6.25 MG tablet TAKE 1 TABLET BY MOUTH TWICE  DAILY WITH A MEAL 200 tablet 1   Cholecalciferol (VITAMIN D3) 25 MCG (1000 UT) CAPS Take by mouth daily.     cyanocobalamin (VITAMIN B12) 1000 MCG tablet Take 3,000 mcg by mouth daily.     diclofenac (VOLTAREN) 75 MG EC tablet TAKE 1 TABLET BY MOUTH TWICE  DAILY 200 tablet 1   diclofenac Sodium (VOLTAREN) 1 % GEL Apply 2 g topically 4 (four) times daily. 350 g 1   hydrochlorothiazide (HYDRODIURIL) 25 MG tablet TAKE 1 TABLET (25 MG TOTAL) BY MOUTH DAILY. 90 tablet 0   Melatonin-Pyridoxine (MELATIN PO) Take by mouth.     omeprazole (PRILOSEC) 20 MG capsule TAKE 1 CAPSULE BY MOUTH DAILY 100 capsule 1   sacubitril-valsartan (ENTRESTO) 97-103 MG Take 1 tablet by mouth 2 (two) times daily. 180 tablet 3   terbinafine (LAMISIL) 250 MG tablet Take 1 tablet (250 mg total) by mouth daily. 90 tablet 0   No current facility-administered medications for this visit.    LABS/IMAGING: No results found for this or any previous visit (from the past 48 hour(s)). No results found.  VITALS: BP 136/82   Pulse 65   Ht 5\' 3"  (1.6 m)   Wt 174 lb (78.9 kg)   LMP 09/13/2008 (Approximate)   SpO2 99%   BMI 30.82 kg/m   EXAM: General appearance: alert and no distress Neck: no carotid bruit and no JVD Lungs: clear to auscultation bilaterally Heart: regular rate and rhythm, S1, S2 normal, no murmur, click, rub or gallop Abdomen: soft, non-tender; bowel sounds normal; no masses,  no organomegaly Extremities: extremities normal, atraumatic, no cyanosis or edema Pulses: 2+ and symmetric Skin: Skin color, texture, turgor normal. No rashes or lesions Neurologic: Grossly normal Psych: Mood, affect normal  EKG: Deferred  ASSESSMENT: Non-ischemic cardiomyopathy, LVEF 43% by cMRI - no scar or infiltrative process (07/2022) Fixed inferoapical defect suggestive of scar on prior nuclear stress testing Chronic  LBBB Hypertension Dyslipidemia Former smoker  PLAN: 1.   Veronica Gomez underwent cardiac MRI which showed  EF of 43% but no evidence of scar or infiltrative process.  She had a previous fixed defect on Myoview testing thought to be a scar however this was likely a left bundle branch block related artifact.  I suspect she has left bundle branch related cardiomyopathy.  As there is still some room and blood pressure I like to uptitrate her to the max dose of Entresto.  She cannot tolerate SGLT2 inhibitors due to infection and is on aspirin, statin and carvedilol.  She still endorses only NYHA class I symptoms.  Will continue with this change in therapy and reassess LV function by echocardiography in 6 to 12 months.  Chrystie Nose, MD, [facility], FACP  [place]  [facility]  Medical Director of the [facility]  [facility] Diplomate of the American Board of Clinical Lipidology Attending Cardiologist  Direct Dial: [phone]  Fax: [phone]  Website:  [email or web address] Nicely Micaiah Litle 03/19/2023, 9:35 AM

## 2023-03-20 ENCOUNTER — Encounter: Payer: Self-pay | Admitting: Physical Therapy

## 2023-03-20 ENCOUNTER — Ambulatory Visit: Payer: Medicare Other | Attending: Orthopedic Surgery | Admitting: Physical Therapy

## 2023-03-20 ENCOUNTER — Other Ambulatory Visit: Payer: Self-pay

## 2023-03-20 ENCOUNTER — Encounter: Payer: Self-pay | Admitting: Family

## 2023-03-20 DIAGNOSIS — M5459 Other low back pain: Secondary | ICD-10-CM | POA: Insufficient documentation

## 2023-03-20 DIAGNOSIS — R293 Abnormal posture: Secondary | ICD-10-CM | POA: Diagnosis not present

## 2023-03-20 NOTE — Therapy (Signed)
OUTPATIENT PHYSICAL THERAPY THORACOLUMBAR EVALUATION   Patient Name: Veronica Gomez MRN: [id number] DOB:Aug 03, 1955, 67 y.o., female Today's Date: 03/20/2023  END OF SESSION:  PT End of Session - 03/20/23 1033     Visit Number 1    Number of Visits 8    Date for PT Re-Evaluation 04/17/23    PT Start Time 0933    PT Stop Time 1023    PT Time Calculation (min) 50 min    Activity Tolerance Patient tolerated treatment well    Behavior During Therapy [facility] for tasks assessed/performed             Past Medical History:  Diagnosis Date   CAD (coronary artery disease)    Cardiomyopathy (HCC)    GERD (gastroesophageal reflux disease)    Hyperlipidemia    Hypertension    Ischemia    Left bundle branch block (LBBB)    Past Surgical History:  Procedure Laterality Date   CERVICAL POLYPECTOMY  2010   FINGER SURGERY  2008   SHOULDER SURGERY Right 2014   TUBAL LIGATION  1988   Patient Active Problem List   Diagnosis Date Noted   Chronic bilateral low back pain without sciatica 02/03/2022   Combined forms of age-related cataract of left eye 10/15/2018   Regular astigmatism, left eye 10/15/2018   Chronic heart disease 10/08/2018   Gastroesophageal reflux disease 07/25/2017   Allergic rhinitis due to allergen 08/25/2016   Overweight (BMI 25.0-29.9) 09/14/2014   HTN (hypertension) 11/20/2013   Dyslipidemia 11/20/2013   Cardiomyopathy, ischemic 11/20/2013   LBBB (left bundle branch block) 11/20/2013   REFERRING PROVIDER: Malachy Chamber MD  REFERRING DIAG: Spinal instability of lumbar region.  Rationale for Evaluation and Treatment: Rehabilitation  THERAPY DIAG:  Other low back pain  Abnormal posture  ONSET DATE: Ongoing over many years.  SUBJECTIVE:                                                                                                                                                                                           SUBJECTIVE STATEMENT: The patient  presents to the clinic with c/o of LBP that she has had for many years.  She states, however, that for over a year her LBP has become constant.  She also states that an X-ray f/u of her spine showed that her scoliosis had worsened significantly  At rest/sitted her pain is moderate but increased standing and walking increases her pain which result in muscle spasms and the pain can become severe.    PERTINENT HISTORY:  See above.  PAIN:  Are you having pain? Yes: NPRS scale: Moderate/10 Pain location:  Across low back. Pain description: Ache, sore, throbbing and numb. Aggravating factors: As above. Relieving factors: "Relaxing and heat."  PRECAUTIONS: None  RED FLAGS: None   WEIGHT BEARING RESTRICTIONS: No  FALLS:  Has patient fallen in last 6 months? No  LIVING ENVIRONMENT: Lives with: lives with their spouse Lives in: House/apartment Has following equipment at home: None  OCCUPATION: Retired.  PLOF: Independent with basic ADLs.  She has to pace herself.  PATIENT GOALS: Wants to get an MRI.  OBJECTIVE:  Note: Objective measures were completed at Evaluation unless otherwise noted.  DIAGNOSTIC FINDINGS:  01/24/23:  AP lateral lumbar spine images are obtained and reviewed.  Comparison to  07/12/2021 images.  Patient had progressive upper lumbar disc degeneration  with left lumbar curvature now approximately 30 degrees.  Multilevel disc  degeneration with some retrolisthesis at L1-2 L2-3 and L3-4.  Multilevel  disc narrowing.  Hip joints pelvis are unremarkable.   Impression: Progressive lumbar disc degeneration with left lumbar  curvature and multilevel disc space narrowing.      POSTURE: rounded shoulders and forward head, right iliac crest higher and scoliosis observed.  PALPATION: Patient c/o diffuse pain across her LE region.  LUMBAR ROM:   Active trunk flexion is 65 degrees and extension is 10 degrees.  LOWER EXTREMITY MMT:    Normal LE strength.  LUMBAR  SPECIAL TESTS:  Equal leg lengths. (-) SLR and FABER testing.  Right Achilles reflex is absent.  GAIT: The patient's gait was remarkable for mild antalgia today.  TODAY'S TREATMENT:                                                                                                                              DATE: HMP and IFC at 80-150 Hz on 40% scan x 20 minutes to patient's bilateral lower lumbar region.   Normal modality response following removal of modality.   PATIENT EDUCATION:    HOME EXERCISE PROGRAM:   ASSESSMENT:  CLINICAL IMPRESSION: The patient presents to OPPT with c/o chronic low back pain over many years that has gotten much worse over the last year+.  She has notable scoliosis that she states was gotten worse.  She has diffuse pain across her lower lumbar region.  Special testing is negative.  Her LE strength is normal.  Her pain rises to severe levels with increased standing and walking.  She has to pace herself when performing ADL's due to rising pain-levels.  She hopes to get an MRI.    OBJECTIVE IMPAIRMENTS: decreased activity tolerance, decreased ROM, increased muscle spasms, postural dysfunction, and pain.   ACTIVITY LIMITATIONS: carrying, lifting, bending, standing, and locomotion level  PARTICIPATION LIMITATIONS: meal prep, cleaning, laundry, community activity, and yard work  PERSONAL FACTORS: Time since onset of injury/illness/exacerbation and 1 comorbidity: scolosis  are also affecting patient's functional outcome.   REHAB POTENTIAL: Fair plus/good-  CLINICAL DECISION MAKING: Evolving/moderate complexity  EVALUATION COMPLEXITY: Low   GOALS:  SHORT  TERM GOALS: Target date: 04/17/23.  Ind with a HEP. Goal status: INITIAL  2.  Stand 20 minutes with pain not > 4/10.  Goal status: INITIAL  3.  Walk a community distance with pain not > 4-5/10.  Goal status: INITIAL  4.  Perform ADL's with pain not > 4-5/10.  Goal status: INITIAL  PLAN:  PT  FREQUENCY: 2x/week  PT DURATION: 4 weeks  PLANNED INTERVENTIONS: 97110-Therapeutic exercises, 97530- Therapeutic activity, [id number]- Neuromuscular re-education, 97535- Self Care, 16109- Manual therapy, 97014- Electrical stimulation (unattended), 97035- Ultrasound, Patient/Family education, Cryotherapy, and Moist heat.  PLAN FOR NEXT SESSION: Combo e'stim/US, STW/M, Core exercise progression, spinal protection techniques and body mechanics training.    Peityn Payton, [place], PT 03/20/2023, 11:31 AM

## 2023-03-23 ENCOUNTER — Ambulatory Visit: Payer: Medicare Other | Admitting: *Deleted

## 2023-03-23 DIAGNOSIS — M5459 Other low back pain: Secondary | ICD-10-CM

## 2023-03-23 DIAGNOSIS — R293 Abnormal posture: Secondary | ICD-10-CM

## 2023-03-23 NOTE — Therapy (Signed)
OUTPATIENT PHYSICAL THERAPY THORACOLUMBAR TREATMENT   Patient Name: Veronica Gomez MRN: [id number] DOB:1955-12-07, 67 y.o., female Today's Date: 03/23/2023  END OF SESSION:  PT End of Session - 03/23/23 0942     Visit Number 2    Number of Visits 8    Date for PT Re-Evaluation 04/17/23    PT Start Time 0930    PT Stop Time 1020    PT Time Calculation (min) 50 min             Past Medical History:  Diagnosis Date   CAD (coronary artery disease)    Cardiomyopathy (HCC)    GERD (gastroesophageal reflux disease)    Hyperlipidemia    Hypertension    Ischemia    Left bundle branch block (LBBB)    Past Surgical History:  Procedure Laterality Date   CERVICAL POLYPECTOMY  2010   FINGER SURGERY  2008   SHOULDER SURGERY Right 2014   TUBAL LIGATION  1988   Patient Active Problem List   Diagnosis Date Noted   Chronic bilateral low back pain without sciatica 02/03/2022   Combined forms of age-related cataract of left eye 10/15/2018   Regular astigmatism, left eye 10/15/2018   Chronic heart disease 10/08/2018   Gastroesophageal reflux disease 07/25/2017   Allergic rhinitis due to allergen 08/25/2016   Overweight (BMI 25.0-29.9) 09/14/2014   HTN (hypertension) 11/20/2013   Dyslipidemia 11/20/2013   Cardiomyopathy, ischemic 11/20/2013   LBBB (left bundle branch block) 11/20/2013   REFERRING PROVIDER: Malachy Chamber MD  REFERRING DIAG: Spinal instability of lumbar region.  Rationale for Evaluation and Treatment: Rehabilitation  THERAPY DIAG:  Other low back pain  Abnormal posture  ONSET DATE: Ongoing over many years.  SUBJECTIVE:                                                                                                                                                                                           SUBJECTIVE STATEMENT: The patient presents to the clinic with c/o of LBP that she has had for many years. Pain at times 7/10   PERTINENT HISTORY:  See  above.  PAIN:  Are you having pain? Yes: NPRS scale: Moderate/10 Pain location: Across low back. Pain description: Ache, sore, throbbing and numb. Aggravating factors: As above. Relieving factors: "Relaxing and heat."  PRECAUTIONS: None  RED FLAGS: None   WEIGHT BEARING RESTRICTIONS: No  FALLS:  Has patient fallen in last 6 months? No  LIVING ENVIRONMENT: Lives with: lives with their spouse Lives in: House/apartment Has following equipment at home: None  OCCUPATION: Retired.  PLOF: Independent with basic ADLs.  She has to  pace herself.  PATIENT GOALS: Wants to get an MRI.  OBJECTIVE:  Note: Objective measures were completed at Evaluation unless otherwise noted.  DIAGNOSTIC FINDINGS:  01/24/23:  AP lateral lumbar spine images are obtained and reviewed.  Comparison to  07/12/2021 images.  Patient had progressive upper lumbar disc degeneration  with left lumbar curvature now approximately 30 degrees.  Multilevel disc  degeneration with some retrolisthesis at L1-2 L2-3 and L3-4.  Multilevel  disc narrowing.  Hip joints pelvis are unremarkable.   Impression: Progressive lumbar disc degeneration with left lumbar  curvature and multilevel disc space narrowing.      POSTURE: rounded shoulders and forward head, right iliac crest higher and scoliosis observed.  PALPATION: Patient c/o diffuse pain across her LE region.  LUMBAR ROM:   Active trunk flexion is 65 degrees and extension is 10 degrees.  LOWER EXTREMITY MMT:    Normal LE strength.  LUMBAR SPECIAL TESTS:  Equal leg lengths. (-) SLR and FABER testing.  Right Achilles reflex is absent.  GAIT: The patient's gait was remarkable for mild antalgia today.  TODAY'S TREATMENT:                                                                                                                              DATE:                                                                 03/23/23  Nustep x 8 mins L4 Discussion of AB  bracing with transitional movements and ADL's to decrease pain triggers [place] estim combo @ 1.5 w/cm2 x 10 mins to Bil. LB paras with Pt RT side lying  HMP and IFC at 80-150 Hz on 40% scan x 15 minutes to patient's bilateral lower lumbar region.   Normal modality response following removal of modality.   PATIENT EDUCATION:    HOME EXERCISE PROGRAM:   ASSESSMENT:  CLINICAL IMPRESSION: The patient presents to OPPT with c/o chronic low back pain over many years that has gotten much worse over the last year. Today's Rx focused on Core activation with AB bracing and discussion of movement patterns to decrease pain triggers with ADL's. [place] combo was also performed to Bil. LB paras as well as IFC. Pt did well after session with decreased mm tightness and pain.    OBJECTIVE IMPAIRMENTS: decreased activity tolerance, decreased ROM, increased muscle spasms, postural dysfunction, and pain.   ACTIVITY LIMITATIONS: carrying, lifting, bending, standing, and locomotion level  PARTICIPATION LIMITATIONS: meal prep, cleaning, laundry, community activity, and yard work  PERSONAL FACTORS: Time since onset of injury/illness/exacerbation and 1 comorbidity: scolosis  are also affecting patient's functional outcome.   REHAB POTENTIAL: Fair plus/good-  CLINICAL DECISION MAKING: Evolving/moderate complexity  EVALUATION COMPLEXITY: Low   GOALS:  SHORT TERM GOALS: Target date: 04/17/23.  Ind with a HEP. Goal status: INITIAL  2.  Stand 20 minutes with pain not > 4/10.  Goal status: INITIAL  3.  Walk a community distance with pain not > 4-5/10.  Goal status: INITIAL  4.  Perform ADL's with pain not > 4-5/10.  Goal status: INITIAL  PLAN:  PT FREQUENCY: 2x/week  PT DURATION: 4 weeks  PLANNED INTERVENTIONS: 97110-Therapeutic exercises, 97530- Therapeutic activity, [id number]- Neuromuscular re-education, 97535- Self Care, 47829- Manual therapy, 97014- Electrical stimulation (unattended), 97035- Ultrasound,  Patient/Family education, Cryotherapy, and Moist heat.  PLAN FOR NEXT SESSION: Combo e'stim/US, STW/M, Core exercise progression, spinal protection techniques and body mechanics training.    Jaleena Viviani,CHRIS, PTA 03/23/2023, 11:11 AM

## 2023-03-26 ENCOUNTER — Encounter: Payer: Self-pay | Admitting: *Deleted

## 2023-03-26 ENCOUNTER — Ambulatory Visit: Payer: Medicare Other | Admitting: *Deleted

## 2023-03-26 DIAGNOSIS — M5459 Other low back pain: Secondary | ICD-10-CM

## 2023-03-26 DIAGNOSIS — R293 Abnormal posture: Secondary | ICD-10-CM | POA: Diagnosis not present

## 2023-03-26 NOTE — Therapy (Signed)
OUTPATIENT PHYSICAL THERAPY THORACOLUMBAR TREATMENT   Patient Name: Veronica Gomez MRN: [id number] DOB:13-Jun-1955, 67 y.o., female Today's Date: 03/26/2023  END OF SESSION:  PT End of Session - 03/26/23 1036     Visit Number 3    Number of Visits 8    Date for PT Re-Evaluation 04/17/23    PT Start Time 0930    PT Stop Time 1020    PT Time Calculation (min) 50 min              Past Medical History:  Diagnosis Date   CAD (coronary artery disease)    Cardiomyopathy (HCC)    GERD (gastroesophageal reflux disease)    Hyperlipidemia    Hypertension    Ischemia    Left bundle branch block (LBBB)    Past Surgical History:  Procedure Laterality Date   CERVICAL POLYPECTOMY  2010   FINGER SURGERY  2008   SHOULDER SURGERY Right 2014   TUBAL LIGATION  1988   Patient Active Problem List   Diagnosis Date Noted   Chronic bilateral low back pain without sciatica 02/03/2022   Combined forms of age-related cataract of left eye 10/15/2018   Regular astigmatism, left eye 10/15/2018   Chronic heart disease 10/08/2018   Gastroesophageal reflux disease 07/25/2017   Allergic rhinitis due to allergen 08/25/2016   Overweight (BMI 25.0-29.9) 09/14/2014   HTN (hypertension) 11/20/2013   Dyslipidemia 11/20/2013   Cardiomyopathy, ischemic 11/20/2013   LBBB (left bundle branch block) 11/20/2013   REFERRING PROVIDER: Malachy Chamber MD  REFERRING DIAG: Spinal instability of lumbar region.  Rationale for Evaluation and Treatment: Rehabilitation  THERAPY DIAG:  Other low back pain  Abnormal posture  ONSET DATE: Ongoing over many years.  SUBJECTIVE:                                                                                                                                                                                           SUBJECTIVE STATEMENT: The patient reports Pain at times yesterday 7/10. 2-3/10   PERTINENT HISTORY:  See above.  PAIN:  Are you having pain? Yes:  NPRS scale: Moderate/10 Pain location: Across low back. Pain description: Ache, sore, throbbing and numb. Aggravating factors: As above. Relieving factors: "Relaxing and heat."  PRECAUTIONS: None  RED FLAGS: None   WEIGHT BEARING RESTRICTIONS: No  FALLS:  Has patient fallen in last 6 months? No  LIVING ENVIRONMENT: Lives with: lives with their spouse Lives in: House/apartment Has following equipment at home: None  OCCUPATION: Retired.  PLOF: Independent with basic ADLs.  She has to pace herself.  PATIENT GOALS: Wants to get an MRI.  OBJECTIVE:  Note: Objective measures were completed at Evaluation unless otherwise noted.  DIAGNOSTIC FINDINGS:  01/24/23:  AP lateral lumbar spine images are obtained and reviewed.  Comparison to  07/12/2021 images.  Patient had progressive upper lumbar disc degeneration  with left lumbar curvature now approximately 30 degrees.  Multilevel disc  degeneration with some retrolisthesis at L1-2 L2-3 and L3-4.  Multilevel  disc narrowing.  Hip joints pelvis are unremarkable.   Impression: Progressive lumbar disc degeneration with left lumbar  curvature and multilevel disc space narrowing.      POSTURE: rounded shoulders and forward head, right iliac crest higher and scoliosis observed.  PALPATION: Patient c/o diffuse pain across her LE region.  LUMBAR ROM:   Active trunk flexion is 65 degrees and extension is 10 degrees.  LOWER EXTREMITY MMT:    Normal LE strength.  LUMBAR SPECIAL TESTS:  Equal leg lengths. (-) SLR and FABER testing.  Right Achilles reflex is absent.  GAIT: The patient's gait was remarkable for mild antalgia today.  TODAY'S TREATMENT:                                                                                                                              DATE:                                                                 03/26/23  Nustep x 9 mins L4  [place] estim combo @ 1.5 w/cm2 x 12 mins to Bil. LB paras with  Pt RT side lying Manual STW x 5 mins to LB paras HMP and IFC at 80-150 Hz on 40% scan x 15 minutes to patient's bilateral lower lumbar region.   Normal modality response following removal of modality.   PATIENT EDUCATION:    HOME EXERCISE PROGRAM:   ASSESSMENT:  CLINICAL IMPRESSION: Pt reports doing okay after last Rx and having low pain levels this AM. She was able to continue with therex f/b [place] combo and STW to Bil LB paras. Pt did well after session with decreased mm tightness and pain.    OBJECTIVE IMPAIRMENTS: decreased activity tolerance, decreased ROM, increased muscle spasms, postural dysfunction, and pain.   ACTIVITY LIMITATIONS: carrying, lifting, bending, standing, and locomotion level  PARTICIPATION LIMITATIONS: meal prep, cleaning, laundry, community activity, and yard work  PERSONAL FACTORS: Time since onset of injury/illness/exacerbation and 1 comorbidity: scolosis  are also affecting patient's functional outcome.   REHAB POTENTIAL: Fair plus/good-  CLINICAL DECISION MAKING: Evolving/moderate complexity  EVALUATION COMPLEXITY: Low   GOALS:  SHORT TERM GOALS: Target date: 04/17/23.  Ind with a HEP. Goal status: INITIAL  2.  Stand 20 minutes with pain not > 4/10.  Goal status: INITIAL  3.  Walk a  community distance with pain not > 4-5/10.  Goal status: INITIAL  4.  Perform ADL's with pain not > 4-5/10.  Goal status: INITIAL  PLAN:  PT FREQUENCY: 2x/week  PT DURATION: 4 weeks  PLANNED INTERVENTIONS: 97110-Therapeutic exercises, 97530- Therapeutic activity, [id number]- Neuromuscular re-education, 97535- Self Care, [id number]- Manual therapy, 97014- Electrical stimulation (unattended), 97035- Ultrasound, Patient/Family education, Cryotherapy, and Moist heat.  PLAN FOR NEXT SESSION: Combo e'stim/US, STW/M, Core exercise progression, spinal protection techniques and body mechanics training.    Anett Ranker,CHRIS, PTA 03/26/2023, 10:37 AM

## 2023-03-29 ENCOUNTER — Ambulatory Visit: Payer: Medicare Other | Admitting: *Deleted

## 2023-03-29 ENCOUNTER — Encounter: Payer: Self-pay | Admitting: *Deleted

## 2023-03-29 DIAGNOSIS — R293 Abnormal posture: Secondary | ICD-10-CM | POA: Diagnosis not present

## 2023-03-29 DIAGNOSIS — M5459 Other low back pain: Secondary | ICD-10-CM | POA: Diagnosis not present

## 2023-03-29 NOTE — Therapy (Signed)
OUTPATIENT PHYSICAL THERAPY THORACOLUMBAR TREATMENT   Patient Name: Veronica Gomez MRN: [id number] DOB:[birth year], 67 y.o., female Today's Date: 03/29/2023  END OF SESSION:  PT End of Session - 03/29/23 0943     Visit Number 4    Number of Visits 8    Date for PT Re-Evaluation 04/17/23    PT Start Time 0930    PT Stop Time 1021    PT Time Calculation (min) 51 min              Past Medical History:  Diagnosis Date   CAD (coronary artery disease)    Cardiomyopathy (HCC)    GERD (gastroesophageal reflux disease)    Hyperlipidemia    Hypertension    Ischemia    Left bundle branch block (LBBB)    Past Surgical History:  Procedure Laterality Date   CERVICAL POLYPECTOMY  2010   FINGER SURGERY  2008   SHOULDER SURGERY Right 2014   TUBAL LIGATION  1988   Patient Active Problem List   Diagnosis Date Noted   Chronic bilateral low back pain without sciatica 02/03/2022   Combined forms of age-related cataract of left eye 10/15/2018   Regular astigmatism, left eye 10/15/2018   Chronic heart disease 10/08/2018   Gastroesophageal reflux disease 07/25/2017   Allergic rhinitis due to allergen 08/25/2016   Overweight (BMI 25.0-29.9) 09/14/2014   HTN (hypertension) 11/20/2013   Dyslipidemia 11/20/2013   Cardiomyopathy, ischemic 11/20/2013   LBBB (left bundle branch block) 11/20/2013   REFERRING PROVIDER: Malachy Chamber MD  REFERRING DIAG: Spinal instability of lumbar region.  Rationale for Evaluation and Treatment: Rehabilitation  THERAPY DIAG:  Other low back pain  Abnormal posture  ONSET DATE: Ongoing over many years.  SUBJECTIVE:                                                                                                                                                                                           SUBJECTIVE STATEMENT: The patient reports Pain at times yesterday 2/10 this AM.  Bad after dentist 6-7/10   PERTINENT HISTORY:  See above.  PAIN:   Are you having pain? Yes: NPRS scale: Moderate/10 Pain location: Across low back. Pain description: Ache, sore, throbbing and numb. Aggravating factors: As above. Relieving factors: "Relaxing and heat."  PRECAUTIONS: None  RED FLAGS: None   WEIGHT BEARING RESTRICTIONS: No  FALLS:  Has patient fallen in last 6 months? No  LIVING ENVIRONMENT: Lives with: lives with their spouse Lives in: House/apartment Has following equipment at home: None  OCCUPATION: Retired.  PLOF: Independent with basic ADLs.  She has to pace herself.  PATIENT GOALS:  Wants to get an MRI.  OBJECTIVE:  Note: Objective measures were completed at Evaluation unless otherwise noted.  DIAGNOSTIC FINDINGS:  01/24/23:  AP lateral lumbar spine images are obtained and reviewed.  Comparison to  07/12/2021 images.  Patient had progressive upper lumbar disc degeneration  with left lumbar curvature now approximately 30 degrees.  Multilevel disc  degeneration with some retrolisthesis at L1-2 L2-3 and L3-4.  Multilevel  disc narrowing.  Hip joints pelvis are unremarkable.   Impression: Progressive lumbar disc degeneration with left lumbar  curvature and multilevel disc space narrowing.      POSTURE: rounded shoulders and forward head, right iliac crest higher and scoliosis observed.  PALPATION: Patient c/o diffuse pain across her LE region.  LUMBAR ROM:   Active trunk flexion is 65 degrees and extension is 10 degrees.  LOWER EXTREMITY MMT:    Normal LE strength.  LUMBAR SPECIAL TESTS:  Equal leg lengths. (-) SLR and FABER testing.  Right Achilles reflex is absent.  GAIT: The patient's gait was remarkable for mild antalgia today.  TODAY'S TREATMENT:                                                                                                                              DATE:                                                                 03/29/23  Nustep x 10 mins L4  [place] estim combo @ 1.5 w/cm2 x  12 mins to Bil. LB paras with Pt RT side lying Manual STW x 5 mins to LB paras HMP and IFC at 80-150 Hz on 40% scan x 15 minutes to patient's bilateral lower lumbar region.    Normal modality response following removal of modality.   PATIENT EDUCATION:    HOME EXERCISE PROGRAM:   ASSESSMENT:  CLINICAL IMPRESSION: Pt reports doing okay after last Rx, but  continues to have flare-ups. Lots of increased pain sitting in the dentist chair 7-8/10.Rx focused on therex as well as decreasing pain and mm tightness  with [place] combo and STW to Bil LB paras and IFC/HMP Pt did well after session with decreased mm tightness and pain.    OBJECTIVE IMPAIRMENTS: decreased activity tolerance, decreased ROM, increased muscle spasms, postural dysfunction, and pain.   ACTIVITY LIMITATIONS: carrying, lifting, bending, standing, and locomotion level  PARTICIPATION LIMITATIONS: meal prep, cleaning, laundry, community activity, and yard work  PERSONAL FACTORS: Time since onset of injury/illness/exacerbation and 1 comorbidity: scolosis  are also affecting patient's functional outcome.   REHAB POTENTIAL: Fair plus/good-  CLINICAL DECISION MAKING: Evolving/moderate complexity  EVALUATION COMPLEXITY: Low   GOALS:  SHORT TERM GOALS: Target date: 04/17/23.  Ind with a HEP.  Goal status: On going               2.  Stand 20 minutes with pain not > 4/10.  Goal status: On going  3.  Walk a community distance with pain not > 4-5/10.  Goal status: On going  4.  Perform ADL's with pain not > 4-5/10.  Goal status: On going  PLAN:  PT FREQUENCY: 2x/week  PT DURATION: 4 weeks  PLANNED INTERVENTIONS: 97110-Therapeutic exercises, 97530- Therapeutic activity, [id number]- Neuromuscular re-education, 97535- Self Care, [id number]- Manual therapy, 97014- Electrical stimulation (unattended), 97035- Ultrasound, Patient/Family education, Cryotherapy, and Moist heat.  PLAN FOR NEXT SESSION:  Cont. Combo e'stim/US, STW/M, Core  exercise progression, spinal protection techniques and body mechanics training.    Marilynn Ekstein,CHRIS, PTA 03/29/2023, 3:01 PM

## 2023-04-02 ENCOUNTER — Encounter: Payer: Self-pay | Admitting: *Deleted

## 2023-04-02 ENCOUNTER — Ambulatory Visit: Payer: Medicare Other | Admitting: *Deleted

## 2023-04-02 DIAGNOSIS — R293 Abnormal posture: Secondary | ICD-10-CM

## 2023-04-02 DIAGNOSIS — M5459 Other low back pain: Secondary | ICD-10-CM | POA: Diagnosis not present

## 2023-04-02 NOTE — Therapy (Signed)
OUTPATIENT PHYSICAL THERAPY THORACOLUMBAR TREATMENT   Patient Name: Veronica Gomez MRN: [id number] DOB:[birth year], 67 y.o., female Today's Date: 04/02/2023  END OF SESSION:  PT End of Session - 04/02/23 0940     Visit Number 5    Number of Visits 8    Date for PT Re-Evaluation 04/17/23    PT Start Time 0930    PT Stop Time 1020    PT Time Calculation (min) 50 min              Past Medical History:  Diagnosis Date   CAD (coronary artery disease)    Cardiomyopathy (HCC)    GERD (gastroesophageal reflux disease)    Hyperlipidemia    Hypertension    Ischemia    Left bundle branch block (LBBB)    Past Surgical History:  Procedure Laterality Date   CERVICAL POLYPECTOMY  2010   FINGER SURGERY  2008   SHOULDER SURGERY Right 2014   TUBAL LIGATION  1988   Patient Active Problem List   Diagnosis Date Noted   Chronic bilateral low back pain without sciatica 02/03/2022   Combined forms of age-related cataract of left eye 10/15/2018   Regular astigmatism, left eye 10/15/2018   Chronic heart disease 10/08/2018   Gastroesophageal reflux disease 07/25/2017   Allergic rhinitis due to allergen 08/25/2016   Overweight (BMI 25.0-29.9) 09/14/2014   HTN (hypertension) 11/20/2013   Dyslipidemia 11/20/2013   Cardiomyopathy, ischemic 11/20/2013   LBBB (left bundle branch block) 11/20/2013   REFERRING PROVIDER: Malachy Chamber MD  REFERRING DIAG: Spinal instability of lumbar region.  Rationale for Evaluation and Treatment: Rehabilitation  THERAPY DIAG:  Abnormal posture  Other low back pain  ONSET DATE: Ongoing over many years.  SUBJECTIVE:                                                                                                                                                                                           SUBJECTIVE STATEMENT: The patient reports Pain at times yesterday 7/10 this AM.  Not a good day. MD Dec 1.   PERTINENT HISTORY:  See above.  PAIN:   Are you having pain? Yes: NPRS scale: 7/10 Pain location: Across low back. Pain description: Ache, sore, throbbing and numb. Aggravating factors: As above. Relieving factors: "Relaxing and heat."  PRECAUTIONS: None  RED FLAGS: None   WEIGHT BEARING RESTRICTIONS: No  FALLS:  Has patient fallen in last 6 months? No  LIVING ENVIRONMENT: Lives with: lives with their spouse Lives in: House/apartment Has following equipment at home: None  OCCUPATION: Retired.  PLOF: Independent with basic ADLs.  She has to pace herself.  PATIENT GOALS: Wants to get an MRI.  OBJECTIVE:  Note: Objective measures were completed at Evaluation unless otherwise noted.  DIAGNOSTIC FINDINGS:  01/24/23:  AP lateral lumbar spine images are obtained and reviewed.  Comparison to  07/12/2021 images.  Patient had progressive upper lumbar disc degeneration  with left lumbar curvature now approximately 30 degrees.  Multilevel disc  degeneration with some retrolisthesis at L1-2 L2-3 and L3-4.  Multilevel  disc narrowing.  Hip joints pelvis are unremarkable.   Impression: Progressive lumbar disc degeneration with left lumbar  curvature and multilevel disc space narrowing.      POSTURE: rounded shoulders and forward head, right iliac crest higher and scoliosis observed.  PALPATION: Patient c/o diffuse pain across her LE region.  LUMBAR ROM:   Active trunk flexion is 65 degrees and extension is 10 degrees.  LOWER EXTREMITY MMT:    Normal LE strength.  LUMBAR SPECIAL TESTS:  Equal leg lengths. (-) SLR and FABER testing.  Right Achilles reflex is absent.  GAIT: The patient's gait was remarkable for mild antalgia today.  TODAY'S TREATMENT:                                                                                                                              DATE:                                                                 04/02/23  Nustep x 10 mins L4  [place] estim combo @ 1.5 w/cm2 x 10 mins  to Bil. LB paras with Pt LT side lying today Manual STW x 6 mins to LB paras with Pt in LT side lying HMP and IFC at 80-150 Hz on 40% scan x 15 minutes to patient's bilateral lower lumbar region in hook lying   Normal modality response following removal of modality.   PATIENT EDUCATION:    HOME EXERCISE PROGRAM:   ASSESSMENT:  CLINICAL IMPRESSION: Pt reports not having a good day due to back pain. Rx focused again on some therex as well as decreasing pain and mm tightness  with US/ estim combo and STW to Bil LB paras and into SIJ's and IFC/HMP.Pt reports some decrease in  mm tightness and pain.    OBJECTIVE IMPAIRMENTS: decreased activity tolerance, decreased ROM, increased muscle spasms, postural dysfunction, and pain.   ACTIVITY LIMITATIONS: carrying, lifting, bending, standing, and locomotion level  PARTICIPATION LIMITATIONS: meal prep, cleaning, laundry, community activity, and yard work  PERSONAL FACTORS: Time since onset of injury/illness/exacerbation and 1 comorbidity: scolosis  are also affecting patient's functional outcome.   REHAB POTENTIAL: Fair plus/good-  CLINICAL DECISION MAKING: Evolving/moderate complexity  EVALUATION COMPLEXITY: Low   GOALS:  SHORT TERM GOALS: Target date: 04/17/23.  Ind with a HEP. Goal status: On going               2.  Stand 20 minutes with pain not > 4/10.  Goal status: On going  3.  Walk a community distance with pain not > 4-5/10.  Goal status: On going  4.  Perform ADL's with pain not > 4-5/10.  Goal status: On going  PLAN:  PT FREQUENCY: 2x/week  PT DURATION: 4 weeks  PLANNED INTERVENTIONS: 97110-Therapeutic exercises, 97530- Therapeutic activity, [id number]- Neuromuscular re-education, 97535- Self Care, 56213- Manual therapy, 97014- Electrical stimulation (unattended), 97035- Ultrasound, Patient/Family education, Cryotherapy, and Moist heat.  PLAN FOR NEXT SESSION:  Cont. Combo e'stim/US, STW/M, Core exercise progression.  To MD after 2 more visits    Cierria Height,CHRIS, PTA 04/02/2023, 12:11 PM

## 2023-04-04 ENCOUNTER — Ambulatory Visit: Payer: Medicare Other

## 2023-04-04 DIAGNOSIS — R293 Abnormal posture: Secondary | ICD-10-CM | POA: Diagnosis not present

## 2023-04-04 DIAGNOSIS — M5459 Other low back pain: Secondary | ICD-10-CM

## 2023-04-04 NOTE — Therapy (Signed)
OUTPATIENT PHYSICAL THERAPY THORACOLUMBAR TREATMENT   Patient Name: Veronica Gomez MRN: [id number] DOB:1956/01/26, 67 y.o., female Today's Date: 04/04/2023  END OF SESSION:  PT End of Session - 04/04/23 0934     Visit Number 6    Number of Visits 8    Date for PT Re-Evaluation 04/17/23    PT Start Time 0930    PT Stop Time 1031    PT Time Calculation (min) 61 min              Past Medical History:  Diagnosis Date   CAD (coronary artery disease)    Cardiomyopathy (HCC)    GERD (gastroesophageal reflux disease)    Hyperlipidemia    Hypertension    Ischemia    Left bundle branch block (LBBB)    Past Surgical History:  Procedure Laterality Date   CERVICAL POLYPECTOMY  2010   FINGER SURGERY  2008   SHOULDER SURGERY Right 2014   TUBAL LIGATION  1988   Patient Active Problem List   Diagnosis Date Noted   Chronic bilateral low back pain without sciatica 02/03/2022   Combined forms of age-related cataract of left eye 10/15/2018   Regular astigmatism, left eye 10/15/2018   Chronic heart disease 10/08/2018   Gastroesophageal reflux disease 07/25/2017   Allergic rhinitis due to allergen 08/25/2016   Overweight (BMI 25.0-29.9) 09/14/2014   HTN (hypertension) 11/20/2013   Dyslipidemia 11/20/2013   Cardiomyopathy, ischemic 11/20/2013   LBBB (left bundle branch block) 11/20/2013   REFERRING PROVIDER: Malachy Chamber MD  REFERRING DIAG: Spinal instability of lumbar region.  Rationale for Evaluation and Treatment: Rehabilitation  THERAPY DIAG:  Abnormal posture  Other low back pain  ONSET DATE: Ongoing over many years.  SUBJECTIVE:                                                                                                                                                                                           SUBJECTIVE STATEMENT: Pt reports 8/10 low back pain today.  Pt reports that back feels better for a short time after therapy, but then returns.     PERTINENT HISTORY:  See above.  PAIN:  Are you having pain? Yes: NPRS scale: 8/10 Pain location: Across low back. Pain description: Ache, sore, throbbing and numb. Aggravating factors: As above. Relieving factors: "Relaxing and heat."  PRECAUTIONS: None  RED FLAGS: None   WEIGHT BEARING RESTRICTIONS: No  FALLS:  Has patient fallen in last 6 months? No  LIVING ENVIRONMENT: Lives with: lives with their spouse Lives in: House/apartment Has following equipment at home: None  OCCUPATION: Retired.  PLOF: Independent with basic ADLs.  She has to pace herself.  PATIENT GOALS: Wants to get an MRI.  OBJECTIVE:  Note: Objective measures were completed at Evaluation unless otherwise noted.  DIAGNOSTIC FINDINGS:  01/24/23:  AP lateral lumbar spine images are obtained and reviewed.  Comparison to  07/12/2021 images.  Patient had progressive upper lumbar disc degeneration  with left lumbar curvature now approximately 30 degrees.  Multilevel disc  degeneration with some retrolisthesis at L1-2 L2-3 and L3-4.  Multilevel  disc narrowing.  Hip joints pelvis are unremarkable.   Impression: Progressive lumbar disc degeneration with left lumbar  curvature and multilevel disc space narrowing.      POSTURE: rounded shoulders and forward head, right iliac crest higher and scoliosis observed.  PALPATION: Patient c/o diffuse pain across her LE region.  LUMBAR ROM:   Active trunk flexion is 65 degrees and extension is 10 degrees.  LOWER EXTREMITY MMT:    Normal LE strength.  LUMBAR SPECIAL TESTS:  Equal leg lengths. (-) SLR and FABER testing.  Right Achilles reflex is absent.  GAIT: The patient's gait was remarkable for mild antalgia today.  TODAY'S TREATMENT:                                                                                                                              DATE:                                                                                      04/04/23                EXERCISE LOG  Exercise Repetitions and Resistance Comments  Nustep  Lvl 3 x 15 mins        Blank cell = exercise not performed today   Modalities  Date:  Unattended Estim: Lumbar, IFC 80-150 Hz, 15 mins, Pain Combo: Lumbar, 1.5 w/cm2 100%, 16 mins, Pain Hot Pack: Lumbar, 15 mins, Pain and Tone  Manual Therapy Soft Tissue Mobilization: Lumbar, STW/M to lumbar paraspinals to decrease pain and tone     04/02/23  Nustep x 10 mins L4  [place] estim combo @ 1.5 w/cm2 x 10 mins to Bil. LB paras with Pt LT side lying today Manual STW x 6 mins to LB paras with Pt in LT side lying HMP and IFC at 80-150 Hz on 40% scan x 15 minutes to patient's bilateral lower lumbar region in hook lying   Normal modality response following removal of modality.   PATIENT EDUCATION:    HOME EXERCISE PROGRAM:   ASSESSMENT:  CLINICAL IMPRESSION: Pt arrives for today's treatment session reporting 8/10 low back pain.  Pt  able to tolerate increased time on Nustep today with minimal fatigue and complaint of pain.  Pt declined performance of other exercises.  Normal responses to all modalities performed today noted on completion.  STW/M performed to lumbar musculature to decrease pain and tone.  Pt reported mild decrease in low back pain at completion of today's treatment session.   OBJECTIVE IMPAIRMENTS: decreased activity tolerance, decreased ROM, increased muscle spasms, postural dysfunction, and pain.   ACTIVITY LIMITATIONS: carrying, lifting, bending, standing, and locomotion level  PARTICIPATION LIMITATIONS: meal prep, cleaning, laundry, community activity, and yard work  PERSONAL FACTORS: Time since onset of injury/illness/exacerbation and 1 comorbidity: scolosis  are also affecting patient's functional outcome.   REHAB POTENTIAL: Fair plus/good-  CLINICAL DECISION MAKING: Evolving/moderate complexity  EVALUATION COMPLEXITY: Low   GOALS:  SHORT TERM GOALS: Target date:  04/17/23.  Ind with a HEP. Goal status: MET        2.  Stand 20 minutes with pain not > 4/10.  Goal status: On going  3.  Walk a community distance with pain not > 4-5/10. 11/20: Depends on the day  Goal status: On going  4.  Perform ADL's with pain not > 4-5/10.  Goal status: On going  PLAN:  PT FREQUENCY: 2x/week  PT DURATION: 4 weeks  PLANNED INTERVENTIONS: 97110-Therapeutic exercises, 97530- Therapeutic activity, [id number]- Neuromuscular re-education, 97535- Self Care, 16109- Manual therapy, 97014- Electrical stimulation (unattended), 97035- Ultrasound, Patient/Family education, Cryotherapy, and Moist heat.  PLAN FOR NEXT SESSION:  Cont. Combo e'stim/US, STW/M, Core exercise progression. To MD after 2 more visits    Newman Pies, PTA 04/04/2023, 11:43 AM

## 2023-04-07 ENCOUNTER — Other Ambulatory Visit: Payer: Self-pay | Admitting: Family

## 2023-04-10 ENCOUNTER — Ambulatory Visit: Payer: Medicare Other | Admitting: Physical Therapy

## 2023-04-10 DIAGNOSIS — R293 Abnormal posture: Secondary | ICD-10-CM

## 2023-04-10 DIAGNOSIS — M5459 Other low back pain: Secondary | ICD-10-CM

## 2023-04-10 NOTE — Therapy (Signed)
OUTPATIENT PHYSICAL THERAPY THORACOLUMBAR TREATMENT   Patient Name: Veronica Gomez MRN: [id number] DOB:11/25/1955, 67 y.o., female Today's Date: 04/10/2023  END OF SESSION:  PT End of Session - 04/10/23 0943     Visit Number 7    Number of Visits 8    Date for PT Re-Evaluation 04/17/23    PT Start Time 0930    PT Stop Time 1024    PT Time Calculation (min) 54 min    Activity Tolerance Patient tolerated treatment well    Behavior During Therapy [facility] for tasks assessed/performed              Past Medical History:  Diagnosis Date   CAD (coronary artery disease)    Cardiomyopathy (HCC)    GERD (gastroesophageal reflux disease)    Hyperlipidemia    Hypertension    Ischemia    Left bundle branch block (LBBB)    Past Surgical History:  Procedure Laterality Date   CERVICAL POLYPECTOMY  2010   FINGER SURGERY  2008   SHOULDER SURGERY Right 2014   TUBAL LIGATION  1988   Patient Active Problem List   Diagnosis Date Noted   Chronic bilateral low back pain without sciatica 02/03/2022   Combined forms of age-related cataract of left eye 10/15/2018   Regular astigmatism, left eye 10/15/2018   Chronic heart disease 10/08/2018   Gastroesophageal reflux disease 07/25/2017   Allergic rhinitis due to allergen 08/25/2016   Overweight (BMI 25.0-29.9) 09/14/2014   HTN (hypertension) 11/20/2013   Dyslipidemia 11/20/2013   Cardiomyopathy, ischemic 11/20/2013   LBBB (left bundle branch block) 11/20/2013   REFERRING PROVIDER: Malachy Chamber MD  REFERRING DIAG: Spinal instability of lumbar region.  Rationale for Evaluation and Treatment: Rehabilitation  THERAPY DIAG:  Abnormal posture  Other low back pain  ONSET DATE: Ongoing over many years.  SUBJECTIVE:                                                                                                                                                                                           SUBJECTIVE STATEMENT: About the  same.  PERTINENT HISTORY:  See above.  PAIN:  Are you having pain? Yes: NPRS scale: 8/10 Pain location: Across low back. Pain description: Ache, sore, throbbing and numb. Aggravating factors: As above. Relieving factors: "Relaxing and heat."  PRECAUTIONS: None  RED FLAGS: None   WEIGHT BEARING RESTRICTIONS: No  FALLS:  Has patient fallen in last 6 months? No  LIVING ENVIRONMENT: Lives with: lives with their spouse Lives in: House/apartment Has following equipment at home: None  OCCUPATION: Retired.  PLOF: Independent with basic ADLs.  She has  to pace herself.  PATIENT GOALS: Wants to get an MRI.  OBJECTIVE:  Note: Objective measures were completed at Evaluation unless otherwise noted.  DIAGNOSTIC FINDINGS:  01/24/23:  AP lateral lumbar spine images are obtained and reviewed.  Comparison to  07/12/2021 images.  Patient had progressive upper lumbar disc degeneration  with left lumbar curvature now approximately 30 degrees.  Multilevel disc  degeneration with some retrolisthesis at L1-2 L2-3 and L3-4.  Multilevel  disc narrowing.  Hip joints pelvis are unremarkable.   Impression: Progressive lumbar disc degeneration with left lumbar  curvature and multilevel disc space narrowing.      POSTURE: rounded shoulders and forward head, right iliac crest higher and scoliosis observed.  PALPATION: Patient c/o diffuse pain across her LE region.  LUMBAR ROM:   Active trunk flexion is 65 degrees and extension is 10 degrees.  LOWER EXTREMITY MMT:    Normal LE strength.  LUMBAR SPECIAL TESTS:  Equal leg lengths. (-) SLR and FABER testing.  Right Achilles reflex is absent.  GAIT: The patient's gait was remarkable for mild antalgia today.  TODAY'S TREATMENT:                                                                                                                              DATE:     04/10/23.                                     EXERCISE LOG  Exercise  Repetitions and Resistance Comments  Nustep Level x 15 minutes.                      Patient in left SDLY position with folded pillow between  knees for comfort:  STW/M x 8 minutes over patient bilateral lumbar region f/b  HMP and IFC at 80-150 Hz on 40% scan x 20 minutes.    Normal modality response following removal of modality.                       PATIENT EDUCATION:    HOME EXERCISE PROGRAM:   ASSESSMENT:  CLINICAL IMPRESSION: Patient arrives for her last visit with pain remaining high.  Discharge today. Please see below.     OBJECTIVE IMPAIRMENTS: decreased activity tolerance, decreased ROM, increased muscle spasms, postural dysfunction, and pain.   ACTIVITY LIMITATIONS: carrying, lifting, bending, standing, and locomotion level  PARTICIPATION LIMITATIONS: meal prep, cleaning, laundry, community activity, and yard work  PERSONAL FACTORS: Time since onset of injury/illness/exacerbation and 1 comorbidity: scolosis  are also affecting patient's functional outcome.   REHAB POTENTIAL: Fair plus/good-  CLINICAL DECISION MAKING: Evolving/moderate complexity  EVALUATION COMPLEXITY: Low   GOALS:  SHORT TERM GOALS: Target date: 04/17/23.  Ind with a HEP. Goal status: MET        2.  Stand 20 minutes with pain  not > 4/10.  Goal status: NM 3.  Walk a community distance with pain not > 4-5/10. 11/20: Depends on the day  Goal status: NM 4.  Perform ADL's with pain not > 4-5/10.  Goal status: NM  PLAN:  PT FREQUENCY: 2x/week  PT DURATION: 4 weeks  PLANNED INTERVENTIONS: 97110-Therapeutic exercises, 97530- Therapeutic activity, [id number]- Neuromuscular re-education, 97535- Self Care, 16109- Manual therapy, 97014- Electrical stimulation (unattended), 97035- Ultrasound, Patient/Family education, Cryotherapy, and Moist heat.  PLAN FOR NEXT SESSION:  Cont. Combo e'stim/US, STW/M, Core exercise progression. To MD after 2 more visits   PHYSICAL THERAPY DISCHARGE  SUMMARY  Visits from Start of Care: 7.  Current functional level related to goals / functional outcomes: Continued high pain-levels.   Remaining deficits: Goals #2, 3 and 4 unmet.   Education / Equipment: HEP.   Patient agrees to discharge. Patient goals were partially met. Patient is being discharged due to lack of progress.    Leonell Lobdell, [place], PT 04/10/2023, 10:48 AM

## 2023-04-17 ENCOUNTER — Other Ambulatory Visit: Payer: Self-pay | Admitting: Family

## 2023-04-17 NOTE — Telephone Encounter (Signed)
Copied from CRM [id number]. Topic: Clinical - Medication Refill >> Apr 17, 2023  8:43 AM Deaijah H wrote: Most Recent Primary Care Visit:  Provider: Jannifer Rodney A  Department: [facility]  Visit Type: OFFICE VISIT  Date: 12/26/2022  Medication: hydrochlorothiazide (HYDRODIURIL) 25 MG tablet  Has the patient contacted their pharmacy? Yes (Agent: If no, request that the patient contact the pharmacy for the refill. If patient does not wish to contact the pharmacy document the reason why and proceed with request.) (Agent: If yes, when and what did the pharmacy advise?) Last week   Is this the correct pharmacy for this prescription? Yes If no, delete pharmacy and type the correct one.  This is the patient's preferred pharmacy:  [id number] - [place], [place] - [address] [place] [address] Phone: [phone] Fax: [phone]  Has the prescription been filled recently? No  Is the patient out of the medication? No  Has the patient been seen for an appointment in the last year OR does the patient have an upcoming appointment? Yes  Can we respond through MyChart? Yes  Agent: Please be advised that Rx refills may take up to 3 business days. We ask that you follow-up with your pharmacy.

## 2023-04-20 DIAGNOSIS — M41126 Adolescent idiopathic scoliosis, lumbar region: Secondary | ICD-10-CM | POA: Diagnosis not present

## 2023-04-20 DIAGNOSIS — M532X6 Spinal instabilities, lumbar region: Secondary | ICD-10-CM | POA: Diagnosis not present

## 2023-04-20 DIAGNOSIS — M4156 Other secondary scoliosis, lumbar region: Secondary | ICD-10-CM | POA: Diagnosis not present

## 2023-04-24 ENCOUNTER — Other Ambulatory Visit: Payer: Self-pay | Admitting: Orthopedic Surgery

## 2023-04-24 DIAGNOSIS — M532X6 Spinal instabilities, lumbar region: Secondary | ICD-10-CM

## 2023-04-24 DIAGNOSIS — M4156 Other secondary scoliosis, lumbar region: Secondary | ICD-10-CM

## 2023-04-24 DIAGNOSIS — M41126 Adolescent idiopathic scoliosis, lumbar region: Secondary | ICD-10-CM

## 2023-05-04 ENCOUNTER — Ambulatory Visit (INDEPENDENT_AMBULATORY_CARE_PROVIDER_SITE_OTHER): Payer: Medicare Other

## 2023-05-04 VITALS — Ht 63.0 in | Wt 174.0 lb

## 2023-05-04 DIAGNOSIS — Z0000 Encounter for general adult medical examination without abnormal findings: Secondary | ICD-10-CM

## 2023-05-04 NOTE — Patient Instructions (Signed)
Veronica Gomez , Thank you for taking time to come for your Medicare Wellness Visit. I appreciate your ongoing commitment to your health goals. Please review the following plan we discussed and let me know if I can assist you in the future.   Referrals/Orders/Follow-Ups/Clinician Recommendations: Aim for 30 minutes of exercise or brisk walking, 6-8 glasses of water, and 5 servings of fruits and vegetables each day.  This is a list of the screening recommended for you and due dates:  Health Maintenance  Topic Date Due   Cologuard (Stool DNA test)  Never done   DEXA scan (bone density measurement)  10/03/2021   COVID-19 Vaccine (3 - 2024-25 season) 01/14/2023   Medicare Annual Wellness Visit  05/03/2024   Mammogram  01/30/2025   DTaP/Tdap/Td vaccine (3 - Td or Tdap) 07/26/2027   Pneumonia Vaccine  Completed   Flu Shot  Completed   Hepatitis C Screening  Completed   Zoster (Shingles) Vaccine  Completed   HPV Vaccine  Aged Out   Colon Cancer Screening  Discontinued    Advanced directives: (ACP Link)Information on Advanced Care Planning can be found at [facility] Advance Health Care Directives Advance Health Care Directives ([email or web address]   Next Medicare Annual Wellness Visit scheduled for next year: Yes

## 2023-05-04 NOTE — Progress Notes (Signed)
Subjective:   Veronica Gomez is a 67 y.o. female who presents for Medicare Annual (Subsequent) preventive examination.  Visit Complete: Virtual I connected with  Veronica Gomez on 05/04/23 by a audio enabled telemedicine application and verified that I am speaking with the correct person using two identifiers.  Patient Location: Home  Provider Location: Home Office  I discussed the limitations of evaluation and management by telemedicine. The patient expressed understanding and agreed to proceed.  Vital Signs: Because this visit was a virtual/telehealth visit, some criteria may be missing or patient reported. Any vitals not documented were not able to be obtained and vitals that have been documented are patient reported.  Cardiac Risk Factors include: advanced age (>27men, >49 women);hypertension;dyslipidemia;sedentary lifestyle     Objective:    Today's Vitals   05/04/23 1411  Weight: 174 lb (78.9 kg)  Height: 5\' 3"  (1.6 m)   Body mass index is 30.82 kg/m.     05/04/2023    2:19 PM 03/20/2023   10:05 AM 04/26/2022    1:51 PM 08/09/2020    8:57 AM  Advanced Directives  Does Patient Have a Medical Advance Directive? No Yes Yes Yes  Type of Advance Directive   Living will;Healthcare Power of [employer] Power of [place];Living will  Does patient want to make changes to medical advance directive?   No - Patient declined No - Patient declined  Copy of Healthcare Power of Attorney in Chart?   No - copy requested   Would patient like information on creating a medical advance directive? Yes (MAU/Ambulatory/Procedural Areas - Information given)       Current Medications (verified) Outpatient Encounter Medications as of 05/04/2023  Medication Sig   Ascorbic Acid (VITAMIN C PO) Vitamin C Chewable 500 mg tablet   1 tablet every day by oral route.   aspirin 81 MG tablet Take 81 mg by mouth daily.   atorvastatin (LIPITOR) 40 MG tablet TAKE 1 TABLET BY MOUTH DAILY   baclofen  (LIORESAL) 10 MG tablet TAKE 1 TABLET BY MOUTH THREE TIMES A DAY   carvedilol (COREG) 6.25 MG tablet TAKE 1 TABLET BY MOUTH TWICE  DAILY WITH A MEAL   Cholecalciferol (VITAMIN D3) 25 MCG (1000 UT) CAPS Take by mouth daily.   cyanocobalamin (VITAMIN B12) 1000 MCG tablet Take 3,000 mcg by mouth daily.   diclofenac (VOLTAREN) 75 MG EC tablet TAKE 1 TABLET BY MOUTH TWICE  DAILY   diclofenac Sodium (VOLTAREN) 1 % GEL Apply 2 g topically 4 (four) times daily.   hydrochlorothiazide (HYDRODIURIL) 25 MG tablet TAKE 1 TABLET (25 MG TOTAL) BY MOUTH DAILY.   Melatonin-Pyridoxine (MELATIN PO) Take by mouth.   omeprazole (PRILOSEC) 20 MG capsule TAKE 1 CAPSULE BY MOUTH DAILY   sacubitril-valsartan (ENTRESTO) 97-103 MG Take 1 tablet by mouth 2 (two) times daily.   terbinafine (LAMISIL) 250 MG tablet Take 1 tablet (250 mg total) by mouth daily.   No facility-administered encounter medications on file as of 05/04/2023.    Allergies (verified) Farxiga [dapagliflozin] and Jardiance [empagliflozin]   History: Past Medical History:  Diagnosis Date   CAD (coronary artery disease)    Cardiomyopathy (HCC)    GERD (gastroesophageal reflux disease)    Hyperlipidemia    Hypertension    Ischemia    Left bundle branch block (LBBB)    Past Surgical History:  Procedure Laterality Date   CERVICAL POLYPECTOMY  2010   FINGER SURGERY  2008   SHOULDER SURGERY Right 2014   TUBAL  LIGATION  1988   Family History  Problem Relation Age of Onset   Osteoporosis Mother    Asthma Mother    Hypertension Mother    Hypertension Father    Heart disease Father        MI   Social History   Socioeconomic History   Marital status: Married    Spouse name: Not on file   Number of children: Not on file   Years of education: Not on file   Highest education level: Not on file  Occupational History   Not on file  Tobacco Use   Smoking status: Former    Current packs/day: 0.00    Types: Cigarettes    Quit date:  05/15/1988    Years since quitting: 34.9   Smokeless tobacco: Never  Vaping Use   Vaping status: Never Used  Substance and Sexual Activity   Alcohol use: Yes    Comment: occasionally - beer   Drug use: No   Sexual activity: Yes    Birth control/protection: Post-menopausal  Other Topics Concern   Not on file  Social History Narrative   Not on file   Social Drivers of Health   Financial Resource Strain: Low Risk  (05/04/2023)   Overall Financial Resource Strain (CARDIA)    Difficulty of Paying Living Expenses: Not hard at all  Food Insecurity: No Food Insecurity (05/04/2023)   Hunger Vital Sign    Worried About Running Out of Food in the Last Year: Never true    Ran Out of Food in the Last Year: Never true  Transportation Needs: No Transportation Needs (05/04/2023)   PRAPARE - Administrator, Civil Service (Medical): No    Lack of Transportation (Non-Medical): No  Physical Activity: Inactive (05/04/2023)   Exercise Vital Sign    Days of Exercise per Week: 0 days    Minutes of Exercise per Session: 0 min  Stress: No Stress Concern Present (05/04/2023)   [employer] of Occupational Health - Occupational Stress Questionnaire    Feeling of Stress : Not at all  Social Connections: Socially Integrated (05/04/2023)   Social Connection and Isolation Panel [NHANES]    Frequency of Communication with Friends and Family: More than three times a week    Frequency of Social Gatherings with Friends and Family: Three times a week    Attends Religious Services: More than 4 times per year    Active Member of Clubs or Organizations: Yes    Attends Engineer, structural: More than 4 times per year    Marital Status: Married    Tobacco Counseling Counseling given: Not Answered   Clinical Intake:  Pre-visit preparation completed: Yes  Pain : No/denies pain     Diabetes: No  How often do you need to have someone help you when you read instructions,  pamphlets, or other written materials from your doctor or pharmacy?: 1 - Never  Interpreter Needed?: No  Information entered by :: Kandis Fantasia LPN   Activities of Daily Living    05/04/2023    2:18 PM  In your present state of health, do you have any difficulty performing the following activities:  Hearing? 0  Vision? 0  Difficulty concentrating or making decisions? 0  Walking or climbing stairs? 0  Dressing or bathing? 0  Doing errands, shopping? 0  Preparing Food and eating ? N  Using the Toilet? N  In the past six months, have you accidently leaked urine? N  Do  you have problems with loss of bowel control? N  Managing your Medications? N  Managing your Finances? N  Housekeeping or managing your Housekeeping? N    Patient Care Team: Junie Spencer, FNP as PCP - General (Family Medicine) Rennis Golden Lisette Abu, MD as PCP - Cardiology (Cardiology) [facility], [facility] [place], [place] as Consulting Physician (Obstetrics and Gynecology) [facility] Doctor as Consulting Physician  Indicate any recent Medical Services you may have received from other than [facility] providers in the past year (date may be approximate).     Assessment:   This is a routine wellness examination for [place].  Hearing/Vision screen Hearing Screening - Comments:: Denies hearing difficulties   Vision Screening - Comments:: No vision problems; will schedule routine eye exam soon     Goals Addressed             This Visit's Progress    Remain active and independent        Depression Screen    05/04/2023    2:17 PM 12/26/2022    9:33 AM 04/26/2022    1:45 PM 01/05/2021    8:39 AM 08/09/2020    8:54 AM 07/08/2020    8:48 AM 10/06/2019   12:18 PM  PHQ 2/9 Scores  PHQ - 2 Score 0 0 0 0 0 0 0  PHQ- 9 Score  0         Fall Risk    05/04/2023    2:18 PM 12/26/2022    9:33 AM 04/26/2022    1:44 PM 06/20/2021   10:48 AM 01/05/2021    8:39 AM  Fall Risk   Falls in the past year? 0 0  0 1 0  Number falls in past yr: 0 0 0 0   Injury with Fall? 0 0 0 0   Risk for fall due to : No Fall Risks No Fall Risks No Fall Risks History of fall(s)   Follow up Falls prevention discussed;Education provided;Falls evaluation completed Falls evaluation completed Falls evaluation completed;Education provided;Falls prevention discussed Education provided     MEDICARE RISK AT HOME: Medicare Risk at Home Any stairs in or around the home?: No If so, are there any without handrails?: No Home free of loose throw rugs in walkways, pet beds, electrical cords, etc?: Yes Adequate lighting in your home to reduce risk of falls?: Yes Life alert?: No Use of a cane, walker or w/c?: No Grab bars in the bathroom?: Yes Shower chair or bench in shower?: No Elevated toilet seat or a handicapped toilet?: Yes  TIMED UP AND GO:  Was the test performed?  No    Cognitive Function:    02/13/2019    2:48 PM  MMSE - Mini Mental State Exam  Orientation to time 5  Orientation to Place 5  Registration 3  Attention/ Calculation 5  Recall 3  Language- name 2 objects 2  Language- repeat 1  Language- follow 3 step command 3  Language- read & follow direction 1  Write a sentence 1  Copy design 1  Total score 30        05/04/2023    2:18 PM 04/26/2022    1:52 PM 08/09/2020    8:59 AM  6CIT Screen  What Year? 0 points 0 points 0 points  What month? 0 points 0 points 0 points  What time? 0 points 0 points 0 points  Count back from 20 0 points 0 points 0 points  Months in reverse 0  points 0 points 0 points  Repeat phrase 0 points 0 points 0 points  Total Score 0 points 0 points 0 points    Immunizations Immunization History  Administered Date(s) Administered   Fluad Quad(high Dose 65+) 02/13/2019, 02/03/2022   Influenza Inj Mdck Quad With Preservative 02/25/2018   Influenza, High Dose Seasonal PF 02/24/2023   Influenza,inj,Quad PF,6+ Mos 01/24/2015, 04/03/2016   Influenza,inj,quad, With  Preservative 02/24/2017   Influenza-Unspecified 03/23/2014, 02/25/2018   Janssen (J&J) SARS-COV-2 Vaccination 09/15/2019   Pneumococcal Conjugate-13 01/09/2019   Pneumococcal Polysaccharide-23 07/08/2020   Pneumococcal-Unspecified 07/07/2020   Td 07/25/2017   Tdap 07/25/2017   Unspecified SARS-COV-2 Vaccination 09/15/2019   Zoster Recombinant(Shingrix) 06/27/2022, 12/26/2022   Zoster, Live 03/01/2013, 03/27/2013    TDAP status: Up to date  Flu Vaccine status: Up to date  Pneumococcal vaccine status: Up to date  Covid-19 vaccine status: Information provided on how to obtain vaccines.   Qualifies for Shingles Vaccine? Yes   Zostavax completed Yes   Shingrix Completed?: Yes  Screening Tests Health Maintenance  Topic Date Due   Fecal DNA (Cologuard)  Never done   DEXA SCAN  10/03/2021   COVID-19 Vaccine (3 - 2024-25 season) 01/14/2023   Medicare Annual Wellness (AWV)  05/03/2024   MAMMOGRAM  01/30/2025   DTaP/Tdap/Td (3 - Td or Tdap) 07/26/2027   Pneumonia Vaccine 9+ Years old  Completed   INFLUENZA VACCINE  Completed   Hepatitis C Screening  Completed   Zoster Vaccines- Shingrix  Completed   HPV VACCINES  Aged Out   Colonoscopy  Discontinued    Health Maintenance  Health Maintenance Due  Topic Date Due   Fecal DNA (Cologuard)  Never done   DEXA SCAN  10/03/2021   COVID-19 Vaccine (3 - 2024-25 season) 01/14/2023    Colorectal cancer screening: Type of screening: Cologuard. Completed 02/13/19. Repeat every 3 years  Mammogram status: Completed 01/31/23. Repeat every year  Bone Density status:  Will request records from gyn   Lung Cancer Screening: (Low Dose CT Chest recommended if Age 1-80 years, 20 pack-year currently smoking OR have quit w/in 15years.) does not qualify.   Lung Cancer Screening Referral: n/a  Additional Screening:  Hepatitis C Screening: does qualify; Completed 07/25/17  Vision Screening: Recommended annual ophthalmology exams for early  detection of glaucoma and other disorders of the eye. Is the patient up to date with their annual eye exam?  No  Who is the provider or what is the name of the office in which the patient attends annual eye exams? none If pt is not established with a provider, would they like to be referred to a provider to establish care? No .   Dental Screening: Recommended annual dental exams for proper oral hygiene  Community Resource Referral / Chronic Care Management: CRR required this visit?  No   CCM required this visit?  No     Plan:     I have personally reviewed and noted the following in the patient's chart:   Medical and social history Use of alcohol, tobacco or illicit drugs  Current medications and supplements including opioid prescriptions. Patient is not currently taking opioid prescriptions. Functional ability and status Nutritional status Physical activity Advanced directives List of other physicians Hospitalizations, surgeries, and ER visits in previous 12 months Vitals Screenings to include cognitive, depression, and falls Referrals and appointments  In addition, I have reviewed and discussed with patient certain preventive protocols, quality metrics, and best practice recommendations. A written personalized care plan  for preventive services as well as general preventive health recommendations were provided to patient.     Kandis Fantasia [place], [place]   [id number]   After Visit Summary: (MyChart) Due to this being a telephonic visit, the after visit summary with patients personalized plan was offered to patient via MyChart   Nurse Notes: No concerns at this time

## 2023-05-12 ENCOUNTER — Ambulatory Visit
Admission: RE | Admit: 2023-05-12 | Discharge: 2023-05-12 | Disposition: A | Discharge status: Home / Self Care | Payer: Medicare Other | Source: Ambulatory Visit | Attending: Orthopedic Surgery | Admitting: Orthopedic Surgery

## 2023-05-12 DIAGNOSIS — M41126 Adolescent idiopathic scoliosis, lumbar region: Secondary | ICD-10-CM

## 2023-05-12 DIAGNOSIS — M4156 Other secondary scoliosis, lumbar region: Secondary | ICD-10-CM

## 2023-05-12 DIAGNOSIS — M532X6 Spinal instabilities, lumbar region: Secondary | ICD-10-CM

## 2023-05-12 DIAGNOSIS — M4726 Other spondylosis with radiculopathy, lumbar region: Secondary | ICD-10-CM | POA: Diagnosis not present

## 2023-05-23 ENCOUNTER — Encounter: Payer: Self-pay | Admitting: Family

## 2023-05-25 ENCOUNTER — Telehealth: Payer: Self-pay | Admitting: Internal Medicine

## 2023-05-25 DIAGNOSIS — M4156 Other secondary scoliosis, lumbar region: Secondary | ICD-10-CM | POA: Diagnosis not present

## 2023-05-25 DIAGNOSIS — M41126 Adolescent idiopathic scoliosis, lumbar region: Secondary | ICD-10-CM | POA: Diagnosis not present

## 2023-05-25 DIAGNOSIS — M532X6 Spinal instabilities, lumbar region: Secondary | ICD-10-CM | POA: Diagnosis not present

## 2023-05-25 NOTE — Telephone Encounter (Deleted)
 Paper Work Dropped Off: Surgical Clearance for Spine and Scoliosis   Date:05/25/2023  Location of paper:  Faxed to [phone] and placed in clearance folder .

## 2023-05-25 NOTE — Telephone Encounter (Signed)
   Patient Name: Veronica Gomez  DOB: 09/14/1955 MRN: [id number]  Primary Cardiologist: Vinie JAYSON Maxcy, MD  Chart reviewed as part of pre-operative protocol coverage. Pre-op clearance already addressed by colleagues in earlier phone notes. To summarize recommendations:  -She is having some issues with her spine and may require upcoming surgery. From my standpoint I think she would be at acceptable risk for this without further testing if she were to go to surgery in the next 6 months.  -Dr. Maxcy Maine to hold ASA x 5-7 days prior to surgery and resume when medically safe to do so.  Will route this bundled recommendation to requesting provider via Epic fax function and remove from pre-op pool. Please call with questions.  Orren LOISE Fabry, PA-C 05/25/2023, 2:35 PM

## 2023-05-25 NOTE — Telephone Encounter (Signed)
 Pt has been advised of clearance and ASA med hold with verbal understanding. Notes have been faxed to surgeon office.

## 2023-05-25 NOTE — Telephone Encounter (Signed)
 Paper Work Dropped Off: Surgical Clearance for Spine and Scoliosis    Date:05/25/2023   Location of paper:  Faxed to [phone] and placed in clearance folder .  Also placed in Dr. Rennis Golden Mailbox

## 2023-05-25 NOTE — Telephone Encounter (Signed)
   Pre-operative Risk Assessment    Patient Name: Veronica Gomez  DOB: 10-17-1955 MRN: [id number]   Date of last office visit: 03/19/23 DR. HILTY Date of next office visit: NONE   Request for Surgical Clearance    Procedure:   T10-S1 FUSION  Date of Surgery:  Clearance TBD                                Surgeon:  DR. DONNAJEAN EDDY Surgeon's Group or Practice Name:  [facility] Phone number:  [phone] Fax number:  [phone]   Type of Clearance Requested:   - Medical  - Pharmacy:  Hold Aspirin x 7 DAYS PRIOR   Type of Anesthesia:  General    Additional requests/questions:    Bonney Niels Jest   05/25/2023, 12:44 PM

## 2023-05-29 ENCOUNTER — Other Ambulatory Visit: Payer: Self-pay | Admitting: Orthopedic Surgery

## 2023-05-29 ENCOUNTER — Encounter: Payer: Self-pay | Admitting: Orthopedic Surgery

## 2023-05-29 DIAGNOSIS — M4156 Other secondary scoliosis, lumbar region: Secondary | ICD-10-CM

## 2023-05-29 DIAGNOSIS — M532X6 Spinal instabilities, lumbar region: Secondary | ICD-10-CM

## 2023-05-29 DIAGNOSIS — M41126 Adolescent idiopathic scoliosis, lumbar region: Secondary | ICD-10-CM

## 2023-05-31 ENCOUNTER — Ambulatory Visit
Admission: RE | Admit: 2023-05-31 | Discharge: 2023-05-31 | Disposition: A | Discharge status: Home / Self Care | Payer: Medicare Other | Source: Ambulatory Visit | Attending: Orthopedic Surgery | Admitting: Orthopedic Surgery

## 2023-05-31 DIAGNOSIS — M4156 Other secondary scoliosis, lumbar region: Secondary | ICD-10-CM

## 2023-05-31 DIAGNOSIS — M532X6 Spinal instabilities, lumbar region: Secondary | ICD-10-CM

## 2023-05-31 DIAGNOSIS — M41126 Adolescent idiopathic scoliosis, lumbar region: Secondary | ICD-10-CM

## 2023-05-31 DIAGNOSIS — M546 Pain in thoracic spine: Secondary | ICD-10-CM | POA: Diagnosis not present

## 2023-05-31 DIAGNOSIS — M545 Low back pain, unspecified: Secondary | ICD-10-CM | POA: Diagnosis not present

## 2023-06-07 ENCOUNTER — Other Ambulatory Visit: Payer: Self-pay | Admitting: Family

## 2023-06-07 DIAGNOSIS — G8929 Other chronic pain: Secondary | ICD-10-CM

## 2023-06-07 DIAGNOSIS — E785 Hyperlipidemia, unspecified: Secondary | ICD-10-CM

## 2023-06-07 DIAGNOSIS — K219 Gastro-esophageal reflux disease without esophagitis: Secondary | ICD-10-CM

## 2023-06-07 DIAGNOSIS — M1712 Unilateral primary osteoarthritis, left knee: Secondary | ICD-10-CM

## 2023-06-07 DIAGNOSIS — I10 Essential (primary) hypertension: Secondary | ICD-10-CM

## 2023-06-15 DIAGNOSIS — M48062 Spinal stenosis, lumbar region with neurogenic claudication: Secondary | ICD-10-CM | POA: Diagnosis not present

## 2023-06-15 DIAGNOSIS — M41126 Adolescent idiopathic scoliosis, lumbar region: Secondary | ICD-10-CM | POA: Diagnosis not present

## 2023-06-15 DIAGNOSIS — M532X6 Spinal instabilities, lumbar region: Secondary | ICD-10-CM | POA: Diagnosis not present

## 2023-06-15 DIAGNOSIS — M4156 Other secondary scoliosis, lumbar region: Secondary | ICD-10-CM | POA: Diagnosis not present

## 2023-07-06 DIAGNOSIS — M41126 Adolescent idiopathic scoliosis, lumbar region: Secondary | ICD-10-CM | POA: Diagnosis not present

## 2023-07-06 DIAGNOSIS — M4156 Other secondary scoliosis, lumbar region: Secondary | ICD-10-CM | POA: Diagnosis not present

## 2023-07-06 DIAGNOSIS — M532X6 Spinal instabilities, lumbar region: Secondary | ICD-10-CM | POA: Diagnosis not present

## 2023-07-06 DIAGNOSIS — M48062 Spinal stenosis, lumbar region with neurogenic claudication: Secondary | ICD-10-CM | POA: Diagnosis not present

## 2023-07-15 ENCOUNTER — Other Ambulatory Visit: Payer: Self-pay | Admitting: Family

## 2023-07-18 DIAGNOSIS — I447 Left bundle-branch block, unspecified: Secondary | ICD-10-CM | POA: Diagnosis not present

## 2023-07-25 DIAGNOSIS — M4156 Other secondary scoliosis, lumbar region: Secondary | ICD-10-CM | POA: Diagnosis not present

## 2023-07-25 DIAGNOSIS — M47816 Spondylosis without myelopathy or radiculopathy, lumbar region: Secondary | ICD-10-CM | POA: Diagnosis not present

## 2023-07-25 DIAGNOSIS — M48062 Spinal stenosis, lumbar region with neurogenic claudication: Secondary | ICD-10-CM | POA: Diagnosis not present

## 2023-07-25 DIAGNOSIS — M4316 Spondylolisthesis, lumbar region: Secondary | ICD-10-CM | POA: Diagnosis not present

## 2023-07-25 DIAGNOSIS — M532X6 Spinal instabilities, lumbar region: Secondary | ICD-10-CM | POA: Diagnosis not present

## 2023-07-25 DIAGNOSIS — I428 Other cardiomyopathies: Secondary | ICD-10-CM | POA: Diagnosis not present

## 2023-07-25 DIAGNOSIS — Z419 Encounter for procedure for purposes other than remedying health state, unspecified: Secondary | ICD-10-CM | POA: Diagnosis not present

## 2023-07-25 DIAGNOSIS — I2510 Atherosclerotic heart disease of native coronary artery without angina pectoris: Secondary | ICD-10-CM | POA: Diagnosis not present

## 2023-07-25 DIAGNOSIS — Z981 Arthrodesis status: Secondary | ICD-10-CM | POA: Diagnosis not present

## 2023-07-25 DIAGNOSIS — M41126 Adolescent idiopathic scoliosis, lumbar region: Secondary | ICD-10-CM | POA: Diagnosis not present

## 2023-07-25 DIAGNOSIS — I509 Heart failure, unspecified: Secondary | ICD-10-CM | POA: Diagnosis not present

## 2023-07-25 DIAGNOSIS — I110 Hypertensive heart disease with heart failure: Secondary | ICD-10-CM | POA: Diagnosis not present

## 2023-07-25 HISTORY — PX: BACK SURGERY: SHX140

## 2023-07-30 ENCOUNTER — Encounter: Payer: Self-pay | Admitting: *Deleted

## 2023-07-30 ENCOUNTER — Telehealth: Payer: Self-pay | Admitting: *Deleted

## 2023-07-30 DIAGNOSIS — Z7982 Long term (current) use of aspirin: Secondary | ICD-10-CM | POA: Diagnosis not present

## 2023-07-30 DIAGNOSIS — M41126 Adolescent idiopathic scoliosis, lumbar region: Secondary | ICD-10-CM | POA: Diagnosis not present

## 2023-07-30 DIAGNOSIS — I110 Hypertensive heart disease with heart failure: Secondary | ICD-10-CM | POA: Diagnosis not present

## 2023-07-30 DIAGNOSIS — I509 Heart failure, unspecified: Secondary | ICD-10-CM | POA: Diagnosis not present

## 2023-07-30 DIAGNOSIS — Z981 Arthrodesis status: Secondary | ICD-10-CM | POA: Diagnosis not present

## 2023-07-30 DIAGNOSIS — M48062 Spinal stenosis, lumbar region with neurogenic claudication: Secondary | ICD-10-CM | POA: Diagnosis not present

## 2023-07-30 DIAGNOSIS — Z4789 Encounter for other orthopedic aftercare: Secondary | ICD-10-CM | POA: Diagnosis not present

## 2023-07-30 DIAGNOSIS — M4316 Spondylolisthesis, lumbar region: Secondary | ICD-10-CM | POA: Diagnosis not present

## 2023-07-30 DIAGNOSIS — I2510 Atherosclerotic heart disease of native coronary artery without angina pectoris: Secondary | ICD-10-CM | POA: Diagnosis not present

## 2023-07-30 DIAGNOSIS — M532X6 Spinal instabilities, lumbar region: Secondary | ICD-10-CM | POA: Diagnosis not present

## 2023-07-30 NOTE — Transitions of Care (Post Inpatient/ED Visit) (Addendum)
 07/30/2023  Name: Veronica Gomez MRN: [id number] DOB: Jul 23, 1955  Today's TOC FU Call Status: Today's TOC FU Call Status:: Successful TOC FU Call Completed TOC FU Call Complete Date: 07/30/23 Patient's Name and Date of Birth confirmed.  Transition Care Management Follow-up Telephone Call Date of Discharge: 07/28/23 Discharge Facility: Other [facility]) Name of Other (Non-[facility]) Discharge Facility: [facility] Type of Discharge: Inpatient Admission Primary Inpatient Discharge Diagnosis:: spinal stenosis- elective surgery How have you been since you were released from the hospital?:  (pt states " feeling better, no pain, just soreness", eating and drinking well, ambulating with walker, spouse assists as needed) Any questions or concerns?: No  Items Reviewed: Did you receive and understand the discharge instructions provided?: Yes Medications obtained,verified, and reconciled?: Yes (Medications Reviewed) Any new allergies since your discharge?: No Do you have support at home?: Yes People in Home: spouse Name of Support/Comfort Primary Source: Veronica Gomez Patient declines enrolment in [facility] 30 day program Reviewed signs/ symptoms of infection, reportable signs /symptoms Pain assessment completed Reviewed safety precautions  Medications Reviewed Today: Medications Reviewed Today     Reviewed by Audrie Gallus, RN (Registered Nurse) on 07/30/23 at 1141  Med List Status: <None>   Medication Order Taking? Sig Documenting Provider Last Dose Status Informant  Ascorbic Acid (VITAMIN C PO) [id number] Yes Vitamin C Chewable 500 mg tablet   1 tablet every day by oral route. [provider] Taking Active   aspirin 81 MG tablet [id number] Yes Take 81 mg by mouth daily. [provider] Taking Active   atorvastatin (LIPITOR) 40 MG tablet [id number] Yes TAKE 1 TABLET BY MOUTH DAILY Jannifer Rodney A, FNP Taking Active    baclofen (LIORESAL) 10 MG tablet [id number] Yes TAKE 1 TABLET BY MOUTH THREE TIMES A DAY Hawks, Christy A, FNP Taking Active   carvedilol (COREG) 6.25 MG tablet [id number] Yes TAKE 1 TABLET BY MOUTH TWICE  DAILY WITH A MEAL Hawks, Christy A, FNP Taking Active   Cholecalciferol (VITAMIN D3) 25 MCG (1000 UT) CAPS [id number] Yes Take by mouth daily. [provider] Taking Active   cyanocobalamin (VITAMIN B12) 1000 MCG tablet [id number] Yes Take 3,000 mcg by mouth daily. [provider] Taking Active   diclofenac (VOLTAREN) 75 MG EC tablet [id number] No TAKE 1 TABLET BY MOUTH TWICE  DAILY  Patient not taking: Reported on 07/30/2023   Junie Spencer, FNP Not Taking Active   diclofenac Sodium (VOLTAREN) 1 % GEL [id number] No Apply 2 g topically 4 (four) times daily.  Patient not taking: Reported on 07/30/2023   Junie Spencer, FNP Not Taking Active   hydrochlorothiazide (HYDRODIURIL) 25 MG tablet [id number] Yes Take 1 tablet (25 mg total) by mouth daily. **NEEDS TO BE SEEN BEFORE NEXT REFILL** Junie Spencer, FNP Taking Active   Melatonin-Pyridoxine (MELATIN PO) [id number] Yes Take by mouth. [provider] Taking Active   omeprazole (PRILOSEC) 20 MG capsule [id number] Yes TAKE 1 CAPSULE BY MOUTH DAILY Hawks, Christy A, FNP Taking Active   sacubitril-valsartan (ENTRESTO) 97-103 MG [id number] Yes Take 1 tablet by mouth 2 (two) times daily. Chrystie Nose, MD Taking Active   terbinafine (LAMISIL) 250 MG tablet [id number] No Take 1 tablet (250 mg total) by mouth daily.  Patient not taking: Reported on 07/30/2023   Junie Spencer, FNP Not Taking Active             Home Care and Equipment/Supplies: Were Home Health Services  Ordered?: Yes Name of Home Health Agency:: [facility] Has Agency set up a time to come to your home?: Yes First Home Health Visit Date: 07/30/23 Any new equipment or medical supplies ordered?: Yes Name of Medical supply agency?: pt states "  don't have access to that right now" Were you able to get the equipment/medical supplies?: Yes (walker, BSC   brought home from hospital) Do you have any questions related to the use of the equipment/supplies?: No  Functional Questionnaire: Do you need assistance with bathing/showering or dressing?: Yes (has hand held shower,  is taking bath at sink at present, in process of getting a shower seat, is purchasing) Do you need assistance with meal preparation?: Yes (spouse assists) Do you need assistance with eating?: No Do you have difficulty maintaining continence: No Do you need assistance with getting out of bed/getting out of a chair/moving?: Yes (uses walker) Do you have difficulty managing or taking your medications?: No  Follow up appointments reviewed: PCP Follow-up appointment confirmed?: No (pt states she will be following up with surgeon and is not following up with primary care provider at present, states " I"m not riding anywhere that I don't have to") MD Provider Line [phone] Given: No Specialist Hospital Follow-up appointment confirmed?: Yes Date of Specialist follow-up appointment?:  (April 2025 per pt) Follow-Up Specialty Provider:: surgeon - pt has appointment scheduled for April but cannot remember exact date- does not have her book in front of her Do you need transportation to your follow-up appointment?: No Do you understand care options if your condition(s) worsen?: Yes-patient verbalized understanding  SDOH Interventions Today    Flowsheet Row Most Recent Value  SDOH Interventions   Food Insecurity Interventions Intervention Not Indicated  Housing Interventions Intervention Not Indicated  Transportation Interventions Intervention Not Indicated  Utilities Interventions Intervention Not Indicated       Veronica Gomez [facility], BSN RN Care Manager/ Transition of Care [place]/ [facility] [phone]

## 2023-08-02 DIAGNOSIS — Z981 Arthrodesis status: Secondary | ICD-10-CM | POA: Diagnosis not present

## 2023-08-02 DIAGNOSIS — I509 Heart failure, unspecified: Secondary | ICD-10-CM | POA: Diagnosis not present

## 2023-08-02 DIAGNOSIS — M532X6 Spinal instabilities, lumbar region: Secondary | ICD-10-CM | POA: Diagnosis not present

## 2023-08-02 DIAGNOSIS — I110 Hypertensive heart disease with heart failure: Secondary | ICD-10-CM | POA: Diagnosis not present

## 2023-08-02 DIAGNOSIS — M4316 Spondylolisthesis, lumbar region: Secondary | ICD-10-CM | POA: Diagnosis not present

## 2023-08-02 DIAGNOSIS — I2510 Atherosclerotic heart disease of native coronary artery without angina pectoris: Secondary | ICD-10-CM | POA: Diagnosis not present

## 2023-08-02 DIAGNOSIS — Z7982 Long term (current) use of aspirin: Secondary | ICD-10-CM | POA: Diagnosis not present

## 2023-08-02 DIAGNOSIS — M41126 Adolescent idiopathic scoliosis, lumbar region: Secondary | ICD-10-CM | POA: Diagnosis not present

## 2023-08-02 DIAGNOSIS — Z4789 Encounter for other orthopedic aftercare: Secondary | ICD-10-CM | POA: Diagnosis not present

## 2023-08-02 DIAGNOSIS — M48062 Spinal stenosis, lumbar region with neurogenic claudication: Secondary | ICD-10-CM | POA: Diagnosis not present

## 2023-08-07 DIAGNOSIS — M4316 Spondylolisthesis, lumbar region: Secondary | ICD-10-CM | POA: Diagnosis not present

## 2023-08-07 DIAGNOSIS — M48062 Spinal stenosis, lumbar region with neurogenic claudication: Secondary | ICD-10-CM | POA: Diagnosis not present

## 2023-08-07 DIAGNOSIS — Z4789 Encounter for other orthopedic aftercare: Secondary | ICD-10-CM | POA: Diagnosis not present

## 2023-08-08 DIAGNOSIS — M48062 Spinal stenosis, lumbar region with neurogenic claudication: Secondary | ICD-10-CM | POA: Diagnosis not present

## 2023-08-08 DIAGNOSIS — M4316 Spondylolisthesis, lumbar region: Secondary | ICD-10-CM | POA: Diagnosis not present

## 2023-08-08 DIAGNOSIS — M41126 Adolescent idiopathic scoliosis, lumbar region: Secondary | ICD-10-CM | POA: Diagnosis not present

## 2023-08-08 DIAGNOSIS — M532X6 Spinal instabilities, lumbar region: Secondary | ICD-10-CM | POA: Diagnosis not present

## 2023-08-08 DIAGNOSIS — Z7982 Long term (current) use of aspirin: Secondary | ICD-10-CM | POA: Diagnosis not present

## 2023-08-08 DIAGNOSIS — I110 Hypertensive heart disease with heart failure: Secondary | ICD-10-CM | POA: Diagnosis not present

## 2023-08-08 DIAGNOSIS — I2510 Atherosclerotic heart disease of native coronary artery without angina pectoris: Secondary | ICD-10-CM | POA: Diagnosis not present

## 2023-08-08 DIAGNOSIS — Z981 Arthrodesis status: Secondary | ICD-10-CM | POA: Diagnosis not present

## 2023-08-08 DIAGNOSIS — I509 Heart failure, unspecified: Secondary | ICD-10-CM | POA: Diagnosis not present

## 2023-08-08 DIAGNOSIS — Z4789 Encounter for other orthopedic aftercare: Secondary | ICD-10-CM | POA: Diagnosis not present

## 2023-08-10 DIAGNOSIS — M41126 Adolescent idiopathic scoliosis, lumbar region: Secondary | ICD-10-CM | POA: Diagnosis not present

## 2023-08-10 DIAGNOSIS — M532X6 Spinal instabilities, lumbar region: Secondary | ICD-10-CM | POA: Diagnosis not present

## 2023-08-10 DIAGNOSIS — M4316 Spondylolisthesis, lumbar region: Secondary | ICD-10-CM | POA: Diagnosis not present

## 2023-08-10 DIAGNOSIS — I2510 Atherosclerotic heart disease of native coronary artery without angina pectoris: Secondary | ICD-10-CM | POA: Diagnosis not present

## 2023-08-10 DIAGNOSIS — I509 Heart failure, unspecified: Secondary | ICD-10-CM | POA: Diagnosis not present

## 2023-08-10 DIAGNOSIS — Z981 Arthrodesis status: Secondary | ICD-10-CM | POA: Diagnosis not present

## 2023-08-10 DIAGNOSIS — Z4789 Encounter for other orthopedic aftercare: Secondary | ICD-10-CM | POA: Diagnosis not present

## 2023-08-10 DIAGNOSIS — Z7982 Long term (current) use of aspirin: Secondary | ICD-10-CM | POA: Diagnosis not present

## 2023-08-10 DIAGNOSIS — M48062 Spinal stenosis, lumbar region with neurogenic claudication: Secondary | ICD-10-CM | POA: Diagnosis not present

## 2023-08-10 DIAGNOSIS — I110 Hypertensive heart disease with heart failure: Secondary | ICD-10-CM | POA: Diagnosis not present

## 2023-08-11 ENCOUNTER — Other Ambulatory Visit: Payer: Self-pay | Admitting: Family

## 2023-08-13 MED ORDER — HYDROCHLOROTHIAZIDE 25 MG PO TABS: 25.0000 mg | ORAL_TABLET | Freq: Every day | ORAL | 1 refills | Status: DC

## 2023-08-13 NOTE — Addendum Note (Signed)
 Addended by: Julious Payer D on: 08/13/2023 11:14 AM   Modules accepted: Orders

## 2023-08-13 NOTE — Telephone Encounter (Signed)
 Appt scheduled for 09/25/2023, pt recently had back surgery and can not come in until then.Pt would like refill on medication

## 2023-08-13 NOTE — Telephone Encounter (Signed)
 Chrsity pt NTBS 30-d given 07/16/23

## 2023-08-16 ENCOUNTER — Other Ambulatory Visit: Payer: Self-pay | Admitting: Family

## 2023-08-16 DIAGNOSIS — E785 Hyperlipidemia, unspecified: Secondary | ICD-10-CM

## 2023-08-16 DIAGNOSIS — I2510 Atherosclerotic heart disease of native coronary artery without angina pectoris: Secondary | ICD-10-CM | POA: Diagnosis not present

## 2023-08-16 DIAGNOSIS — Z981 Arthrodesis status: Secondary | ICD-10-CM | POA: Diagnosis not present

## 2023-08-16 DIAGNOSIS — M48062 Spinal stenosis, lumbar region with neurogenic claudication: Secondary | ICD-10-CM | POA: Diagnosis not present

## 2023-08-16 DIAGNOSIS — M4316 Spondylolisthesis, lumbar region: Secondary | ICD-10-CM | POA: Diagnosis not present

## 2023-08-16 DIAGNOSIS — M1712 Unilateral primary osteoarthritis, left knee: Secondary | ICD-10-CM

## 2023-08-16 DIAGNOSIS — Z7982 Long term (current) use of aspirin: Secondary | ICD-10-CM | POA: Diagnosis not present

## 2023-08-16 DIAGNOSIS — I509 Heart failure, unspecified: Secondary | ICD-10-CM | POA: Diagnosis not present

## 2023-08-16 DIAGNOSIS — Z4789 Encounter for other orthopedic aftercare: Secondary | ICD-10-CM | POA: Diagnosis not present

## 2023-08-16 DIAGNOSIS — M41126 Adolescent idiopathic scoliosis, lumbar region: Secondary | ICD-10-CM | POA: Diagnosis not present

## 2023-08-16 DIAGNOSIS — I110 Hypertensive heart disease with heart failure: Secondary | ICD-10-CM | POA: Diagnosis not present

## 2023-08-16 DIAGNOSIS — K219 Gastro-esophageal reflux disease without esophagitis: Secondary | ICD-10-CM

## 2023-08-16 DIAGNOSIS — M532X6 Spinal instabilities, lumbar region: Secondary | ICD-10-CM | POA: Diagnosis not present

## 2023-08-16 DIAGNOSIS — G8929 Other chronic pain: Secondary | ICD-10-CM

## 2023-08-16 DIAGNOSIS — I10 Essential (primary) hypertension: Secondary | ICD-10-CM

## 2023-08-20 DIAGNOSIS — I2510 Atherosclerotic heart disease of native coronary artery without angina pectoris: Secondary | ICD-10-CM | POA: Diagnosis not present

## 2023-08-20 DIAGNOSIS — I110 Hypertensive heart disease with heart failure: Secondary | ICD-10-CM | POA: Diagnosis not present

## 2023-08-20 DIAGNOSIS — M4316 Spondylolisthesis, lumbar region: Secondary | ICD-10-CM | POA: Diagnosis not present

## 2023-08-20 DIAGNOSIS — Z4789 Encounter for other orthopedic aftercare: Secondary | ICD-10-CM | POA: Diagnosis not present

## 2023-08-20 DIAGNOSIS — M532X6 Spinal instabilities, lumbar region: Secondary | ICD-10-CM | POA: Diagnosis not present

## 2023-08-20 DIAGNOSIS — Z981 Arthrodesis status: Secondary | ICD-10-CM | POA: Diagnosis not present

## 2023-08-20 DIAGNOSIS — Z7982 Long term (current) use of aspirin: Secondary | ICD-10-CM | POA: Diagnosis not present

## 2023-08-20 DIAGNOSIS — M41126 Adolescent idiopathic scoliosis, lumbar region: Secondary | ICD-10-CM | POA: Diagnosis not present

## 2023-08-20 DIAGNOSIS — M48062 Spinal stenosis, lumbar region with neurogenic claudication: Secondary | ICD-10-CM | POA: Diagnosis not present

## 2023-08-20 DIAGNOSIS — I509 Heart failure, unspecified: Secondary | ICD-10-CM | POA: Diagnosis not present

## 2023-08-24 DIAGNOSIS — M4156 Other secondary scoliosis, lumbar region: Secondary | ICD-10-CM | POA: Diagnosis not present

## 2023-08-29 DIAGNOSIS — M4316 Spondylolisthesis, lumbar region: Secondary | ICD-10-CM | POA: Diagnosis not present

## 2023-08-29 DIAGNOSIS — Z4789 Encounter for other orthopedic aftercare: Secondary | ICD-10-CM | POA: Diagnosis not present

## 2023-08-29 DIAGNOSIS — M41126 Adolescent idiopathic scoliosis, lumbar region: Secondary | ICD-10-CM | POA: Diagnosis not present

## 2023-08-29 DIAGNOSIS — Z981 Arthrodesis status: Secondary | ICD-10-CM | POA: Diagnosis not present

## 2023-08-29 DIAGNOSIS — Z7982 Long term (current) use of aspirin: Secondary | ICD-10-CM | POA: Diagnosis not present

## 2023-08-29 DIAGNOSIS — I2510 Atherosclerotic heart disease of native coronary artery without angina pectoris: Secondary | ICD-10-CM | POA: Diagnosis not present

## 2023-08-29 DIAGNOSIS — I509 Heart failure, unspecified: Secondary | ICD-10-CM | POA: Diagnosis not present

## 2023-08-29 DIAGNOSIS — I110 Hypertensive heart disease with heart failure: Secondary | ICD-10-CM | POA: Diagnosis not present

## 2023-08-29 DIAGNOSIS — M532X6 Spinal instabilities, lumbar region: Secondary | ICD-10-CM | POA: Diagnosis not present

## 2023-08-29 DIAGNOSIS — M48062 Spinal stenosis, lumbar region with neurogenic claudication: Secondary | ICD-10-CM | POA: Diagnosis not present

## 2023-09-04 DIAGNOSIS — Z4789 Encounter for other orthopedic aftercare: Secondary | ICD-10-CM | POA: Diagnosis not present

## 2023-09-04 DIAGNOSIS — I509 Heart failure, unspecified: Secondary | ICD-10-CM | POA: Diagnosis not present

## 2023-09-04 DIAGNOSIS — I2510 Atherosclerotic heart disease of native coronary artery without angina pectoris: Secondary | ICD-10-CM | POA: Diagnosis not present

## 2023-09-04 DIAGNOSIS — Z7982 Long term (current) use of aspirin: Secondary | ICD-10-CM | POA: Diagnosis not present

## 2023-09-04 DIAGNOSIS — M41126 Adolescent idiopathic scoliosis, lumbar region: Secondary | ICD-10-CM | POA: Diagnosis not present

## 2023-09-04 DIAGNOSIS — Z981 Arthrodesis status: Secondary | ICD-10-CM | POA: Diagnosis not present

## 2023-09-04 DIAGNOSIS — I110 Hypertensive heart disease with heart failure: Secondary | ICD-10-CM | POA: Diagnosis not present

## 2023-09-04 DIAGNOSIS — M48062 Spinal stenosis, lumbar region with neurogenic claudication: Secondary | ICD-10-CM | POA: Diagnosis not present

## 2023-09-04 DIAGNOSIS — M532X6 Spinal instabilities, lumbar region: Secondary | ICD-10-CM | POA: Diagnosis not present

## 2023-09-04 DIAGNOSIS — M4316 Spondylolisthesis, lumbar region: Secondary | ICD-10-CM | POA: Diagnosis not present

## 2023-09-11 DIAGNOSIS — M41126 Adolescent idiopathic scoliosis, lumbar region: Secondary | ICD-10-CM | POA: Diagnosis not present

## 2023-09-11 DIAGNOSIS — M532X6 Spinal instabilities, lumbar region: Secondary | ICD-10-CM | POA: Diagnosis not present

## 2023-09-11 DIAGNOSIS — M4316 Spondylolisthesis, lumbar region: Secondary | ICD-10-CM | POA: Diagnosis not present

## 2023-09-11 DIAGNOSIS — M48062 Spinal stenosis, lumbar region with neurogenic claudication: Secondary | ICD-10-CM | POA: Diagnosis not present

## 2023-09-11 DIAGNOSIS — I2510 Atherosclerotic heart disease of native coronary artery without angina pectoris: Secondary | ICD-10-CM | POA: Diagnosis not present

## 2023-09-11 DIAGNOSIS — Z7982 Long term (current) use of aspirin: Secondary | ICD-10-CM | POA: Diagnosis not present

## 2023-09-11 DIAGNOSIS — I509 Heart failure, unspecified: Secondary | ICD-10-CM | POA: Diagnosis not present

## 2023-09-11 DIAGNOSIS — Z4789 Encounter for other orthopedic aftercare: Secondary | ICD-10-CM | POA: Diagnosis not present

## 2023-09-11 DIAGNOSIS — I110 Hypertensive heart disease with heart failure: Secondary | ICD-10-CM | POA: Diagnosis not present

## 2023-09-11 DIAGNOSIS — Z981 Arthrodesis status: Secondary | ICD-10-CM | POA: Diagnosis not present

## 2023-09-17 DIAGNOSIS — M41126 Adolescent idiopathic scoliosis, lumbar region: Secondary | ICD-10-CM | POA: Diagnosis not present

## 2023-09-17 DIAGNOSIS — M48062 Spinal stenosis, lumbar region with neurogenic claudication: Secondary | ICD-10-CM | POA: Diagnosis not present

## 2023-09-17 DIAGNOSIS — Z981 Arthrodesis status: Secondary | ICD-10-CM | POA: Diagnosis not present

## 2023-09-17 DIAGNOSIS — I509 Heart failure, unspecified: Secondary | ICD-10-CM | POA: Diagnosis not present

## 2023-09-17 DIAGNOSIS — Z7982 Long term (current) use of aspirin: Secondary | ICD-10-CM | POA: Diagnosis not present

## 2023-09-17 DIAGNOSIS — M4316 Spondylolisthesis, lumbar region: Secondary | ICD-10-CM | POA: Diagnosis not present

## 2023-09-17 DIAGNOSIS — I2510 Atherosclerotic heart disease of native coronary artery without angina pectoris: Secondary | ICD-10-CM | POA: Diagnosis not present

## 2023-09-17 DIAGNOSIS — Z4789 Encounter for other orthopedic aftercare: Secondary | ICD-10-CM | POA: Diagnosis not present

## 2023-09-17 DIAGNOSIS — I110 Hypertensive heart disease with heart failure: Secondary | ICD-10-CM | POA: Diagnosis not present

## 2023-09-17 DIAGNOSIS — M532X6 Spinal instabilities, lumbar region: Secondary | ICD-10-CM | POA: Diagnosis not present

## 2023-09-20 ENCOUNTER — Encounter (HOSPITAL_COMMUNITY): Payer: Self-pay

## 2023-09-24 DIAGNOSIS — Z981 Arthrodesis status: Secondary | ICD-10-CM | POA: Diagnosis not present

## 2023-09-24 DIAGNOSIS — I2510 Atherosclerotic heart disease of native coronary artery without angina pectoris: Secondary | ICD-10-CM | POA: Diagnosis not present

## 2023-09-24 DIAGNOSIS — I110 Hypertensive heart disease with heart failure: Secondary | ICD-10-CM | POA: Diagnosis not present

## 2023-09-24 DIAGNOSIS — Z4789 Encounter for other orthopedic aftercare: Secondary | ICD-10-CM | POA: Diagnosis not present

## 2023-09-24 DIAGNOSIS — I509 Heart failure, unspecified: Secondary | ICD-10-CM | POA: Diagnosis not present

## 2023-09-24 DIAGNOSIS — M4316 Spondylolisthesis, lumbar region: Secondary | ICD-10-CM | POA: Diagnosis not present

## 2023-09-24 DIAGNOSIS — M48062 Spinal stenosis, lumbar region with neurogenic claudication: Secondary | ICD-10-CM | POA: Diagnosis not present

## 2023-09-24 DIAGNOSIS — M532X6 Spinal instabilities, lumbar region: Secondary | ICD-10-CM | POA: Diagnosis not present

## 2023-09-24 DIAGNOSIS — M41126 Adolescent idiopathic scoliosis, lumbar region: Secondary | ICD-10-CM | POA: Diagnosis not present

## 2023-09-24 DIAGNOSIS — Z7982 Long term (current) use of aspirin: Secondary | ICD-10-CM | POA: Diagnosis not present

## 2023-09-25 ENCOUNTER — Ambulatory Visit: Admitting: Family

## 2023-09-27 ENCOUNTER — Encounter: Payer: Self-pay | Admitting: Family

## 2023-09-27 ENCOUNTER — Ambulatory Visit (INDEPENDENT_AMBULATORY_CARE_PROVIDER_SITE_OTHER): Admitting: Family

## 2023-09-27 VITALS — BP 112/76 | HR 69 | Temp 97.6°F | Ht 63.0 in | Wt 177.6 lb

## 2023-09-27 DIAGNOSIS — I10 Essential (primary) hypertension: Secondary | ICD-10-CM | POA: Diagnosis not present

## 2023-09-27 DIAGNOSIS — K219 Gastro-esophageal reflux disease without esophagitis: Secondary | ICD-10-CM | POA: Diagnosis not present

## 2023-09-27 DIAGNOSIS — M545 Low back pain, unspecified: Secondary | ICD-10-CM

## 2023-09-27 DIAGNOSIS — G8929 Other chronic pain: Secondary | ICD-10-CM | POA: Diagnosis not present

## 2023-09-27 DIAGNOSIS — E663 Overweight: Secondary | ICD-10-CM

## 2023-09-27 DIAGNOSIS — E785 Hyperlipidemia, unspecified: Secondary | ICD-10-CM | POA: Diagnosis not present

## 2023-09-27 DIAGNOSIS — I255 Ischemic cardiomyopathy: Secondary | ICD-10-CM

## 2023-09-27 MED ORDER — HYDROCHLOROTHIAZIDE 25 MG PO TABS: 25.0000 mg | ORAL_TABLET | Freq: Every day | ORAL | 1 refills | Status: DC

## 2023-09-27 MED ORDER — OMEPRAZOLE 20 MG PO CPDR: 20.0000 mg | DELAYED_RELEASE_CAPSULE | Freq: Every day | ORAL | 0 refills | Status: DC

## 2023-09-27 MED ORDER — CARVEDILOL 6.25 MG PO TABS: 6.2500 mg | ORAL_TABLET | Freq: Two times a day (BID) | ORAL | 0 refills | Status: DC

## 2023-09-27 MED ORDER — BACLOFEN 10 MG PO TABS: 10.0000 mg | ORAL_TABLET | Freq: Three times a day (TID) | ORAL | 0 refills | Status: DC

## 2023-09-27 MED ORDER — ATORVASTATIN CALCIUM 40 MG PO TABS: 40.0000 mg | ORAL_TABLET | Freq: Every day | ORAL | 0 refills | Status: DC

## 2023-09-27 NOTE — Patient Instructions (Signed)
 Health Maintenance After Age 70 After age 4, you are at a higher risk for certain long-term diseases and infections as well as injuries from falls. Falls are a major cause of broken bones and head injuries in people who are older than age 47. Getting regular preventive care can help to keep you healthy and well. Preventive care includes getting regular testing and making lifestyle changes as recommended by your health care provider. Talk with your health care provider about: Which screenings and tests you should have. A screening is a test that checks for a disease when you have no symptoms. A diet and exercise plan that is right for you. What should I know about screenings and tests to prevent falls? Screening and testing are the best ways to find a health problem early. Early diagnosis and treatment give you the best chance of managing medical conditions that are common after age 37. Certain conditions and lifestyle choices may make you more likely to have a fall. Your health care provider may recommend: Regular vision checks. Poor vision and conditions such as cataracts can make you more likely to have a fall. If you wear glasses, make sure to get your prescription updated if your vision changes. Medicine review. Work with your health care provider to regularly review all of the medicines you are taking, including over-the-counter medicines. Ask your health care provider about any side effects that may make you more likely to have a fall. Tell your health care provider if any medicines that you take make you feel dizzy or sleepy. Strength and balance checks. Your health care provider may recommend certain tests to check your strength and balance while standing, walking, or changing positions. Foot health exam. Foot pain and numbness, as well as not wearing proper footwear, can make you more likely to have a fall. Screenings, including: Osteoporosis screening. Osteoporosis is a condition that causes  the bones to get weaker and break more easily. Blood pressure screening. Blood pressure changes and medicines to control blood pressure can make you feel dizzy. Depression screening. You may be more likely to have a fall if you have a fear of falling, feel depressed, or feel unable to do activities that you used to do. Alcohol use screening. Using too much alcohol can affect your balance and may make you more likely to have a fall. Follow these instructions at home: Lifestyle Do not drink alcohol if: Your health care provider tells you not to drink. If you drink alcohol: Limit how much you have to: 0-1 drink a day for women. 0-2 drinks a day for men. Know how much alcohol is in your drink. In the [place] one drink equals one 12 oz bottle of beer (355 mL), one 5 oz glass of wine (148 mL), or one 1 oz glass of hard liquor (44 mL). Do not use any products that contain nicotine or tobacco. These products include cigarettes, chewing tobacco, and vaping devices, such as e-cigarettes. If you need help quitting, ask your health care provider. Activity  Follow a regular exercise program to stay fit. This will help you maintain your balance. Ask your health care provider what types of exercise are appropriate for you. If you need a cane or walker, use it as recommended by your health care provider. Wear supportive shoes that have nonskid soles. Safety  Remove any tripping hazards, such as rugs, cords, and clutter. Install safety equipment such as grab bars in bathrooms and safety rails on stairs. Keep rooms and walkways  well-lit. General instructions Talk with your health care provider about your risks for falling. Tell your health care provider if: You fall. Be sure to tell your health care provider about all falls, even ones that seem minor. You feel dizzy, tiredness (fatigue), or off-balance. Take over-the-counter and prescription medicines only as told by your health care provider. These include  supplements. Eat a healthy diet and maintain a healthy weight. A healthy diet includes low-fat dairy products, low-fat (lean) meats, and fiber from whole grains, beans, and lots of fruits and vegetables. Stay current with your vaccines. Schedule regular health, dental, and eye exams. Summary Having a healthy lifestyle and getting preventive care can help to protect your health and wellness after age 11. Screening and testing are the best way to find a health problem early and help you avoid having a fall. Early diagnosis and treatment give you the best chance for managing medical conditions that are more common for people who are older than age 28. Falls are a major cause of broken bones and head injuries in people who are older than age 48. Take precautions to prevent a fall at home. Work with your health care provider to learn what changes you can make to improve your health and wellness and to prevent falls. This information is not intended to replace advice given to you by your health care provider. Make sure you discuss any questions you have with your health care provider. Document Revised: 09/20/2020 Document Reviewed: 09/20/2020 Elsevier Patient Education  2024 [employer].

## 2023-09-27 NOTE — Progress Notes (Signed)
 Subjective:    Patient ID: Veronica Gomez, female    DOB: Sep 17, 1955, 68 y.o.   MRN: [id number]  Chief Complaint  Patient presents with   Medical Management of Chronic Issues   Pt presents to the office today for chronic follow up.   She had laminectomry and fusion, pedicle screws, iliac fixation on 07/25/23. Reports her pain is a 2 out 10. She is followed by Neurosurgeon.   She is followed by Cardiologists every 6 months for HTN and cardiomyopathy.  Hypertension This is a chronic problem. The current episode started more than 1 year ago. The problem has been resolved since onset. The problem is controlled. Associated symptoms include malaise/fatigue. Pertinent negatives include no peripheral edema or shortness of breath. Risk factors for coronary artery disease include dyslipidemia, obesity and sedentary lifestyle. The current treatment provides moderate improvement.  Gastroesophageal Reflux She complains of belching and heartburn. This is a chronic problem. The current episode started more than 1 year ago. The problem occurs occasionally. The symptoms are aggravated by certain foods. She has tried a PPI for the symptoms. The treatment provided moderate relief.  Hyperlipidemia This is a chronic problem. The current episode started more than 1 year ago. The problem is controlled. Recent lipid tests were reviewed and are normal. Exacerbating diseases include obesity. Pertinent negatives include no shortness of breath. Current antihyperlipidemic treatment includes statins. The current treatment provides moderate improvement of lipids. Risk factors for coronary artery disease include dyslipidemia, hypertension, a sedentary lifestyle and post-menopausal.  Back Pain This is a chronic problem. The current episode started more than 1 year ago. The problem occurs intermittently. The quality of the pain is described as aching. The pain is at a severity of 2/10. The pain is moderate. The symptoms are  aggravated by twisting and bending. She has tried bed rest for the symptoms. The treatment provided mild relief.      Review of Systems  Constitutional:  Positive for malaise/fatigue.  Respiratory:  Negative for shortness of breath.   Gastrointestinal:  Positive for heartburn.  Musculoskeletal:  Positive for back pain.  All other systems reviewed and are negative.  Family History  Problem Relation Age of Onset   Osteoporosis Mother    Asthma Mother    Hypertension Mother    Hypertension Father    Heart disease Father        MI   Social History   Socioeconomic History   Marital status: Married    Spouse name: Not on file   Number of children: Not on file   Years of education: Not on file   Highest education level: Not on file  Occupational History   Not on file  Tobacco Use   Smoking status: Former    Current packs/day: 0.00    Types: Cigarettes    Quit date: 05/15/1988    Years since quitting: 35.3   Smokeless tobacco: Never  Vaping Use   Vaping status: Never Used  Substance and Sexual Activity   Alcohol use: Yes    Comment: occasionally - beer   Drug use: No   Sexual activity: Yes    Birth control/protection: Post-menopausal  Other Topics Concern   Not on file  Social History Narrative   Not on file   Social Drivers of Health   Financial Resource Strain: Low Risk  (05/04/2023)   Overall Financial Resource Strain (CARDIA)    Difficulty of Paying Living Expenses: Not hard at all  Food Insecurity: No Food Insecurity (07/30/2023)  Hunger Vital Sign    Worried About Running Out of Food in the Last Year: Never true    Ran Out of Food in the Last Year: Never true  Transportation Needs: No Transportation Needs (07/30/2023)   PRAPARE - Administrator, Civil Service (Medical): No    Lack of Transportation (Non-Medical): No  Physical Activity: Inactive (05/04/2023)   Exercise Vital Sign    Days of Exercise per Week: 0 days    Minutes of Exercise per  Session: 0 min  Stress: No Stress Concern Present (05/04/2023)   [employer] of Occupational Health - Occupational Stress Questionnaire    Feeling of Stress : Not at all  Social Connections: Socially Integrated (05/04/2023)   Social Connection and Isolation Panel [NHANES]    Frequency of Communication with Friends and Family: More than three times a week    Frequency of Social Gatherings with Friends and Family: Three times a week    Attends Religious Services: More than 4 times per year    Active Member of Clubs or Organizations: Yes    Attends Banker Meetings: More than 4 times per year    Marital Status: Married       Objective:   Physical Exam Vitals reviewed.  Constitutional:      General: She is not in acute distress.    Appearance: She is well-developed.  HENT:     Head: Normocephalic and atraumatic.     Right Ear: Tympanic membrane normal.     Left Ear: Tympanic membrane normal.  Eyes:     Pupils: Pupils are equal, round, and reactive to light.  Neck:     Thyroid : No thyromegaly.  Cardiovascular:     Rate and Rhythm: Normal rate and regular rhythm.     Heart sounds: Normal heart sounds. No murmur heard. Pulmonary:     Effort: Pulmonary effort is normal. No respiratory distress.     Breath sounds: Normal breath sounds. No wheezing.  Abdominal:     General: Bowel sounds are normal. There is no distension.     Palpations: Abdomen is soft.     Tenderness: There is no abdominal tenderness.  Musculoskeletal:        General: No tenderness.     Cervical back: Normal range of motion and neck supple.     Comments: Back brace in place, pain in lumbar and thoracic with flexion and extension  Skin:    General: Skin is warm and dry.  Neurological:     Mental Status: She is alert and oriented to person, place, and time.     Cranial Nerves: No cranial nerve deficit.     Deep Tendon Reflexes: Reflexes are normal and symmetric.  Psychiatric:         Behavior: Behavior normal.        Thought Content: Thought content normal.        Judgment: Judgment normal.       BP 112/76   Pulse 69   Temp 97.6 F (36.4 C) (Temporal)   Ht 5\' 3"  (1.6 m)   Wt 177 lb 9.6 oz (80.6 kg)   LMP 09/13/2008 (Approximate)   PF 100 L/min   BMI 31.46 kg/m      Assessment & Plan:  Veronica Gomez comes in today with chief complaint of Medical Management of Chronic Issues   Diagnosis and orders addressed:  1. Dyslipidemia - atorvastatin  (LIPITOR) 40 MG tablet; Take 1 tablet (40 mg total) by mouth  daily.  Dispense: 100 tablet; Refill: 0  2. Low back pain without sciatica, unspecified back pain laterality, unspecified chronicity - baclofen  (LIORESAL ) 10 MG tablet; Take 1 tablet (10 mg total) by mouth 3 (three) times daily.  Dispense: 270 tablet; Refill: 0  3. Gastroesophageal reflux disease - omeprazole  (PRILOSEC) 20 MG capsule; Take 1 capsule (20 mg total) by mouth daily.  Dispense: 100 capsule; Refill: 0  4. Primary hypertension (Primary) - carvedilol  (COREG ) 6.25 MG tablet; Take 1 tablet (6.25 mg total) by mouth 2 (two) times daily with a meal.  Dispense: 200 tablet; Refill: 0 - hydrochlorothiazide  (HYDRODIURIL ) 25 MG tablet; Take 1 tablet (25 mg total) by mouth daily.  Dispense: 30 tablet; Refill: 1  5. Chronic bilateral low back pain without sciatica  6. Overweight (BMI 25.0-29.9)  7. Cardiomyopathy, ischemic   Labs reviewed from Neurosurgeon  Continue current medications  Keep specialists  Health Maintenance reviewed Diet and exercise encouraged  Follow up plan: 6 months    Tommas Fragmin, FNP

## 2023-10-03 ENCOUNTER — Ambulatory Visit: Admitting: Podiatry

## 2023-10-03 ENCOUNTER — Other Ambulatory Visit: Payer: Self-pay | Admitting: Internal Medicine

## 2023-10-03 DIAGNOSIS — Z79899 Other long term (current) drug therapy: Secondary | ICD-10-CM

## 2023-10-03 DIAGNOSIS — B351 Tinea unguium: Secondary | ICD-10-CM | POA: Diagnosis not present

## 2023-10-03 NOTE — Progress Notes (Signed)
 Subjective:  Patient ID: Veronica Gomez, female    DOB: [birth year],  MRN: [id number]  Chief Complaint  Patient presents with   Nail Problem     Nail trim     68 y.o. female presents with the above complaint.  Patient presents with thickened onychodystrophy mycotic nail x 10.  Patient states painful touch is progressive gotten worse.  She would like to discuss oral medication she has tried topical in the past she has tried 1 round of oral medication last year which helped some but she states that is still present.  Denies doing anything else.  Denies any other acute complaints   Review of Systems: Negative except as noted in the HPI. Denies N/V/F/Ch.  Past Medical History:  Diagnosis Date   CAD (coronary artery disease)    Cardiomyopathy (HCC)    GERD (gastroesophageal reflux disease)    Hyperlipidemia    Hypertension    Ischemia    Left bundle branch block (LBBB)     Current Outpatient Medications:    Ascorbic Acid (VITAMIN C PO), Vitamin C Chewable 500 mg tablet   1 tablet every day by oral route., Disp: , Rfl:    ascorbic acid (VITAMIN C) 500 MG tablet, Take 500 mg by mouth., Disp: , Rfl:    aspirin 81 MG tablet, Take 81 mg by mouth daily., Disp: , Rfl:    atorvastatin  (LIPITOR) 40 MG tablet, Take 1 tablet (40 mg total) by mouth daily., Disp: 100 tablet, Rfl: 0   baclofen  (LIORESAL ) 10 MG tablet, Take 1 tablet (10 mg total) by mouth 3 (three) times daily., Disp: 270 tablet, Rfl: 0   Calcium  Carb-Cholecalciferol (OYSTER SHELL CALCIUM  250+D) 250-3.125 MG-MCG TABS, Take 1 tablet by mouth., Disp: , Rfl:    carvedilol  (COREG ) 6.25 MG tablet, Take 1 tablet (6.25 mg total) by mouth 2 (two) times daily with a meal., Disp: 200 tablet, Rfl: 0   Cholecalciferol (VITAMIN D3) 25 MCG (1000 UT) CAPS, Take by mouth daily., Disp: , Rfl:    cyanocobalamin (VITAMIN B12) 1000 MCG tablet, Take 3,000 mcg by mouth daily., Disp: , Rfl:    diclofenac  (VOLTAREN ) 75 MG EC tablet, TAKE 1 TABLET BY MOUTH  TWICE  DAILY, Disp: 200 tablet, Rfl: 0   diclofenac  Sodium (VOLTAREN ) 1 % GEL, Apply 2 g topically 4 (four) times daily., Disp: 350 g, Rfl: 1   diphenhydrAMINE (BENADRYL) 25 mg capsule, Take 25 mg by mouth., Disp: , Rfl:    hydrochlorothiazide  (HYDRODIURIL ) 25 MG tablet, Take 1 tablet (25 mg total) by mouth daily., Disp: 30 tablet, Rfl: 1   HYDROcodone-acetaminophen (NORCO/VICODIN) 5-325 MG tablet, Take 1 tablet by mouth every 4 (four) hours as needed for moderate pain (pain score 4-6)., Disp: , Rfl:    melatonin 3 MG TABS tablet, Take 3 mg by mouth., Disp: , Rfl:    Melatonin-Pyridoxine (MELATIN PO), Take by mouth., Disp: , Rfl:    omeprazole  (PRILOSEC) 20 MG capsule, Take 1 capsule (20 mg total) by mouth daily., Disp: 100 capsule, Rfl: 0   sacubitril -valsartan  (ENTRESTO ) 97-103 MG, TAKE 1 TABLET BY MOUTH TWICE A DAY, Disp: 180 tablet, Rfl: 1   terbinafine  (LAMISIL ) 250 MG tablet, Take 1 tablet (250 mg total) by mouth daily., Disp: 90 tablet, Rfl: 0   tiZANidine (ZANAFLEX) 4 MG tablet, Take 2-4 mg by mouth every 8 (eight) hours as needed., Disp: , Rfl:   Social History   Tobacco Use  Smoking Status Former   Current packs/day: 0.00  Types: Cigarettes   Quit date: 05/15/1988   Years since quitting: 35.4  Smokeless Tobacco Never    Allergies  Allergen Reactions   Farxiga  [Dapagliflozin ]     myalgias   Jardiance  [Empagliflozin ]     Yeast    Objective:  There were no vitals filed for this visit. There is no height or weight on file to calculate BMI. Constitutional Well developed. Well nourished.  Vascular Dorsalis pedis pulses palpable bilaterally. Posterior tibial pulses palpable bilaterally. Capillary refill normal to all digits.  No cyanosis or clubbing noted. Pedal hair growth normal.  Neurologic Normal speech. Oriented to person, place, and time. Epicritic sensation to light touch grossly present bilaterally.  Dermatologic Nails thickened onychodystrophy mycotic toenails x  10 Skin within normal limits  Orthopedic: Normal joint ROM without pain or crepitus bilaterally. No visible deformities. No bony tenderness.   Radiographs: None Assessment:   1. Long-term use of high-risk medication   2. Nail fungus   3. Onychomycosis due to dermatophyte    Plan:  Patient was evaluated and treated and all questions answered.  Onychomycosis toenails x 7 -Educated the patient on the etiology of onychomycosis and various treatment options associated with improving the fungal load.  I explained to the patient that there is 3 treatment options available to treat the onychomycosis including topical, p.o., laser treatment.  Patient elected to undergo p.o. options with Lamisil /terbinafine  therapy.  In order for me to start the medication therapy, I explained to the patient the importance of evaluating the liver and obtaining the liver function test.  Once the liver function test comes back normal I will start him on 69-month course of Lamisil  therapy.  Patient understood all risk and would like to proceed with Lamisil  therapy.  I have asked the patient to immediately stop the Lamisil  therapy if she has any reactions to it and call the office or go to the emergency room right away.  Patient states understanding   Return in about 3 months (around 01/03/2024).

## 2023-10-05 ENCOUNTER — Other Ambulatory Visit

## 2023-10-05 DIAGNOSIS — Z79899 Other long term (current) drug therapy: Secondary | ICD-10-CM | POA: Diagnosis not present

## 2023-10-06 ENCOUNTER — Other Ambulatory Visit: Payer: Self-pay | Admitting: Family

## 2023-10-06 DIAGNOSIS — B351 Tinea unguium: Secondary | ICD-10-CM

## 2023-10-06 LAB — HEPATIC FUNCTION PANEL
ALT: 11 IU/L (ref 0–32)
AST: 11 IU/L (ref 0–40)
Albumin: 4.3 g/dL (ref 3.9–4.9)
Alkaline Phosphatase: 82 IU/L (ref 44–121)
Bilirubin Total: 0.3 mg/dL (ref 0.0–1.2)
Bilirubin, Direct: 0.13 mg/dL (ref 0.00–0.40)
Total Protein: 7 g/dL (ref 6.0–8.5)

## 2023-10-09 NOTE — Telephone Encounter (Signed)
 Pt needs to contact podiatry for this refill, see their OV notes.

## 2023-10-15 MED ORDER — TERBINAFINE HCL 250 MG PO TABS: 250.0000 mg | ORAL_TABLET | Freq: Every day | ORAL | 0 refills | Status: DC

## 2023-10-15 NOTE — Addendum Note (Signed)
 Addended by: Murrel Bertram on: 10/15/2023 08:33 AM   Modules accepted: Orders

## 2023-10-19 DIAGNOSIS — M4325 Fusion of spine, thoracolumbar region: Secondary | ICD-10-CM | POA: Diagnosis not present

## 2023-10-25 ENCOUNTER — Other Ambulatory Visit: Payer: Self-pay | Admitting: Family

## 2023-10-25 DIAGNOSIS — I10 Essential (primary) hypertension: Secondary | ICD-10-CM

## 2023-10-25 DIAGNOSIS — G8929 Other chronic pain: Secondary | ICD-10-CM

## 2023-10-25 DIAGNOSIS — E785 Hyperlipidemia, unspecified: Secondary | ICD-10-CM

## 2023-10-25 DIAGNOSIS — K219 Gastro-esophageal reflux disease without esophagitis: Secondary | ICD-10-CM

## 2023-10-25 DIAGNOSIS — M1712 Unilateral primary osteoarthritis, left knee: Secondary | ICD-10-CM

## 2023-11-07 ENCOUNTER — Ambulatory Visit: Attending: Internal Medicine

## 2023-11-07 DIAGNOSIS — M6281 Muscle weakness (generalized): Secondary | ICD-10-CM | POA: Insufficient documentation

## 2023-11-07 DIAGNOSIS — M5459 Other low back pain: Secondary | ICD-10-CM | POA: Diagnosis not present

## 2023-11-07 NOTE — Therapy (Signed)
 OUTPATIENT PHYSICAL THERAPY THORACOLUMBAR EVALUATION   Patient Name: Veronica Gomez MRN: [id number] DOB:[birth year], 68 y.o., female Today's Date: 11/07/2023  END OF SESSION:  PT End of Session - 11/07/23 0853     Visit Number 1    Number of Visits 8    Date for PT Re-Evaluation 01/11/24    PT Start Time 0852    PT Stop Time 0945    PT Time Calculation (min) 53 min    Activity Tolerance Patient tolerated treatment well    Behavior During Therapy [facility] for tasks assessed/performed          Past Medical History:  Diagnosis Date   CAD (coronary artery disease)    Cardiomyopathy (HCC)    GERD (gastroesophageal reflux disease)    Hyperlipidemia    Hypertension    Ischemia    Left bundle branch block (LBBB)    Past Surgical History:  Procedure Laterality Date   BACK SURGERY  07/25/2023   CERVICAL POLYPECTOMY  05/15/2008   FINGER SURGERY  05/15/2006   SHOULDER SURGERY Right 05/15/2012   TUBAL LIGATION  05/15/1986   Patient Active Problem List   Diagnosis Date Noted   Chronic bilateral low back pain without sciatica 02/03/2022   Combined forms of age-related cataract of left eye 10/15/2018   Regular astigmatism, left eye 10/15/2018   Chronic heart disease 10/08/2018   Gastroesophageal reflux disease 07/25/2017   Allergic rhinitis due to allergen 08/25/2016   Overweight (BMI 25.0-29.9) 09/14/2014   HTN (hypertension) 11/20/2013   Dyslipidemia 11/20/2013   Cardiomyopathy, ischemic 11/20/2013   LBBB (left bundle branch block) 11/20/2013    PCP: Lavell Bari LABOR, FNP  REFERRING PROVIDER: Moffo, Katelyn Rose, PA-C   REFERRING DIAG: S/P T10-Pelvis-DOS 07/25/23-Back Pain   Rationale for Evaluation and Treatment: Rehabilitation  THERAPY DIAG:  Other low back pain  Muscle weakness (generalized)  ONSET DATE: 07/25/23  SUBJECTIVE:                                                                                                                                                                                            SUBJECTIVE STATEMENT: Patient reports that she had back surgery on 07/25/23 to put rods from T10 to her pelvis. She notes that the first few months were rough, but she is getting better. She had home health physical therapy and she has been doing their exercises every morning.   PERTINENT HISTORY:  Hypertension  PAIN:  Are you having pain? Yes: NPRS scale: Current: 1-2/10 Best: 0/10 Worst: 2/10 Pain location: low back Pain description: soreness Aggravating factors: prolonged standing (1-1.5 hours)  Relieving factors: rest  PRECAUTIONS: Back; gradually progress to lifting 40 pounds  RED FLAGS: None   WEIGHT BEARING RESTRICTIONS: No  FALLS:  Has patient fallen in last 6 months? No  LIVING ENVIRONMENT: Lives with: lives with their spouse Lives in: House/apartment Stairs: Yes: External: 6 steps; can reach both; step to pattern Has following equipment at home: Quad cane small base  OCCUPATION: retired  PLOF: Independent  PATIENT GOALS: be able to go up down steps easier, improved ease with transfers, be able to travel, and be able to pick up things from the floor   NEXT MD VISIT: 01/18/24  OBJECTIVE:  Note: Objective measures were completed at Evaluation unless otherwise noted.  PATIENT SURVEYS:  Modified Oswestry:  MODIFIED OSWESTRY DISABILITY SCALE  Date: 11/07/23 Score  Pain intensity 0 = I can tolerate the pain I have without having to use pain medication.  2. Personal care (washing, dressing, etc.) 0 =  I can take care of myself normally without causing increased pain.  3. Lifting 1 = I can lift heavy weights, but it causes increased pain.  4. Walking 0 = Pain does not prevent me from walking any distance  5. Sitting 1 =  I can only sit in my favorite chair as long as I like.  6. Standing 1 =  I can stand as long as I want but, it increases my pain.  7. Sleeping 0 = Pain does not prevent me from sleeping well.  8. Social Life  0 = My social life is normal and does not increase my pain.  9. Traveling 3 = My pain restricts my travel over 1 hour  10. Employment/ Homemaking 2 = I can perform most of my homemaking/job duties, but pain prevents me from performing more physically stressful activities (eg, lifting, vacuuming).  Total 8/50   Interpretation of scores: Score Category Description  0-20% Minimal Disability The patient can cope with most living activities. Usually no treatment is indicated apart from advice on lifting, sitting and exercise  21-40% Moderate Disability The patient experiences more pain and difficulty with sitting, lifting and standing. Travel and social life are more difficult and they may be disabled from work. Personal care, sexual activity and sleeping are not grossly affected, and the patient can usually be managed by conservative means  41-60% Severe Disability Pain remains the main problem in this group, but activities of daily living are affected. These patients require a detailed investigation  61-80% Crippled Back pain impinges on all aspects of the patient's life. Positive intervention is required  81-100% Bed-bound  These patients are either bed-bound or exaggerating their symptoms  Bluford FORBES Zoe DELENA Karon DELENA, et al. Surgery versus conservative management of stable thoracolumbar fracture: the PRESTO feasibility RCT. [place] ([place]): [employer]; 2021 Nov. [facility] Technology Assessment, No. 25.62.) Appendix 3, Oswestry Disability Index category descriptors. Available from: [employer]Jewel[employer]cz  Minimally Clinically Important Difference (MCID) = 12.8%  COGNITION: Overall cognitive status: Within functional limits for tasks assessed     SENSATION: Patient reports no numbness or tingling  POSTURE: decreased lumbar lordosis  PALPATION: No tenderness to palpation reported  LUMBAR ROM:   AROM eval  Flexion 12  Extension 14  Right lateral flexion  75% limited  Left lateral flexion 75% limited  Right rotation 25% limited  Left rotation 50% limited   (Blank rows = not tested)  LOWER EXTREMITY ROM: WFL for activities assessed  LOWER EXTREMITY MMT:    MMT Right eval Left eval  Hip flexion 3+/5;  pressure  3+/5; pressure   Hip extension    Hip abduction    Hip adduction    Hip internal rotation    Hip external rotation    Knee flexion 5/5 5/5  Knee extension 4+/5 4+/5  Ankle dorsiflexion 4/5 4/5  Ankle plantarflexion    Ankle inversion    Ankle eversion     (Blank rows = not tested)  LUMBAR SPECIAL TESTS:  Not tested due to surgical condition  GAIT: Assistive device utilized: Quad cane small base Level of assistance: Modified independence Comments: decreased stride length   TREATMENT DATE:                                                                                                                                                                 11/07/23 EXERCISE LOG  Exercise Repetitions and Resistance Comments  Nustep  L3 x 15 minutes                    Blank cell = exercise not performed today    PATIENT EDUCATION:  Education details: Plan of care, HEP, benefits of exercise, anatomy, expectations following spinal fusion, healing, and goals for physical therapy Person educated: Patient Education method: Explanation Education comprehension: verbalized understanding  HOME EXERCISE PROGRAM:   ASSESSMENT:  CLINICAL IMPRESSION: Patient is a 68 y.o. female who was seen today for physical therapy evaluation and treatment for lumbar pain and stiffness secondary to a T10-S1 laminectomy and fusion on 07/25/23. She presented with low pain severity and irritability with none of today's assessments aggravating her familiar symptoms. However, she exhibited reduced lumbar mobility and hip flexor strength. Recommend that she continue with skilled physical therapy to address her impairments to return to her prior  level of function.    OBJECTIVE IMPAIRMENTS: Abnormal gait, decreased activity tolerance, decreased mobility, decreased ROM, decreased strength, hypomobility, postural dysfunction, and pain.   ACTIVITY LIMITATIONS: lifting, bending, stairs, transfers, and locomotion level  PARTICIPATION LIMITATIONS: community activity and yard work  PERSONAL FACTORS: 1 comorbidity: Hypertension are also affecting patient's functional outcome.   REHAB POTENTIAL: Good  CLINICAL DECISION MAKING: Stable/uncomplicated  EVALUATION COMPLEXITY: Low   GOALS: Goals reviewed with patient? Yes  LONG TERM GOALS: Target date: 12/05/23  Patient will be independent with her HEP. Baseline:  Goal status: INITIAL  2.  Patient will be able to safely ambulate for at least 80 feet without an assistive device for improved household mobility. Baseline:  Goal status: INITIAL  3.  Patient will be able to navigate at least 4 steps with a reciprocal pattern for improved household mobility. Baseline:  Goal status: INITIAL  4.  Patient will be able to transfer from sitting to standing without external support for improved independence. Baseline: Patient required upper extremity  support for sit to stand transfers Goal status: INITIAL  5.  Patient will improve her ODI score to 3/50 or less for improved perceived function with her daily activities. Baseline:  Goal status: INITIAL  PLAN:  PT FREQUENCY: 2x/week  PT DURATION: 4 weeks  PLANNED INTERVENTIONS: 97164- PT Re-evaluation, 97750- Physical Performance Testing, 97110-Therapeutic exercises, 97530- Therapeutic activity, [id number]- Neuromuscular re-education, 97535- Self Care, 02859- Manual therapy, [phone]- Gait training, [phone]- Electrical stimulation (unattended), Patient/Family education, Balance training, Stair training, Joint mobilization, Spinal mobilization, Cryotherapy, and Moist heat.  PLAN FOR NEXT SESSION: Nustep, lumbar and lower extremity strengthening, gait  training, and balance interventions   Lacinda JAYSON Fass, PT 11/07/2023, 12:53 PM

## 2023-11-09 ENCOUNTER — Ambulatory Visit

## 2023-11-09 DIAGNOSIS — M6281 Muscle weakness (generalized): Secondary | ICD-10-CM | POA: Diagnosis not present

## 2023-11-09 DIAGNOSIS — M5459 Other low back pain: Secondary | ICD-10-CM | POA: Diagnosis not present

## 2023-11-09 NOTE — Therapy (Signed)
 OUTPATIENT PHYSICAL THERAPY THORACOLUMBAR TREATMENT   Patient Name: Veronica Gomez MRN: [id number] DOB:[birth year], 68 y.o., female Today's Date: 11/09/2023  END OF SESSION:  PT End of Session - 11/09/23 0852     Visit Number 2    Number of Visits 8    Date for PT Re-Evaluation 01/11/24    PT Start Time 0845    PT Stop Time 0931    PT Time Calculation (min) 46 min    Activity Tolerance Patient tolerated treatment well    Behavior During Therapy [facility] for tasks assessed/performed           Past Medical History:  Diagnosis Date   CAD (coronary artery disease)    Cardiomyopathy (HCC)    GERD (gastroesophageal reflux disease)    Hyperlipidemia    Hypertension    Ischemia    Left bundle branch block (LBBB)    Past Surgical History:  Procedure Laterality Date   BACK SURGERY  07/25/2023   CERVICAL POLYPECTOMY  05/15/2008   FINGER SURGERY  05/15/2006   SHOULDER SURGERY Right 05/15/2012   TUBAL LIGATION  05/15/1986   Patient Active Problem List   Diagnosis Date Noted   Chronic bilateral low back pain without sciatica 02/03/2022   Combined forms of age-related cataract of left eye 10/15/2018   Regular astigmatism, left eye 10/15/2018   Chronic heart disease 10/08/2018   Gastroesophageal reflux disease 07/25/2017   Allergic rhinitis due to allergen 08/25/2016   Overweight (BMI 25.0-29.9) 09/14/2014   HTN (hypertension) 11/20/2013   Dyslipidemia 11/20/2013   Cardiomyopathy, ischemic 11/20/2013   LBBB (left bundle branch block) 11/20/2013    PCP: Lavell Bari LABOR, FNP  REFERRING PROVIDER: Moffo, Katelyn Rose, PA-C   REFERRING DIAG: S/P T10-Pelvis-DOS 07/25/23-Back Pain   Rationale for Evaluation and Treatment: Rehabilitation  THERAPY DIAG:  Other low back pain  Muscle weakness (generalized)  ONSET DATE: 07/25/23  SUBJECTIVE:                                                                                                                                                                                            SUBJECTIVE STATEMENT: Patient reports that she is a little sore. She notes that she did not sleep well and did a lot of standing yesterday.   PERTINENT HISTORY:  Hypertension  PAIN:  Are you having pain? Yes: NPRS scale: Current: 1/10 Best: 0/10 Worst: 2/10 Pain location: low back Pain description: soreness Aggravating factors: prolonged standing (1-1.5 hours)  Relieving factors: rest  PRECAUTIONS: Back; gradually progress to lifting 40 pounds  RED FLAGS: None   WEIGHT BEARING RESTRICTIONS: No  FALLS:  Has  patient fallen in last 6 months? No  LIVING ENVIRONMENT: Lives with: lives with their spouse Lives in: House/apartment Stairs: Yes: External: 6 steps; can reach both; step to pattern Has following equipment at home: Quad cane small base  OCCUPATION: retired  PLOF: Independent  PATIENT GOALS: be able to go up down steps easier, improved ease with transfers, be able to travel, and be able to pick up things from the floor   NEXT MD VISIT: 01/18/24  OBJECTIVE:  Note: Objective measures were completed at Evaluation unless otherwise noted.  PATIENT SURVEYS:  Modified Oswestry:  MODIFIED OSWESTRY DISABILITY SCALE  Date: 11/07/23 Score  Pain intensity 0 = I can tolerate the pain I have without having to use pain medication.  2. Personal care (washing, dressing, etc.) 0 =  I can take care of myself normally without causing increased pain.  3. Lifting 1 = I can lift heavy weights, but it causes increased pain.  4. Walking 0 = Pain does not prevent me from walking any distance  5. Sitting 1 =  I can only sit in my favorite chair as long as I like.  6. Standing 1 =  I can stand as long as I want but, it increases my pain.  7. Sleeping 0 = Pain does not prevent me from sleeping well.  8. Social Life 0 = My social life is normal and does not increase my pain.  9. Traveling 3 = My pain restricts my travel over 1 hour  10. Employment/  Homemaking 2 = I can perform most of my homemaking/job duties, but pain prevents me from performing more physically stressful activities (eg, lifting, vacuuming).  Total 8/50   Interpretation of scores: Score Category Description  0-20% Minimal Disability The patient can cope with most living activities. Usually no treatment is indicated apart from advice on lifting, sitting and exercise  21-40% Moderate Disability The patient experiences more pain and difficulty with sitting, lifting and standing. Travel and social life are more difficult and they may be disabled from work. Personal care, sexual activity and sleeping are not grossly affected, and the patient can usually be managed by conservative means  41-60% Severe Disability Pain remains the main problem in this group, but activities of daily living are affected. These patients require a detailed investigation  61-80% Crippled Back pain impinges on all aspects of the patient's life. Positive intervention is required  81-100% Bed-bound  These patients are either bed-bound or exaggerating their symptoms  Bluford FORBES Zoe DELENA Karon DELENA, et al. Surgery versus conservative management of stable thoracolumbar fracture: the PRESTO feasibility RCT. [place] ([place]): [employer]; 2021 Nov. [facility] Technology Assessment, No. 25.62.) Appendix 3, Oswestry Disability Index category descriptors. Available from: [employer]el[employer].cz  Minimally Clinically Important Difference (MCID) = 12.8%  COGNITION: Overall cognitive status: Within functional limits for tasks assessed     SENSATION: Patient reports no numbness or tingling  POSTURE: decreased lumbar lordosis  PALPATION: No tenderness to palpation reported  LUMBAR ROM:   AROM eval  Flexion 12  Extension 14  Right lateral flexion 75% limited  Left lateral flexion 75% limited  Right rotation 25% limited  Left rotation 50% limited   (Blank rows = not  tested)  LOWER EXTREMITY ROM: WFL for activities assessed  LOWER EXTREMITY MMT:    MMT Right eval Left eval  Hip flexion 3+/5; pressure  3+/5; pressure   Hip extension    Hip abduction    Hip adduction    Hip  internal rotation    Hip external rotation    Knee flexion 5/5 5/5  Knee extension 4+/5 4+/5  Ankle dorsiflexion 4/5 4/5  Ankle plantarflexion    Ankle inversion    Ankle eversion     (Blank rows = not tested)  LUMBAR SPECIAL TESTS:  Not tested due to surgical condition  GAIT: Assistive device utilized: Quad cane small base Level of assistance: Modified independence Comments: decreased stride length   TREATMENT DATE:                                                                                                                                                                 11/09/23 EXERCISE LOG  Exercise Repetitions and Resistance Comments  Nustep  L3 x 15 minutes   SLR w/ hip ABD  15 reps each    Glute sets  10 reps w/ 5 second hold    Double knee to chest  15 reps BLE supported on red ball  Supine clams  Green t-band x 20 reps   Rocker board  3 minutes   Static stance on foam  2 minutes  Without UE supprt  Side stepping on foam  2 minutes  Without UE support   Blank cell = exercise not performed today                                    11/07/23 EXERCISE LOG  Exercise Repetitions and Resistance Comments  Nustep  L3 x 15 minutes                    Blank cell = exercise not performed today    PATIENT EDUCATION:  Education details: heat vs ice, HEP, and healing Person educated: Patient Education method: Explanation Education comprehension: verbalized understanding  HOME EXERCISE PROGRAM:   ASSESSMENT:  CLINICAL IMPRESSION: Patient was introduced to multiple new interventions for improved lumbar mobility and stability.  She required minimal cueing with today's new interventions for proper biomechanics needed to avoid compensatory movement  patterns.  She experienced no significant increase in her familiar symptoms with any of today's interventions.  She reported feeling sore upon the conclusion of treatment.  Recommend that she continue with skilled physical therapy to address her remaining impairments to return to her prior level of function.  OBJECTIVE IMPAIRMENTS: Abnormal gait, decreased activity tolerance, decreased mobility, decreased ROM, decreased strength, hypomobility, postural dysfunction, and pain.   ACTIVITY LIMITATIONS: lifting, bending, stairs, transfers, and locomotion level  PARTICIPATION LIMITATIONS: community activity and yard work  PERSONAL FACTORS: 1 comorbidity: Hypertension are also affecting patient's functional outcome.   REHAB POTENTIAL: Good  CLINICAL DECISION MAKING: Stable/uncomplicated  EVALUATION COMPLEXITY: Low   GOALS:  Goals reviewed with patient? Yes  LONG TERM GOALS: Target date: 12/05/23  Patient will be independent with her HEP. Baseline:  Goal status: INITIAL  2.  Patient will be able to safely ambulate for at least 80 feet without an assistive device for improved household mobility. Baseline:  Goal status: INITIAL  3.  Patient will be able to navigate at least 4 steps with a reciprocal pattern for improved household mobility. Baseline:  Goal status: INITIAL  4.  Patient will be able to transfer from sitting to standing without external support for improved independence. Baseline: Patient required upper extremity support for sit to stand transfers Goal status: INITIAL  5.  Patient will improve her ODI score to 3/50 or less for improved perceived function with her daily activities. Baseline:  Goal status: INITIAL  PLAN:  PT FREQUENCY: 2x/week  PT DURATION: 4 weeks  PLANNED INTERVENTIONS: 97164- PT Re-evaluation, 97750- Physical Performance Testing, 97110-Therapeutic exercises, 97530- Therapeutic activity, [id number]- Neuromuscular re-education, 97535- Self Care, 02859-  Manual therapy, [phone]- Gait training, [phone]- Electrical stimulation (unattended), Patient/Family education, Balance training, Stair training, Joint mobilization, Spinal mobilization, Cryotherapy, and Moist heat.  PLAN FOR NEXT SESSION: Nustep, lumbar and lower extremity strengthening, gait training, and balance interventions   Lacinda JAYSON Fass, PT 11/09/2023, 10:50 AM

## 2023-11-13 ENCOUNTER — Ambulatory Visit: Attending: Internal Medicine

## 2023-11-13 DIAGNOSIS — R293 Abnormal posture: Secondary | ICD-10-CM | POA: Diagnosis not present

## 2023-11-13 DIAGNOSIS — M6281 Muscle weakness (generalized): Secondary | ICD-10-CM | POA: Insufficient documentation

## 2023-11-13 DIAGNOSIS — M5459 Other low back pain: Secondary | ICD-10-CM | POA: Diagnosis not present

## 2023-11-13 NOTE — Therapy (Signed)
 OUTPATIENT PHYSICAL THERAPY THORACOLUMBAR TREATMENT   Patient Name: Veronica Gomez MRN: [id number] DOB:[birth year], 68 y.o., female Today's Date: 11/13/2023  END OF SESSION:  PT End of Session - 11/13/23 0853     Visit Number 3    Number of Visits 8    Date for PT Re-Evaluation 01/11/24    PT Start Time 0845    PT Stop Time 0932    PT Time Calculation (min) 47 min    Activity Tolerance Patient tolerated treatment well    Behavior During Therapy [facility] for tasks assessed/performed           Past Medical History:  Diagnosis Date   CAD (coronary artery disease)    Cardiomyopathy (HCC)    GERD (gastroesophageal reflux disease)    Hyperlipidemia    Hypertension    Ischemia    Left bundle branch block (LBBB)    Past Surgical History:  Procedure Laterality Date   BACK SURGERY  07/25/2023   CERVICAL POLYPECTOMY  05/15/2008   FINGER SURGERY  05/15/2006   SHOULDER SURGERY Right 05/15/2012   TUBAL LIGATION  05/15/1986   Patient Active Problem List   Diagnosis Date Noted   Chronic bilateral low back pain without sciatica 02/03/2022   Combined forms of age-related cataract of left eye 10/15/2018   Regular astigmatism, left eye 10/15/2018   Chronic heart disease 10/08/2018   Gastroesophageal reflux disease 07/25/2017   Allergic rhinitis due to allergen 08/25/2016   Overweight (BMI 25.0-29.9) 09/14/2014   HTN (hypertension) 11/20/2013   Dyslipidemia 11/20/2013   Cardiomyopathy, ischemic 11/20/2013   LBBB (left bundle branch block) 11/20/2013    PCP: Lavell Bari LABOR, FNP  REFERRING PROVIDER: Moffo, Katelyn Rose, PA-C   REFERRING DIAG: S/P T10-Pelvis-DOS 07/25/23-Back Pain   Rationale for Evaluation and Treatment: Rehabilitation  THERAPY DIAG:  Other low back pain  Muscle weakness (generalized)  Abnormal posture  ONSET DATE: 07/25/23  SUBJECTIVE:                                                                                                                                                                                            SUBJECTIVE STATEMENT: Pt reports 1/10 low back pain today.   PERTINENT HISTORY:  Hypertension  PAIN:  Are you having pain? Yes: NPRS scale: 1/10 Pain location: low back Pain description: soreness Aggravating factors: prolonged standing (1-1.5 hours)  Relieving factors: rest  PRECAUTIONS: Back; gradually progress to lifting 40 pounds  RED FLAGS: None   WEIGHT BEARING RESTRICTIONS: No  FALLS:  Has patient fallen in last 6 months? No  LIVING ENVIRONMENT: Lives with: lives with their spouse Lives in:  House/apartment Stairs: Yes: External: 6 steps; can reach both; step to pattern Has following equipment at home: Quad cane small base  OCCUPATION: retired  PLOF: Independent  PATIENT GOALS: be able to go up down steps easier, improved ease with transfers, be able to travel, and be able to pick up things from the floor   NEXT MD VISIT: 01/18/24  OBJECTIVE:  Note: Objective measures were completed at Evaluation unless otherwise noted.  PATIENT SURVEYS:  Modified Oswestry:  MODIFIED OSWESTRY DISABILITY SCALE  Date: 11/07/23 Score  Pain intensity 0 = I can tolerate the pain I have without having to use pain medication.  2. Personal care (washing, dressing, etc.) 0 =  I can take care of myself normally without causing increased pain.  3. Lifting 1 = I can lift heavy weights, but it causes increased pain.  4. Walking 0 = Pain does not prevent me from walking any distance  5. Sitting 1 =  I can only sit in my favorite chair as long as I like.  6. Standing 1 =  I can stand as long as I want but, it increases my pain.  7. Sleeping 0 = Pain does not prevent me from sleeping well.  8. Social Life 0 = My social life is normal and does not increase my pain.  9. Traveling 3 = My pain restricts my travel over 1 hour  10. Employment/ Homemaking 2 = I can perform most of my homemaking/job duties, but pain prevents me from  performing more physically stressful activities (eg, lifting, vacuuming).  Total 8/50   Interpretation of scores: Score Category Description  0-20% Minimal Disability The patient can cope with most living activities. Usually no treatment is indicated apart from advice on lifting, sitting and exercise  21-40% Moderate Disability The patient experiences more pain and difficulty with sitting, lifting and standing. Travel and social life are more difficult and they may be disabled from work. Personal care, sexual activity and sleeping are not grossly affected, and the patient can usually be managed by conservative means  41-60% Severe Disability Pain remains the main problem in this group, but activities of daily living are affected. These patients require a detailed investigation  61-80% Crippled Back pain impinges on all aspects of the patient's life. Positive intervention is required  81-100% Bed-bound  These patients are either bed-bound or exaggerating their symptoms  Bluford FORBES Zoe DELENA Karon DELENA, et al. Surgery versus conservative management of stable thoracolumbar fracture: the PRESTO feasibility RCT. [place]): [employer]; 2021 Nov. [facility] Technology Assessment, No. 25.62.) Appendix 3, Oswestry Disability Index category descriptors. Available from: [employer]  Minimally Clinically Important Difference (MCID) = 12.8%  COGNITION: Overall cognitive status: Within functional limits for tasks assessed     SENSATION: Patient reports no numbness or tingling  POSTURE: decreased lumbar lordosis  PALPATION: No tenderness to palpation reported  LUMBAR ROM:   AROM eval  Flexion 12  Extension 14  Right lateral flexion 75% limited  Left lateral flexion 75% limited  Right rotation 25% limited  Left rotation 50% limited   (Blank rows = not tested)  LOWER EXTREMITY ROM: WFL for activities assessed  LOWER EXTREMITY MMT:    MMT  Right eval Left eval  Hip flexion 3+/5; pressure  3+/5; pressure   Hip extension    Hip abduction    Hip adduction    Hip internal rotation    Hip external rotation    Knee flexion 5/5 5/5  Knee extension  4+/5 4+/5  Ankle dorsiflexion 4/5 4/5  Ankle plantarflexion    Ankle inversion    Ankle eversion     (Blank rows = not tested)  LUMBAR SPECIAL TESTS:  Not tested due to surgical condition  GAIT: Assistive device utilized: Quad cane small base Level of assistance: Modified independence Comments: decreased stride length   TREATMENT DATE:                                                                                                                                                                 11/13/23 EXERCISE LOG  Exercise Repetitions and Resistance Comments  Nustep  L3 x 15 minutes   SLR w/ hip ABD  15 reps each    Glute sets     Double knee to chest   BLE supported on red ball  Seated clams  Green t-band x 25 reps   Rocker board  4 minutes   Static stance on foam  2.5 minutes  Without UE supprt  Side stepping on foam  4 minutes  Without UE support  Tandem Gait on foam 4 mins    Blank cell = exercise not performed today                                    11/07/23 EXERCISE LOG  Exercise Repetitions and Resistance Comments  Nustep  L3 x 15 minutes                    Blank cell = exercise not performed today    PATIENT EDUCATION:  Education details: heat vs ice, HEP, and healing Person educated: Patient Education method: Explanation Education comprehension: verbalized understanding  HOME EXERCISE PROGRAM:   ASSESSMENT:  CLINICAL IMPRESSION: Pt arrives for today's treatment session reporting 1/10 low back pain.   Pt able to tolerate increased balance exercises today with single UE support required intermittently for safety and stability.  Pt able to transition to seated resisted clam shells today with min cues required for posture.  Pt denied any pain  at completion of today's treatment session.   OBJECTIVE IMPAIRMENTS: Abnormal gait, decreased activity tolerance, decreased mobility, decreased ROM, decreased strength, hypomobility, postural dysfunction, and pain.   ACTIVITY LIMITATIONS: lifting, bending, stairs, transfers, and locomotion level  PARTICIPATION LIMITATIONS: community activity and yard work  PERSONAL FACTORS: 1 comorbidity: Hypertension are also affecting patient's functional outcome.   REHAB POTENTIAL: Good  CLINICAL DECISION MAKING: Stable/uncomplicated  EVALUATION COMPLEXITY: Low   GOALS: Goals reviewed with patient? Yes  LONG TERM GOALS: Target date: 12/05/23  Patient will be independent with her HEP. Baseline:  Goal status: INITIAL  2.  Patient will be able to safely ambulate  for at least 80 feet without an assistive device for improved household mobility. Baseline:  Goal status: INITIAL  3.  Patient will be able to navigate at least 4 steps with a reciprocal pattern for improved household mobility. Baseline:  Goal status: INITIAL  4.  Patient will be able to transfer from sitting to standing without external support for improved independence. Baseline: Patient required upper extremity support for sit to stand transfers Goal status: INITIAL  5.  Patient will improve her ODI score to 3/50 or less for improved perceived function with her daily activities. Baseline:  Goal status: INITIAL  PLAN:  PT FREQUENCY: 2x/week  PT DURATION: 4 weeks  PLANNED INTERVENTIONS: 97164- PT Re-evaluation, 97750- Physical Performance Testing, 97110-Therapeutic exercises, 97530- Therapeutic activity, [id number]- Neuromuscular re-education, 97535- Self Care, 02859- Manual therapy, [phone]- Gait training, [phone]- Electrical stimulation (unattended), Patient/Family education, Balance training, Stair training, Joint mobilization, Spinal mobilization, Cryotherapy, and Moist heat.  PLAN FOR NEXT SESSION: Nustep, lumbar and lower  extremity strengthening, gait training, and balance interventions   Delon DELENA Gosling, PTA 11/13/2023, 10:23 AM

## 2023-11-15 ENCOUNTER — Ambulatory Visit

## 2023-11-15 DIAGNOSIS — R293 Abnormal posture: Secondary | ICD-10-CM | POA: Diagnosis not present

## 2023-11-15 DIAGNOSIS — M6281 Muscle weakness (generalized): Secondary | ICD-10-CM

## 2023-11-15 DIAGNOSIS — M5459 Other low back pain: Secondary | ICD-10-CM

## 2023-11-15 NOTE — Therapy (Signed)
 OUTPATIENT PHYSICAL THERAPY THORACOLUMBAR TREATMENT   Patient Name: Veronica Gomez MRN: [id number] DOB:January 03, 1956, 68 y.o., female Today's Date: 11/15/2023  END OF SESSION:  PT End of Session - 11/15/23 0851     Visit Number 4    Number of Visits 8    Date for PT Re-Evaluation 01/11/24    PT Start Time 0845    PT Stop Time 0931    PT Time Calculation (min) 46 min    Activity Tolerance Patient tolerated treatment well    Behavior During Therapy [facility] for tasks assessed/performed           Past Medical History:  Diagnosis Date   CAD (coronary artery disease)    Cardiomyopathy (HCC)    GERD (gastroesophageal reflux disease)    Hyperlipidemia    Hypertension    Ischemia    Left bundle branch block (LBBB)    Past Surgical History:  Procedure Laterality Date   BACK SURGERY  07/25/2023   CERVICAL POLYPECTOMY  05/15/2008   FINGER SURGERY  05/15/2006   SHOULDER SURGERY Right 05/15/2012   TUBAL LIGATION  05/15/1986   Patient Active Problem List   Diagnosis Date Noted   Chronic bilateral low back pain without sciatica 02/03/2022   Combined forms of age-related cataract of left eye 10/15/2018   Regular astigmatism, left eye 10/15/2018   Chronic heart disease 10/08/2018   Gastroesophageal reflux disease 07/25/2017   Allergic rhinitis due to allergen 08/25/2016   Overweight (BMI 25.0-29.9) 09/14/2014   HTN (hypertension) 11/20/2013   Dyslipidemia 11/20/2013   Cardiomyopathy, ischemic 11/20/2013   LBBB (left bundle branch block) 11/20/2013    PCP: Lavell Bari LABOR, FNP  REFERRING PROVIDER: Moffo, Katelyn Rose, PA-C   REFERRING DIAG: S/P T10-Pelvis-DOS 07/25/23-Back Pain   Rationale for Evaluation and Treatment: Rehabilitation  THERAPY DIAG:  Other low back pain  Muscle weakness (generalized)  Abnormal posture  ONSET DATE: 07/25/23  SUBJECTIVE:                                                                                                                                                                                            SUBJECTIVE STATEMENT: Pt reports 1/10 low back pain today.   PERTINENT HISTORY:  Hypertension  PAIN:  Are you having pain? Yes: NPRS scale: 1/10 Pain location: low back Pain description: soreness Aggravating factors: prolonged standing (1-1.5 hours)  Relieving factors: rest  PRECAUTIONS: Back; gradually progress to lifting 40 pounds  RED FLAGS: None   WEIGHT BEARING RESTRICTIONS: No  FALLS:  Has patient fallen in last 6 months? No  LIVING ENVIRONMENT: Lives with: lives with their spouse Lives in:  House/apartment Stairs: Yes: External: 6 steps; can reach both; step to pattern Has following equipment at home: Quad cane small base  OCCUPATION: retired  PLOF: Independent  PATIENT GOALS: be able to go up down steps easier, improved ease with transfers, be able to travel, and be able to pick up things from the floor   NEXT MD VISIT: 01/18/24  OBJECTIVE:  Note: Objective measures were completed at Evaluation unless otherwise noted.  PATIENT SURVEYS:  Modified Oswestry:  MODIFIED OSWESTRY DISABILITY SCALE  Date: 11/07/23 Score  Pain intensity 0 = I can tolerate the pain I have without having to use pain medication.  2. Personal care (washing, dressing, etc.) 0 =  I can take care of myself normally without causing increased pain.  3. Lifting 1 = I can lift heavy weights, but it causes increased pain.  4. Walking 0 = Pain does not prevent me from walking any distance  5. Sitting 1 =  I can only sit in my favorite chair as long as I like.  6. Standing 1 =  I can stand as long as I want but, it increases my pain.  7. Sleeping 0 = Pain does not prevent me from sleeping well.  8. Social Life 0 = My social life is normal and does not increase my pain.  9. Traveling 3 = My pain restricts my travel over 1 hour  10. Employment/ Homemaking 2 = I can perform most of my homemaking/job duties, but pain prevents me from  performing more physically stressful activities (eg, lifting, vacuuming).  Total 8/50   Interpretation of scores: Score Category Description  0-20% Minimal Disability The patient can cope with most living activities. Usually no treatment is indicated apart from advice on lifting, sitting and exercise  21-40% Moderate Disability The patient experiences more pain and difficulty with sitting, lifting and standing. Travel and social life are more difficult and they may be disabled from work. Personal care, sexual activity and sleeping are not grossly affected, and the patient can usually be managed by conservative means  41-60% Severe Disability Pain remains the main problem in this group, but activities of daily living are affected. These patients require a detailed investigation  61-80% Crippled Back pain impinges on all aspects of the patient's life. Positive intervention is required  81-100% Bed-bound  These patients are either bed-bound or exaggerating their symptoms  Bluford FORBES Zoe DELENA Karon DELENA, et al. Surgery versus conservative management of stable thoracolumbar fracture: the PRESTO feasibility RCT. [place] ([place]): [employer]; 2021 Nov. [facility] Technology Assessment, No. 25.62.) Appendix 3, Oswestry Disability Index category descriptors. Available from: [employer]cz  Minimally Clinically Important Difference (MCID) = 12.8%  COGNITION: Overall cognitive status: Within functional limits for tasks assessed     SENSATION: Patient reports no numbness or tingling  POSTURE: decreased lumbar lordosis  PALPATION: No tenderness to palpation reported  LUMBAR ROM:   AROM eval  Flexion 12  Extension 14  Right lateral flexion 75% limited  Left lateral flexion 75% limited  Right rotation 25% limited  Left rotation 50% limited   (Blank rows = not tested)  LOWER EXTREMITY ROM: WFL for activities assessed  LOWER EXTREMITY MMT:    MMT  Right eval Left eval  Hip flexion 3+/5; pressure  3+/5; pressure   Hip extension    Hip abduction    Hip adduction    Hip internal rotation    Hip external rotation    Knee flexion 5/5 5/5  Knee extension  4+/5 4+/5  Ankle dorsiflexion 4/5 4/5  Ankle plantarflexion    Ankle inversion    Ankle eversion     (Blank rows = not tested)  LUMBAR SPECIAL TESTS:  Not tested due to surgical condition  GAIT: Assistive device utilized: Quad cane small base Level of assistance: Modified independence Comments: decreased stride length   TREATMENT DATE:                                                                                                                                                                 11/13/23 EXERCISE LOG  Exercise Repetitions and Resistance Comments  Nustep  L3 x 15 minutes   SLR w/ hip ABD  15 reps each    Glute sets     Forward Step Ups 8 box alternating x 3 mins BLE supported on red ball  Seated clams  Green t-band x 25 reps   Rocker board  4 minutes   Static stance on BOSU 3 minutes  Without UE supprt  Side stepping on foam  4 minutes  Without UE support  Tandem Gait on foam 4 mins    Blank cell = exercise not performed today                                    11/07/23 EXERCISE LOG  Exercise Repetitions and Resistance Comments  Nustep  L3 x 15 minutes                    Blank cell = exercise not performed today    PATIENT EDUCATION:  Education details: heat vs ice, HEP, and healing Person educated: Patient Education method: Explanation Education comprehension: verbalized understanding  HOME EXERCISE PROGRAM:   ASSESSMENT:  CLINICAL IMPRESSION: Pt arrives for today's treatment reporting minimal soreness today. Pt able to decrease ODI score to 3/50 today, meeting her LTG.  Pt able to ambulate greater than 80 ft without use of AD, but continues to use [facility] for peace of mind.  Pt also able to perform STS transfers without UE support with  good technique and control.  Pt also able to navigate four stairs in reciprocal pattern and no use of hand rail.  Pt able to tolerate increased time or reps with several previously performed exercises.  Pt introduced to forward step up onto 8 box with min cues for alternating sequencing.  Pt denied any pain at completion of today's treatment session.   OBJECTIVE IMPAIRMENTS: Abnormal gait, decreased activity tolerance, decreased mobility, decreased ROM, decreased strength, hypomobility, postural dysfunction, and pain.   ACTIVITY LIMITATIONS: lifting, bending, stairs, transfers, and locomotion level  PARTICIPATION LIMITATIONS: community activity and yard work  PERSONAL FACTORS: 1  comorbidity: Hypertension are also affecting patient's functional outcome.   REHAB POTENTIAL: Good  CLINICAL DECISION MAKING: Stable/uncomplicated  EVALUATION COMPLEXITY: Low   GOALS: Goals reviewed with patient? Yes  LONG TERM GOALS: Target date: 12/05/23  Patient will be independent with her HEP. Baseline:  Goal status: IN PROGRESS  2.  Patient will be able to safely ambulate for at least 80 feet without an assistive device for improved household mobility. Baseline:  Goal status: MET  3.  Patient will be able to navigate at least 4 steps with a reciprocal pattern for improved household mobility. Baseline:  Goal status: MET  4.  Patient will be able to transfer from sitting to standing without external support for improved independence. Baseline: Patient required upper extremity support for sit to stand transfers Goal status: MET  5.  Patient will improve her ODI score to 3/50 or less for improved perceived function with her daily activities. Baseline:7/3:3/50 Goal status: MET  PLAN:  PT FREQUENCY: 2x/week  PT DURATION: 4 weeks  PLANNED INTERVENTIONS: 97164- PT Re-evaluation, 97750- Physical Performance Testing, 97110-Therapeutic exercises, 97530- Therapeutic activity, 97112- Neuromuscular  re-education, 97535- Self Care, 02859- Manual therapy, [phone]- Gait training, [phone]- Electrical stimulation (unattended), Patient/Family education, Balance training, Stair training, Joint mobilization, Spinal mobilization, Cryotherapy, and Moist heat.  PLAN FOR NEXT SESSION: Nustep, lumbar and lower extremity strengthening, gait training, and balance interventions   Delon DELENA Gosling, PTA 11/15/2023, 10:12 AM

## 2023-11-19 ENCOUNTER — Ambulatory Visit: Admitting: *Deleted

## 2023-11-19 DIAGNOSIS — M6281 Muscle weakness (generalized): Secondary | ICD-10-CM

## 2023-11-19 DIAGNOSIS — M5459 Other low back pain: Secondary | ICD-10-CM | POA: Diagnosis not present

## 2023-11-19 DIAGNOSIS — R293 Abnormal posture: Secondary | ICD-10-CM | POA: Diagnosis not present

## 2023-11-19 NOTE — Therapy (Signed)
 OUTPATIENT PHYSICAL THERAPY THORACOLUMBAR TREATMENT   Patient Name: Veronica Gomez MRN: [id number] DOB:17-Jun-1955, 68 y.o., female Today's Date: 11/19/2023  END OF SESSION:  PT End of Session - 11/19/23 0939     Visit Number 5    Number of Visits 8    Date for PT Re-Evaluation 01/11/24    PT Start Time 0845            Past Medical History:  Diagnosis Date   CAD (coronary artery disease)    Cardiomyopathy (HCC)    GERD (gastroesophageal reflux disease)    Hyperlipidemia    Hypertension    Ischemia    Left bundle branch block (LBBB)    Past Surgical History:  Procedure Laterality Date   BACK SURGERY  07/25/2023   CERVICAL POLYPECTOMY  05/15/2008   FINGER SURGERY  05/15/2006   SHOULDER SURGERY Right 05/15/2012   TUBAL LIGATION  05/15/1986   Patient Active Problem List   Diagnosis Date Noted   Chronic bilateral low back pain without sciatica 02/03/2022   Combined forms of age-related cataract of left eye 10/15/2018   Regular astigmatism, left eye 10/15/2018   Chronic heart disease 10/08/2018   Gastroesophageal reflux disease 07/25/2017   Allergic rhinitis due to allergen 08/25/2016   Overweight (BMI 25.0-29.9) 09/14/2014   HTN (hypertension) 11/20/2013   Dyslipidemia 11/20/2013   Cardiomyopathy, ischemic 11/20/2013   LBBB (left bundle branch block) 11/20/2013    PCP: Lavell Bari LABOR, FNP  REFERRING PROVIDER: Moffo, Katelyn Rose, PA-C   REFERRING DIAG: S/P T10-Pelvis-DOS 07/25/23-Back Pain   Rationale for Evaluation and Treatment: Rehabilitation  THERAPY DIAG:  Other low back pain  Muscle weakness (generalized)  Abnormal posture  ONSET DATE: 07/25/23  SUBJECTIVE:                                                                                                                                                                                           SUBJECTIVE STATEMENT: Pt reports 2/10 low back pain today, but with pain into RT groin and down the  leg  PERTINENT HISTORY:  Hypertension  PAIN:  Are you having pain? Yes: NPRS scale: 2/10 Pain location: low back Pain description: soreness Aggravating factors: prolonged standing (1-1.5 hours)  Relieving factors: rest  PRECAUTIONS: Back; gradually progress to lifting 40 pounds  RED FLAGS: None   WEIGHT BEARING RESTRICTIONS: No  FALLS:  Has patient fallen in last 6 months? No  LIVING ENVIRONMENT: Lives with: lives with their spouse Lives in: House/apartment Stairs: Yes: External: 6 steps; can reach both; step to pattern Has following equipment at home: Quad cane small base  OCCUPATION: retired  PLOF: Independent  PATIENT GOALS: be able to go up down steps easier, improved ease with transfers, be able to travel, and be able to pick up things from the floor   NEXT MD VISIT: 01/18/24  OBJECTIVE:  Note: Objective measures were completed at Evaluation unless otherwise noted.  PATIENT SURVEYS:  Modified Oswestry:  MODIFIED OSWESTRY DISABILITY SCALE  Date: 11/07/23 Score  Pain intensity 0 = I can tolerate the pain I have without having to use pain medication.  2. Personal care (washing, dressing, etc.) 0 =  I can take care of myself normally without causing increased pain.  3. Lifting 1 = I can lift heavy weights, but it causes increased pain.  4. Walking 0 = Pain does not prevent me from walking any distance  5. Sitting 1 =  I can only sit in my favorite chair as long as I like.  6. Standing 1 =  I can stand as long as I want but, it increases my pain.  7. Sleeping 0 = Pain does not prevent me from sleeping well.  8. Social Life 0 = My social life is normal and does not increase my pain.  9. Traveling 3 = My pain restricts my travel over 1 hour  10. Employment/ Homemaking 2 = I can perform most of my homemaking/job duties, but pain prevents me from performing more physically stressful activities (eg, lifting, vacuuming).  Total 8/50   Interpretation of scores: Score  Category Description  0-20% Minimal Disability The patient can cope with most living activities. Usually no treatment is indicated apart from advice on lifting, sitting and exercise  21-40% Moderate Disability The patient experiences more pain and difficulty with sitting, lifting and standing. Travel and social life are more difficult and they may be disabled from work. Personal care, sexual activity and sleeping are not grossly affected, and the patient can usually be managed by conservative means  41-60% Severe Disability Pain remains the main problem in this group, but activities of daily living are affected. These patients require a detailed investigation  61-80% Crippled Back pain impinges on all aspects of the patient's life. Positive intervention is required  81-100% Bed-bound  These patients are either bed-bound or exaggerating their symptoms  Bluford FORBES Zoe DELENA Karon DELENA, et al. Surgery versus conservative management of stable thoracolumbar fracture: the PRESTO feasibility RCT. [place] ([place]): [employer]; 2021 Nov. [facility] [employer] Assessment, No. 25.62.) Appendix 3, Oswestry Disability Index category descriptors. Available from: [employer]  Minimally Clinically Important Difference (MCID) = 12.8%  COGNITION: Overall cognitive status: Within functional limits for tasks assessed     SENSATION: Patient reports no numbness or tingling  POSTURE: decreased lumbar lordosis  PALPATION: No tenderness to palpation reported  LUMBAR ROM:   AROM eval  Flexion 12  Extension 14  Right lateral flexion 75% limited  Left lateral flexion 75% limited  Right rotation 25% limited  Left rotation 50% limited   (Blank rows = not tested)  LOWER EXTREMITY ROM: WFL for activities assessed  LOWER EXTREMITY MMT:    MMT Right eval Left eval  Hip flexion 3+/5; pressure  3+/5; pressure   Hip extension    Hip abduction    Hip adduction    Hip  internal rotation    Hip external rotation    Knee flexion 5/5 5/5  Knee extension 4+/5 4+/5  Ankle dorsiflexion 4/5 4/5  Ankle plantarflexion    Ankle inversion    Ankle eversion     (Blank  rows = not tested)  LUMBAR SPECIAL TESTS:  Not tested due to surgical condition  GAIT: Assistive device utilized: Quad cane small base Level of assistance: Modified independence Comments: decreased stride length   TREATMENT DATE:                                                                                                                                                                 11/19/23 EXERCISE LOG     LB fusion 07-25-23  Exercise Repetitions and Resistance Comments  Nustep  L3 x 15 minutes   XTS blue Rows/ pulldowns 2x10 each with 5 sec holds   SLR w/ hip ABD     Glute sets     Forward Step Ups  BLE supported on red ball  Seated clams     Rocker board     Static stance on BOSU  Without UE supprt  Side stepping on foam   Without UE support  Tandem Gait on foam     Blank cell = exercise not performed today  Manual STW to RT glute SIJ in LT side lying x 13 mins IFC x 15 mins 80-150hz  to RT SIJ and glute in hook lying                                   11/07/23 EXERCISE LOG  Exercise Repetitions and Resistance Comments  Nustep  L3 x 15 minutes                    Blank cell = exercise not performed today    PATIENT EDUCATION:  Education details: heat vs ice, HEP, and healing Person educated: Patient Education method: Explanation Education comprehension: verbalized understanding  HOME EXERCISE PROGRAM:   ASSESSMENT:  CLINICAL IMPRESSION: Pt arrives for today's treatment reporting Rt groin pain and pain radiating into RT leg. Rx focused on core exs with XTS as well as STW to RT glute and SIJ with notable tenderness. IFC to RT glute/SIJ in hook lying end of session. Some decreased pain end of ession.       OBJECTIVE IMPAIRMENTS: Abnormal gait, decreased activity  tolerance, decreased mobility, decreased ROM, decreased strength, hypomobility, postural dysfunction, and pain.   ACTIVITY LIMITATIONS: lifting, bending, stairs, transfers, and locomotion level  PARTICIPATION LIMITATIONS: community activity and yard work  PERSONAL FACTORS: 1 comorbidity: Hypertension are also affecting patient's functional outcome.   REHAB POTENTIAL: Good  CLINICAL DECISION MAKING: Stable/uncomplicated  EVALUATION COMPLEXITY: Low   GOALS: Goals reviewed with patient? Yes  LONG TERM GOALS: Target date: 12/05/23  Patient will be independent with her HEP. Baseline:  Goal status: IN PROGRESS  2.  Patient will be able to safely ambulate  for at least 80 feet without an assistive device for improved household mobility. Baseline:  Goal status: MET  3.  Patient will be able to navigate at least 4 steps with a reciprocal pattern for improved household mobility. Baseline:  Goal status: MET  4.  Patient will be able to transfer from sitting to standing without external support for improved independence. Baseline: Patient required upper extremity support for sit to stand transfers Goal status: MET  5.  Patient will improve her ODI score to 3/50 or less for improved perceived function with her daily activities. Baseline:7/3:3/50 Goal status: MET  PLAN:  PT FREQUENCY: 2x/week  PT DURATION: 4 weeks  PLANNED INTERVENTIONS: 97164- PT Re-evaluation, 97750- Physical Performance Testing, 97110-Therapeutic exercises, 97530- Therapeutic activity, 97112- Neuromuscular re-education, 97535- Self Care, 02859- Manual therapy, [phone]- Gait training, [phone]- Electrical stimulation (unattended), Patient/Family education, Balance training, Stair training, Joint mobilization, Spinal mobilization, Cryotherapy, and Moist heat.  PLAN FOR NEXT SESSION: Nustep, lumbar and lower extremity strengthening, gait training, and balance interventions   Taytum Scheck,CHRIS, PTA 11/19/2023, 1:04 PM

## 2023-11-21 ENCOUNTER — Ambulatory Visit

## 2023-11-21 DIAGNOSIS — R293 Abnormal posture: Secondary | ICD-10-CM | POA: Diagnosis not present

## 2023-11-21 DIAGNOSIS — M5459 Other low back pain: Secondary | ICD-10-CM

## 2023-11-21 DIAGNOSIS — M6281 Muscle weakness (generalized): Secondary | ICD-10-CM

## 2023-11-21 NOTE — Therapy (Signed)
 OUTPATIENT PHYSICAL THERAPY THORACOLUMBAR TREATMENT   Patient Name: Veronica Gomez MRN: [id number] DOB:[birth year], 68 y.o., female Today's Date: 11/21/2023  END OF SESSION:  PT End of Session - 11/21/23 0848     Visit Number 6    Number of Visits 8    Date for PT Re-Evaluation 01/11/24    Authorization - Number of Visits 12    PT Start Time 0845    PT Stop Time 0947    PT Time Calculation (min) 62 min            Past Medical History:  Diagnosis Date   CAD (coronary artery disease)    Cardiomyopathy (HCC)    GERD (gastroesophageal reflux disease)    Hyperlipidemia    Hypertension    Ischemia    Left bundle branch block (LBBB)    Past Surgical History:  Procedure Laterality Date   BACK SURGERY  07/25/2023   CERVICAL POLYPECTOMY  05/15/2008   FINGER SURGERY  05/15/2006   SHOULDER SURGERY Right 05/15/2012   TUBAL LIGATION  05/15/1986   Patient Active Problem List   Diagnosis Date Noted   Chronic bilateral low back pain without sciatica 02/03/2022   Combined forms of age-related cataract of left eye 10/15/2018   Regular astigmatism, left eye 10/15/2018   Chronic heart disease 10/08/2018   Gastroesophageal reflux disease 07/25/2017   Allergic rhinitis due to allergen 08/25/2016   Overweight (BMI 25.0-29.9) 09/14/2014   HTN (hypertension) 11/20/2013   Dyslipidemia 11/20/2013   Cardiomyopathy, ischemic 11/20/2013   LBBB (left bundle branch block) 11/20/2013    PCP: Lavell Bari LABOR, FNP  REFERRING PROVIDER: Moffo, Katelyn Rose, PA-C   REFERRING DIAG: S/P T10-Pelvis-DOS 07/25/23-Back Pain   Rationale for Evaluation and Treatment: Rehabilitation  THERAPY DIAG:  Other low back pain  Muscle weakness (generalized)  Abnormal posture  ONSET DATE: 07/25/23  SUBJECTIVE:                                                                                                                                                                                            SUBJECTIVE STATEMENT: Pt reports 2/10 low back pain today, radiating into right thigh and groin.   PERTINENT HISTORY:  Hypertension  PAIN:  Are you having pain? Yes: NPRS scale: 2/10 Pain location: low back Pain description: soreness Aggravating factors: prolonged standing (1-1.5 hours)  Relieving factors: rest  PRECAUTIONS: Back; gradually progress to lifting 40 pounds  RED FLAGS: None   WEIGHT BEARING RESTRICTIONS: No  FALLS:  Has patient fallen in last 6 months? No  LIVING ENVIRONMENT: Lives with: lives with their spouse Lives in: House/apartment Stairs: Yes:  External: 6 steps; can reach both; step to pattern Has following equipment at home: Quad cane small base  OCCUPATION: retired  PLOF: Independent  PATIENT GOALS: be able to go up down steps easier, improved ease with transfers, be able to travel, and be able to pick up things from the floor   NEXT MD VISIT: 01/18/24  OBJECTIVE:  Note: Objective measures were completed at Evaluation unless otherwise noted.  PATIENT SURVEYS:  Modified Oswestry:  MODIFIED OSWESTRY DISABILITY SCALE  Date: 11/07/23 Score  Pain intensity 0 = I can tolerate the pain I have without having to use pain medication.  2. Personal care (washing, dressing, etc.) 0 =  I can take care of myself normally without causing increased pain.  3. Lifting 1 = I can lift heavy weights, but it causes increased pain.  4. Walking 0 = Pain does not prevent me from walking any distance  5. Sitting 1 =  I can only sit in my favorite chair as long as I like.  6. Standing 1 =  I can stand as long as I want but, it increases my pain.  7. Sleeping 0 = Pain does not prevent me from sleeping well.  8. Social Life 0 = My social life is normal and does not increase my pain.  9. Traveling 3 = My pain restricts my travel over 1 hour  10. Employment/ Homemaking 2 = I can perform most of my homemaking/job duties, but pain prevents me from performing more physically  stressful activities (eg, lifting, vacuuming).  Total 8/50   Interpretation of scores: Score Category Description  0-20% Minimal Disability The patient can cope with most living activities. Usually no treatment is indicated apart from advice on lifting, sitting and exercise  21-40% Moderate Disability The patient experiences more pain and difficulty with sitting, lifting and standing. Travel and social life are more difficult and they may be disabled from work. Personal care, sexual activity and sleeping are not grossly affected, and the patient can usually be managed by conservative means  41-60% Severe Disability Pain remains the main problem in this group, but activities of daily living are affected. These patients require a detailed investigation  61-80% Crippled Back pain impinges on all aspects of the patient's life. Positive intervention is required  81-100% Bed-bound  These patients are either bed-bound or exaggerating their symptoms  Bluford FORBES Zoe DELENA Karon DELENA, et al. Surgery versus conservative management of stable thoracolumbar fracture: the PRESTO feasibility RCT. [place] ([place]): [employer]; 2021 Nov. [facility] Technology Assessment, No. 25.62.) Appendix 3, Oswestry Disability Index category descriptors. Available from: [employer]cz  Minimally Clinically Important Difference (MCID) = 12.8%  COGNITION: Overall cognitive status: Within functional limits for tasks assessed     SENSATION: Patient reports no numbness or tingling  POSTURE: decreased lumbar lordosis  PALPATION: No tenderness to palpation reported  LUMBAR ROM:   AROM eval  Flexion 12  Extension 14  Right lateral flexion 75% limited  Left lateral flexion 75% limited  Right rotation 25% limited  Left rotation 50% limited   (Blank rows = not tested)  LOWER EXTREMITY ROM: WFL for activities assessed  LOWER EXTREMITY MMT:    MMT Right eval Left eval  Hip  flexion 3+/5; pressure  3+/5; pressure   Hip extension    Hip abduction    Hip adduction    Hip internal rotation    Hip external rotation    Knee flexion 5/5 5/5  Knee extension 4+/5 4+/5  Ankle dorsiflexion 4/5 4/5  Ankle plantarflexion    Ankle inversion    Ankle eversion     (Blank rows = not tested)  LUMBAR SPECIAL TESTS:  Not tested due to surgical condition  GAIT: Assistive device utilized: Quad cane small base Level of assistance: Modified independence Comments: decreased stride length   TREATMENT DATE:                                                                                                                                                                 11/21/23 EXERCISE LOG     LB fusion 07-25-23  Exercise Repetitions and Resistance Comments  Nustep  L4 x 15 minutes   XTS blue Rows/ pulldowns 2x10 each with 5 sec holds   SLR w/ hip ABD     Glute sets     Forward Step Ups  BLE supported on red ball  Seated clams     Rocker board  4 mins   LAQs 2# x 20 reps bil   Seated Marches 2# x 20 reps bil   Seated Ham Curls    Static stance on BOSU  Without UE supprt  Side stepping on foam   Without UE support  Tandem Gait on foam     Blank cell = exercise not performed today  Manual STW to right thigh and IT band with pt in supine IFC x 15 mins 80-150hz  to RT thigh in hook lying                                   11/07/23 EXERCISE LOG  Exercise Repetitions and Resistance Comments  Nustep  L3 x 15 minutes                    Blank cell = exercise not performed today    PATIENT EDUCATION:  Education details: heat vs ice, HEP, and healing Person educated: Patient Education method: Explanation Education comprehension: verbalized understanding  HOME EXERCISE PROGRAM:   ASSESSMENT:  CLINICAL IMPRESSION: Pt arrives for today's treatment session reporting 2/10 right thigh/groin pain.  Pt reports that she felt a little better after last treatment session.   Pt introduced to seated exercises today to increase strength and function.  Pt requiring min cues for proper technique with all newly added exercises.  STW/M performed to to right thigh and IT band to decrease pain and tone.  Normal responses to estim noted upon removal.  Pt denied any pain at completion of today's treatment session.    OBJECTIVE IMPAIRMENTS: Abnormal gait, decreased activity tolerance, decreased mobility, decreased ROM, decreased strength, hypomobility, postural dysfunction, and pain.   ACTIVITY LIMITATIONS: lifting, bending, stairs, transfers, and locomotion level  PARTICIPATION LIMITATIONS: community activity and yard work  PERSONAL FACTORS: 1 comorbidity: Hypertension are also affecting patient's functional outcome.   REHAB POTENTIAL: Good  CLINICAL DECISION MAKING: Stable/uncomplicated  EVALUATION COMPLEXITY: Low   GOALS: Goals reviewed with patient? Yes  LONG TERM GOALS: Target date: 12/05/23  Patient will be independent with her HEP. Baseline:  Goal status: IN PROGRESS  2.  Patient will be able to safely ambulate for at least 80 feet without an assistive device for improved household mobility. Baseline:  Goal status: MET  3.  Patient will be able to navigate at least 4 steps with a reciprocal pattern for improved household mobility. Baseline:  Goal status: MET  4.  Patient will be able to transfer from sitting to standing without external support for improved independence. Baseline: Patient required upper extremity support for sit to stand transfers Goal status: MET  5.  Patient will improve her ODI score to 3/50 or less for improved perceived function with her daily activities. Baseline:7/3:3/50 Goal status: MET  PLAN:  PT FREQUENCY: 2x/week  PT DURATION: 4 weeks  PLANNED INTERVENTIONS: 97164- PT Re-evaluation, 97750- Physical Performance Testing, 97110-Therapeutic exercises, 97530- Therapeutic activity, 97112- Neuromuscular re-education,  97535- Self Care, 02859- Manual therapy, [phone]- Gait training, [phone]- Electrical stimulation (unattended), Patient/Family education, Balance training, Stair training, Joint mobilization, Spinal mobilization, Cryotherapy, and Moist heat.  PLAN FOR NEXT SESSION: Nustep, lumbar and lower extremity strengthening, gait training, and balance interventions   Delon DELENA Gosling, PTA 11/21/2023, 11:12 AM

## 2023-11-27 ENCOUNTER — Encounter: Payer: Self-pay | Admitting: *Deleted

## 2023-11-27 ENCOUNTER — Ambulatory Visit: Admitting: *Deleted

## 2023-11-27 DIAGNOSIS — M6281 Muscle weakness (generalized): Secondary | ICD-10-CM

## 2023-11-27 DIAGNOSIS — M5459 Other low back pain: Secondary | ICD-10-CM | POA: Diagnosis not present

## 2023-11-27 DIAGNOSIS — R293 Abnormal posture: Secondary | ICD-10-CM

## 2023-11-27 NOTE — Therapy (Signed)
 OUTPATIENT PHYSICAL THERAPY THORACOLUMBAR TREATMENT   Patient Name: Veronica Gomez MRN: [id number] DOB:03-03-1956, 68 y.o., female Today's Date: 11/27/2023  END OF SESSION:  PT End of Session - 11/27/23 0840     Visit Number 7    Number of Visits 8    Date for PT Re-Evaluation 01/11/24    Authorization - Number of Visits 12     PT Start Time 0840    PT Stop Time 0930    PT Time Calculation (min) 50 min            Past Medical History:  Diagnosis Date   CAD (coronary artery disease)    Cardiomyopathy (HCC)    GERD (gastroesophageal reflux disease)    Hyperlipidemia    Hypertension    Ischemia    Left bundle branch block (LBBB)    Past Surgical History:  Procedure Laterality Date   BACK SURGERY  07/25/2023   CERVICAL POLYPECTOMY  05/15/2008   FINGER SURGERY  05/15/2006   SHOULDER SURGERY Right 05/15/2012   TUBAL LIGATION  05/15/1986   Patient Active Problem List   Diagnosis Date Noted   Chronic bilateral low back pain without sciatica 02/03/2022   Combined forms of age-related cataract of left eye 10/15/2018   Regular astigmatism, left eye 10/15/2018   Chronic heart disease 10/08/2018   Gastroesophageal reflux disease 07/25/2017   Allergic rhinitis due to allergen 08/25/2016   Overweight (BMI 25.0-29.9) 09/14/2014   HTN (hypertension) 11/20/2013   Dyslipidemia 11/20/2013   Cardiomyopathy, ischemic 11/20/2013   LBBB (left bundle branch block) 11/20/2013    PCP: Lavell Bari LABOR, FNP  REFERRING PROVIDER: Moffo, Katelyn Rose, PA-C   REFERRING DIAG: S/P T10-Pelvis-DOS 07/25/23-Back Pain   Rationale for Evaluation and Treatment: Rehabilitation  THERAPY DIAG:  Other low back pain  Muscle weakness (generalized)  Abnormal posture  ONSET DATE: 07/25/23  SUBJECTIVE:                                                                                                                                                                                            SUBJECTIVE STATEMENT: Pt reports 1-2/10 low back pain today and radiating right thigh and groin. Go back MD Sept. Not back to all ADL's due to increased pain 5-6/10  and would like to continue with PT.  Not vacuuming yet or cleaning the tub.   PERTINENT HISTORY:  Hypertension  PAIN:  Are you having pain? Yes: NPRS scale: 2/10 Pain location: low back Pain description: soreness Aggravating factors: prolonged standing (1-1.5 hours)  Relieving factors: rest  PRECAUTIONS: Back; gradually progress to lifting 40 pounds  RED FLAGS: None  WEIGHT BEARING RESTRICTIONS: No  FALLS:  Has patient fallen in last 6 months? No  LIVING ENVIRONMENT: Lives with: lives with their spouse Lives in: House/apartment Stairs: Yes: External: 6 steps; can reach both; step to pattern Has following equipment at home: Quad cane small base  OCCUPATION: retired  PLOF: Independent  PATIENT GOALS: be able to go up down steps easier, improved ease with transfers, be able to travel, and be able to pick up things from the floor   NEXT MD VISIT: 01/18/24  OBJECTIVE:  Note: Objective measures were completed at Evaluation unless otherwise noted.  PATIENT SURVEYS:  Modified Oswestry:  MODIFIED OSWESTRY DISABILITY SCALE  Date: 11/07/23 Score  Pain intensity 0 = I can tolerate the pain I have without having to use pain medication.  2. Personal care (washing, dressing, etc.) 0 =  I can take care of myself normally without causing increased pain.  3. Lifting 1 = I can lift heavy weights, but it causes increased pain.  4. Walking 0 = Pain does not prevent me from walking any distance  5. Sitting 1 =  I can only sit in my favorite chair as long as I like.  6. Standing 1 =  I can stand as long as I want but, it increases my pain.  7. Sleeping 0 = Pain does not prevent me from sleeping well.  8. Social Life 0 = My social life is normal and does not increase my pain.  9. Traveling 3 = My pain restricts my travel  over 1 hour  10. Employment/ Homemaking 2 = I can perform most of my homemaking/job duties, but pain prevents me from performing more physically stressful activities (eg, lifting, vacuuming).  Total 8/50   Interpretation of scores: Score Category Description  0-20% Minimal Disability The patient can cope with most living activities. Usually no treatment is indicated apart from advice on lifting, sitting and exercise  21-40% Moderate Disability The patient experiences more pain and difficulty with sitting, lifting and standing. Travel and social life are more difficult and they may be disabled from work. Personal care, sexual activity and sleeping are not grossly affected, and the patient can usually be managed by conservative means  41-60% Severe Disability Pain remains the main problem in this group, but activities of daily living are affected. These patients require a detailed investigation  61-80% Crippled Back pain impinges on all aspects of the patient's life. Positive intervention is required  81-100% Bed-bound  These patients are either bed-bound or exaggerating their symptoms  Bluford FORBES Zoe DELENA Karon DELENA, et al. Surgery versus conservative management of stable thoracolumbar fracture: the PRESTO feasibility RCT. [place] ([place]): [employer]; 2021 Nov. [facility] Technology Assessment, No. 25.62.) Appendix 3, Oswestry Disability Index category descriptors. Available from: [employer]cz  Minimally Clinically Important Difference (MCID) = 12.8%  COGNITION: Overall cognitive status: Within functional limits for tasks assessed     SENSATION: Patient reports no numbness or tingling  POSTURE: decreased lumbar lordosis  PALPATION: No tenderness to palpation reported  LUMBAR ROM:   AROM eval  Flexion 12  Extension 14  Right lateral flexion 75% limited  Left lateral flexion 75% limited  Right rotation 25% limited  Left rotation 50% limited    (Blank rows = not tested)  LOWER EXTREMITY ROM: WFL for activities assessed  LOWER EXTREMITY MMT:    MMT Right eval Left eval  Hip flexion 3+/5; pressure  3+/5; pressure   Hip extension    Hip abduction  Hip adduction    Hip internal rotation    Hip external rotation    Knee flexion 5/5 5/5  Knee extension 4+/5 4+/5  Ankle dorsiflexion 4/5 4/5  Ankle plantarflexion    Ankle inversion    Ankle eversion     (Blank rows = not tested)  LUMBAR SPECIAL TESTS:  Not tested due to surgical condition  GAIT: Assistive device utilized: Quad cane small base Level of assistance: Modified independence Comments: decreased stride length   TREATMENT DATE:                                                                                                                                                                 11/27/23 EXERCISE LOG     LB fusion 07-25-23  Exercise Repetitions and Resistance Comments  Nustep     TM 1.4 MPH   x 15 mins   XTS blue Rows 4 x 10 each with AB bracing   XTS  blue EXT 2x10 with AB bracing   SLR w/ hip ABD     Glute sets     Forward Step Ups  BLE supported on red ball  Seated clams     Rocker board     LAQs 2# x 20 reps bil   Seated Marches 2# x 20 reps bil   Seated Ham Curls    Static stance on BOSU  Without UE supprt  Side stepping on foam   Without UE support  Tandem Gait on foam     Blank cell = exercise not performed today  Manual  Reviewed HEP and discussed walking program and body mechanics                                   11/07/23 EXERCISE LOG  Exercise Repetitions and Resistance Comments  Nustep  L3 x 15 minutes                    Blank cell = exercise not performed today    PATIENT EDUCATION:  Education details: heat vs ice, HEP, and healing Person educated: Patient Education method: Explanation Education comprehension: verbalized understanding  HOME EXERCISE PROGRAM:   ASSESSMENT:  CLINICAL IMPRESSION: Pt arrives for  today's treatment session reporting 2/10 right thigh/groin pain. She appears to be progressing with decreased pain and was able to perform TM today as well as core strengthening exs without any pain triggers. She does report that she hasn't returned to all ADL's yet due to increased pain 4-5/10 when performing.    OBJECTIVE IMPAIRMENTS: Abnormal gait, decreased activity tolerance, decreased mobility, decreased ROM, decreased strength, hypomobility, postural dysfunction, and pain.   ACTIVITY LIMITATIONS: lifting, bending, stairs, transfers, and  locomotion level  PARTICIPATION LIMITATIONS: community activity and yard work  PERSONAL FACTORS: 1 comorbidity: Hypertension are also affecting patient's functional outcome.   REHAB POTENTIAL: Good  CLINICAL DECISION MAKING: Stable/uncomplicated  EVALUATION COMPLEXITY: Low   GOALS: Goals reviewed with patient? Yes  LONG TERM GOALS: Target date: 12/05/23  Patient will be independent with her HEP. Baseline:  Goal status: IN PROGRESS  2.  Patient will be able to safely ambulate for at least 80 feet without an assistive device for improved household mobility. Baseline:  Goal status: MET  3.  Patient will be able to navigate at least 4 steps with a reciprocal pattern for improved household mobility. Baseline:  Goal status: MET  4.  Patient will be able to transfer from sitting to standing without external support for improved independence. Baseline: Patient required upper extremity support for sit to stand transfers Goal status: MET  5.  Patient will improve her ODI score to 3/50 or less for improved perceived function with her daily activities. Baseline:7/3:3/50 Goal status: MET  PLAN:  PT FREQUENCY: 2x/week  PT DURATION: 4 weeks  PLANNED INTERVENTIONS: 97164- PT Re-evaluation, 97750- Physical Performance Testing, 97110-Therapeutic exercises, 97530- Therapeutic activity, 97112- Neuromuscular re-education, 97535- Self Care, [id number]-  Manual therapy, [phone]- Gait training, [phone]- Electrical stimulation (unattended), Patient/Family education, Balance training, Stair training, Joint mobilization, Spinal mobilization, Cryotherapy, and Moist heat.  PLAN FOR NEXT SESSION: Nustep, lumbar and lower extremity strengthening, gait training, and balance interventions   Czar Ysaguirre,CHRIS, PTA 11/27/2023, 10:14 AM

## 2023-11-29 ENCOUNTER — Ambulatory Visit

## 2023-11-29 ENCOUNTER — Other Ambulatory Visit: Payer: Self-pay | Admitting: Family

## 2023-11-29 DIAGNOSIS — M5459 Other low back pain: Secondary | ICD-10-CM | POA: Diagnosis not present

## 2023-11-29 DIAGNOSIS — R293 Abnormal posture: Secondary | ICD-10-CM | POA: Diagnosis not present

## 2023-11-29 DIAGNOSIS — I10 Essential (primary) hypertension: Secondary | ICD-10-CM

## 2023-11-29 DIAGNOSIS — M6281 Muscle weakness (generalized): Secondary | ICD-10-CM | POA: Diagnosis not present

## 2023-11-29 NOTE — Therapy (Signed)
 OUTPATIENT PHYSICAL THERAPY THORACOLUMBAR TREATMENT   Patient Name: Veronica Gomez MRN: [id number] DOB:1955-06-21, 68 y.o., female Today's Date: 11/29/2023  END OF SESSION:  PT End of Session - 11/29/23 0845     Visit Number 8    Number of Visits 8    Date for PT Re-Evaluation 01/11/24    Authorization - Number of Visits 12    PT Start Time 0846    PT Stop Time 0933    PT Time Calculation (min) 47 min            Past Medical History:  Diagnosis Date   CAD (coronary artery disease)    Cardiomyopathy (HCC)    GERD (gastroesophageal reflux disease)    Hyperlipidemia    Hypertension    Ischemia    Left bundle branch block (LBBB)    Past Surgical History:  Procedure Laterality Date   BACK SURGERY  07/25/2023   CERVICAL POLYPECTOMY  05/15/2008   FINGER SURGERY  05/15/2006   SHOULDER SURGERY Right 05/15/2012   TUBAL LIGATION  05/15/1986   Patient Active Problem List   Diagnosis Date Noted   Chronic bilateral low back pain without sciatica 02/03/2022   Combined forms of age-related cataract of left eye 10/15/2018   Regular astigmatism, left eye 10/15/2018   Chronic heart disease 10/08/2018   Gastroesophageal reflux disease 07/25/2017   Allergic rhinitis due to allergen 08/25/2016   Overweight (BMI 25.0-29.9) 09/14/2014   HTN (hypertension) 11/20/2013   Dyslipidemia 11/20/2013   Cardiomyopathy, ischemic 11/20/2013   LBBB (left bundle branch block) 11/20/2013    PCP: Lavell Bari LABOR, FNP  REFERRING PROVIDER: Moffo, Katelyn Rose, PA-C   REFERRING DIAG: S/P T10-Pelvis-DOS 07/25/23-Back Pain   Rationale for Evaluation and Treatment: Rehabilitation  THERAPY DIAG:  Other low back pain  Abnormal posture  Muscle weakness (generalized)  ONSET DATE: 07/25/23  SUBJECTIVE:                                                                                                                                                                                            SUBJECTIVE STATEMENT: Pt denies any pain today.    PERTINENT HISTORY:  Hypertension  PAIN:  Are you having pain? No  PRECAUTIONS: Back; gradually progress to lifting 40 pounds  RED FLAGS: None   WEIGHT BEARING RESTRICTIONS: No  FALLS:  Has patient fallen in last 6 months? No  LIVING ENVIRONMENT: Lives with: lives with their spouse Lives in: House/apartment Stairs: Yes: External: 6 steps; can reach both; step to pattern Has following equipment at home: Quad cane small base  OCCUPATION: retired  PLOF: Independent  PATIENT GOALS:  be able to go up down steps easier, improved ease with transfers, be able to travel, and be able to pick up things from the floor   NEXT MD VISIT: 01/18/24  OBJECTIVE:  Note: Objective measures were completed at Evaluation unless otherwise noted.  PATIENT SURVEYS:  Modified Oswestry:  MODIFIED OSWESTRY DISABILITY SCALE  Date: 11/07/23 Score  Pain intensity 0 = I can tolerate the pain I have without having to use pain medication.  2. Personal care (washing, dressing, etc.) 0 =  I can take care of myself normally without causing increased pain.  3. Lifting 1 = I can lift heavy weights, but it causes increased pain.  4. Walking 0 = Pain does not prevent me from walking any distance  5. Sitting 1 =  I can only sit in my favorite chair as long as I like.  6. Standing 1 =  I can stand as long as I want but, it increases my pain.  7. Sleeping 0 = Pain does not prevent me from sleeping well.  8. Social Life 0 = My social life is normal and does not increase my pain.  9. Traveling 3 = My pain restricts my travel over 1 hour  10. Employment/ Homemaking 2 = I can perform most of my homemaking/job duties, but pain prevents me from performing more physically stressful activities (eg, lifting, vacuuming).  Total 8/50   Interpretation of scores: Score Category Description  0-20% Minimal Disability The patient can cope with most living activities. Usually  no treatment is indicated apart from advice on lifting, sitting and exercise  21-40% Moderate Disability The patient experiences more pain and difficulty with sitting, lifting and standing. Travel and social life are more difficult and they may be disabled from work. Personal care, sexual activity and sleeping are not grossly affected, and the patient can usually be managed by conservative means  41-60% Severe Disability Pain remains the main problem in this group, but activities of daily living are affected. These patients require a detailed investigation  61-80% Crippled Back pain impinges on all aspects of the patient's life. Positive intervention is required  81-100% Bed-bound  These patients are either bed-bound or exaggerating their symptoms  Bluford FORBES Zoe DELENA Karon DELENA, et al. Surgery versus conservative management of stable thoracolumbar fracture: the PRESTO feasibility RCT. [place] ([place]): [employer]; 2021 Nov. [facility] Technology Assessment, No. 25.62.) Appendix 3, Oswestry Disability Index category descriptors. Available from: [employer]cz  Minimally Clinically Important Difference (MCID) = 12.8%  COGNITION: Overall cognitive status: Within functional limits for tasks assessed     SENSATION: Patient reports no numbness or tingling  POSTURE: decreased lumbar lordosis  PALPATION: No tenderness to palpation reported  LUMBAR ROM:   AROM eval  Flexion 12  Extension 14  Right lateral flexion 75% limited  Left lateral flexion 75% limited  Right rotation 25% limited  Left rotation 50% limited   (Blank rows = not tested)  LOWER EXTREMITY ROM: WFL for activities assessed  LOWER EXTREMITY MMT:    MMT Right eval Left eval  Hip flexion 3+/5; pressure  3+/5; pressure   Hip extension    Hip abduction    Hip adduction    Hip internal rotation    Hip external rotation    Knee flexion 5/5 5/5  Knee extension 4+/5 4+/5  Ankle  dorsiflexion 4/5 4/5  Ankle plantarflexion    Ankle inversion    Ankle eversion     (Blank rows = not tested)  LUMBAR SPECIAL TESTS:  Not tested due to surgical condition  GAIT: Assistive device utilized: Quad cane small base Level of assistance: Modified independence Comments: decreased stride length   TREATMENT DATE:                                                                                                                                11/27/23    EXERCISE LOG     LB fusion 07-25-23  Exercise Repetitions and Resistance Comments  Nustep     TM 1.8 MPH   x 15 mins   XTS blue Rows 4 x 10 each with AB bracing   XTS  blue EXT 2x10 with AB bracing   SLR w/ hip ABD     Glute sets     Forward Step Ups  BLE supported on red ball  Seated clams     Rocker board     LAQs 2# x 25 reps bil   Seated Marches 2# x 25 reps bil   Seated Ham Curls Green x 25 reps bil   Marching on BOSU 3 mins Without UE supprt  Side stepping on foam  4 mins Without UE support  Tandem Gait on foam 4 mins    Blank cell = exercise not performed today                                     11/07/23 EXERCISE LOG  Exercise Repetitions and Resistance Comments  Nustep  L3 x 15 minutes                    Blank cell = exercise not performed today    PATIENT EDUCATION:  Education details: heat vs ice, HEP, and healing Person educated: Patient Education method: Explanation Education comprehension: verbalized understanding  HOME EXERCISE PROGRAM:   ASSESSMENT:  CLINICAL IMPRESSION: Pt arrives for today's treatment session denying any pain.  Pt able to progress to 1.8 mph on the treadmill today without issue.  Pt able to tolerate increased time with all balance exercises today.  Pt introduced to marching on BOSU ball (ball up) with pt requiring BUE support for safety.  Pt also able to tolerate increased reps with previously performed seated exercises today without complaint.  Pt denied any pain at  completion of today's treatment session.  OBJECTIVE IMPAIRMENTS: Abnormal gait, decreased activity tolerance, decreased mobility, decreased ROM, decreased strength, hypomobility, postural dysfunction, and pain.   ACTIVITY LIMITATIONS: lifting, bending, stairs, transfers, and locomotion level  PARTICIPATION LIMITATIONS: community activity and yard work  PERSONAL FACTORS: 1 comorbidity: Hypertension are also affecting patient's functional outcome.   REHAB POTENTIAL: Good  CLINICAL DECISION MAKING: Stable/uncomplicated  EVALUATION COMPLEXITY: Low   GOALS: Goals reviewed with patient? Yes  LONG TERM GOALS: Target date: 12/05/23  Patient will be independent with her HEP. Baseline:  Goal status: IN PROGRESS  2.  Patient will be able to safely ambulate for at least 80 feet without an assistive device for improved household mobility. Baseline:  Goal status: MET  3.  Patient will be able to navigate at least 4 steps with a reciprocal pattern for improved household mobility. Baseline:  Goal status: MET  4.  Patient will be able to transfer from sitting to standing without external support for improved independence. Baseline: Patient required upper extremity support for sit to stand transfers Goal status: MET  5.  Patient will improve her ODI score to 3/50 or less for improved perceived function with her daily activities. Baseline:7/3:3/50 Goal status: MET  PLAN:  PT FREQUENCY: 2x/week  PT DURATION: 4 weeks  PLANNED INTERVENTIONS: 97164- PT Re-evaluation, 97750- Physical Performance Testing, 97110-Therapeutic exercises, 97530- Therapeutic activity, 97112- Neuromuscular re-education, 97535- Self Care, 02859- Manual therapy, [phone]- Gait training, [phone]- Electrical stimulation (unattended), Patient/Family education, Balance training, Stair training, Joint mobilization, Spinal mobilization, Cryotherapy, and Moist heat.  PLAN FOR NEXT SESSION: Nustep, lumbar and lower extremity  strengthening, gait training, and balance interventions   Delon DELENA Gosling, PTA 11/29/2023, 9:42 AM

## 2023-12-04 ENCOUNTER — Ambulatory Visit: Admitting: *Deleted

## 2023-12-04 ENCOUNTER — Encounter: Payer: Self-pay | Admitting: Internal Medicine

## 2023-12-04 ENCOUNTER — Encounter: Payer: Self-pay | Admitting: *Deleted

## 2023-12-04 ENCOUNTER — Ambulatory Visit: Attending: Internal Medicine | Admitting: Internal Medicine

## 2023-12-04 VITALS — BP 132/84 | HR 69 | Ht 63.0 in | Wt 179.2 lb

## 2023-12-04 DIAGNOSIS — R293 Abnormal posture: Secondary | ICD-10-CM | POA: Diagnosis not present

## 2023-12-04 DIAGNOSIS — M6281 Muscle weakness (generalized): Secondary | ICD-10-CM | POA: Diagnosis not present

## 2023-12-04 DIAGNOSIS — Z79899 Other long term (current) drug therapy: Secondary | ICD-10-CM | POA: Diagnosis not present

## 2023-12-04 DIAGNOSIS — I5022 Chronic systolic (congestive) heart failure: Secondary | ICD-10-CM | POA: Diagnosis not present

## 2023-12-04 DIAGNOSIS — I255 Ischemic cardiomyopathy: Secondary | ICD-10-CM | POA: Diagnosis not present

## 2023-12-04 DIAGNOSIS — I447 Left bundle-branch block, unspecified: Secondary | ICD-10-CM

## 2023-12-04 DIAGNOSIS — M5459 Other low back pain: Secondary | ICD-10-CM | POA: Diagnosis not present

## 2023-12-04 MED ORDER — SPIRONOLACTONE 25 MG PO TABS: 25.0000 mg | ORAL_TABLET | Freq: Every day | ORAL | 3 refills | Status: DC

## 2023-12-04 NOTE — Progress Notes (Signed)
 OFFICE NOTE  Chief Complaint:  No complaints  Primary Care Physician: Lavell Bari LABOR, FNP  HPI:  Arturo Freundlich is a pleasant 68 year old female who moved to [place]  in January of this year. Her past echo history is significant for hypertension, dyslipidemia and a former smoker, but quit over 25 years ago. 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 In 2009 she was noted to have an interventricular conduction delay, and was referred for stress test which was negative for ischemia. Records indicate 2013 she was having some chest discomfort and had a new, clear left bundle branch block. She underwent a nuclear stress test shows a fixed inferoapical defect. EF was 56%. An echocardiogram performed around the same time showed an EF of 40% with apical inferior wall hypokinesis. This was determined to be scar, no further workup was recommended as her symptoms were not thought to be due to ischemia. No ischemia was noted on her stress test. She is on appropriate medical therapy including aspirin, statin, ACE inhibitor, beta blocker and diuretic. She is quite active around her house and into your and denies any chest pain or worsening shortness of breath. Her weight has been stable. Her last office visit with her cardiologist was in December 2014 no changes to her medical regimen were recommended at that time. She did have a recent lipid profile which showed total cholesterol 162 and LDL of 99.  I saw Camerin back in the office today. She has no new complaints. We recently did lipid testing which shows her LDL cholesterol to be 99. That is still higher than her goal LDL of less than 70. We talked about possibly adjusting her Lipitor up however she reported she had side effects with that in the past.  Mrs. Deblasi returns today for follow-up. Overall she is feeling well. She denies any worsening shortness of breath or chest pain. Blood pressure has been very well  controlled. Recently she had discussed medicine changes which were planning based on her cholesterol not being quite at goal. She was concerned about myalgias on higher dose statin and therefore I recommended adding Avandia. Alternatively her primary care provider suggested she could take a double dose of Lipitor every other day. She's been doing that for at least several months and is due for recheck of her cholesterol. She currently denies any myalgias or myopathy  I had the pleasure seeing Ms. Fogelman back today in the office. Slightly earlier than our normal follow-up appointment. She's changed insurance companies and is interested in starting some more exercise. She denies any chest pain but does get short of breath with activity. She was due for repeat echocardiogram which I like to get to see if she's had any change in her LV function. She is currently on aspirin, Lipitor, carvedilol  and lisinopril .  08/25/2016  Mrs. Foell returns today for follow-up. Overall she is doing well. She denies any worsening shortness of breath or chest pain. She has NYHA class I symptoms. Based on this she does not qualify for Entresto . She is currently on carvedilol  and lisinopril  although but has been having recent cough. I think this is allergies but her PCP did write to switch her from lisinopril  over to losartan . She reports some rhinitis symptoms as well as itchy eyes and no significant pollen burden now. I suggested she may benefit from using a nasal steroid and saline nasal spray. She's currently using cetirizine and diphenhydramine. She should avoid  any decongestants such as pseudoephedrine.  12/01/2019  Mrs. Debose is seen today in follow-up.  I last saw her via virtual visit during the pandemic about a year ago.  Since then she has done well.  She denies any worsening shortness of breath or chest pain.  She reports her weight has been up or down but is a few pounds lower than it was before.  She is overdue for a  repeat lipid profile.  I previously increased her atorvastatin  up to 40 mg to try to reach a target LDL less than 70.  Also her last echocardiogram was in 2017 again showing an ischemic cardiomyopathy with EF of 40%.  She continues to have NYHA class I symptoms.  01/28/2021  Mrs. Wauneka returns today for follow-up.  LVEF has remained low at 40 to 45% with some regional wall motion abnormalities however compared to to a prior study in 2017 is stable.  She is on historic heart failure medications but is not on any of the newer guideline directed medical therapies.  We discussed the possibility of switching her ARB to Entresto  today and the fact that it might be helpful with improving her LV function although I do believe it is possibly related to bundle branch block.  11/07/2021  Mrs. Macrae is seen today in follow-up.  Overall she seems to be doing well.  She had seen Orren Fabry, PA-C in follow-up in January.  Her Entresto  has been further titrated.  Then she was seen by our pharmacist and started on Jardiance  as she was unable to tolerate Farxiga .  She seems to be doing well on the Jardiance .  Blood pressures at home have been solidly well controlled in the 120s over 80s.  She has no more than NYHA class I symptoms.  05/04/2022  Mrs. Leu is seen today in follow-up.  Overall she seems to be doing pretty well.  No chest pain or shortness of breath was noted.  She is on stable heart failure therapy.  She could not tolerate the SGLT2 inhibitors.  This was related to urinary tract infections.  She is on Entresto  and carvedilol .  Her LVEF is stayed fairly stable on echo at 43%.  She has NYHA class I symptoms.  EKG today shows sinus rhythm with a left bundle branch block.  09/13/2022  Marirose is seen today in follow-up.  She underwent a cardiac MRI which fortunately showed no prior scar.  EF has been consistently in the low 40s and was 43% on the MRI.  No evidence of gadolinium enhancement.  It was speculated  that there may be cardiomyopathy related to left bundle branch block.  I do think this is the case.  However she continues to endorse no more than NYHA class I symptoms.  Based on that she would not really be a candidate for CRT therapy and her EF is a little too high.  I also considered CCM therapy however this is really for class III or IV heart failure patients.  Blood pressure continues to be a little elevated.  She says it is never really below 120/80.  That may indicate there is room for titrating her Entresto .  03/19/2023  Lahela returns today for follow-up.  Overall she says she is doing fairly well.  Unfortunately she has developed a spinal column deformity which is caused her significant pain.  She was referred to rehabilitation however she may need upcoming surgery, specifically after she showed me her x-rays.  She denies any shortness  of breath or worsening chest pain.  She is on guideline directed medical therapy for heart failure including carvedilol , hydrochlorothiazide  and Entresto .  She had a significant yeast infection on Jardiance  and that is considered a contraindication.  She had lipid testing in August showing total cholesterol 134, HDL 42, triglycerides 70 and LDL 78.  Renal function is normal with creatinine 0.8.  12/04/2023  Mirah returns today for follow-up.  She had recent extensive spine surgery for collapsing vertebrae which she said ultimately went pretty well.  She still recovering from that but no cardiac issues.  Her cardiac MRI last year showed an LVEF of 43%.  There is significant ventricular dyssynchrony related to left bundle branch block but otherwise no clear cause of her heart failure such as infiltrative disease or scar.  She has not been able to tolerate Jardiance  due to infection.  Otherwise she is on guideline directed medical therapies.  We did discuss the possibility of adding Aldactone .  She denies any worsening shortness of breath or chest pain.  PMHx:   Past Medical History:  Diagnosis Date   CAD (coronary artery disease)    Cardiomyopathy (HCC)    GERD (gastroesophageal reflux disease)    Hyperlipidemia    Hypertension    Ischemia    Left bundle branch block (LBBB)     Past Surgical History:  Procedure Laterality Date   BACK SURGERY  07/25/2023   CERVICAL POLYPECTOMY  05/15/2008   FINGER SURGERY  05/15/2006   SHOULDER SURGERY Right 05/15/2012   TUBAL LIGATION  05/15/1986    FAMHx:  Family History  Problem Relation Age of Onset   Osteoporosis Mother    Asthma Mother    Hypertension Mother    Hypertension Father    Heart disease Father        MI    SOCHx:   reports that she quit smoking about 35 years ago. Her smoking use included cigarettes. She has never used smokeless tobacco. She reports current alcohol use. She reports that she does not use drugs.  ALLERGIES:  Allergies  Allergen Reactions   Farxiga  [Dapagliflozin ]     myalgias   Jardiance  [Empagliflozin ]     Yeast     ROS: Pertinent items noted in HPI and remainder of comprehensive ROS otherwise negative.  HOME MEDS: Current Outpatient Medications  Medication Sig Dispense Refill   Ascorbic Acid (VITAMIN C PO) Vitamin C Chewable 500 mg tablet   1 tablet every day by oral route.     ascorbic acid (VITAMIN C) 500 MG tablet Take 500 mg by mouth.     aspirin 81 MG tablet Take 81 mg by mouth daily.     atorvastatin  (LIPITOR) 40 MG tablet TAKE 1 TABLET BY MOUTH DAILY 100 tablet 1   baclofen  (LIORESAL ) 10 MG tablet Take 1 tablet (10 mg total) by mouth 3 (three) times daily. 270 tablet 0   Calcium  Carb-Cholecalciferol (OYSTER SHELL CALCIUM  250+D) 250-3.125 MG-MCG TABS Take 1 tablet by mouth.     carvedilol  (COREG ) 6.25 MG tablet TAKE 1 TABLET BY MOUTH TWICE  DAILY WITH A MEAL 200 tablet 1   Cholecalciferol (VITAMIN D3) 25 MCG (1000 UT) CAPS Take by mouth daily.     cyanocobalamin (VITAMIN B12) 1000 MCG tablet Take 3,000 mcg by mouth daily.     diclofenac   (VOLTAREN ) 75 MG EC tablet TAKE 1 TABLET BY MOUTH TWICE  DAILY 200 tablet 1   diclofenac  Sodium (VOLTAREN ) 1 % GEL Apply 2 g topically 4 (  four) times daily. 350 g 1   diphenhydrAMINE (BENADRYL) 25 mg capsule Take 25 mg by mouth.     hydrochlorothiazide  (HYDRODIURIL ) 25 MG tablet TAKE 1 TABLET (25 MG TOTAL) BY MOUTH DAILY. 90 tablet 1   HYDROcodone-acetaminophen (NORCO/VICODIN) 5-325 MG tablet Take 1 tablet by mouth every 4 (four) hours as needed for moderate pain (pain score 4-6).     melatonin 3 MG TABS tablet Take 3 mg by mouth.     Melatonin-Pyridoxine (MELATIN PO) Take by mouth.     omeprazole  (PRILOSEC) 20 MG capsule TAKE 1 CAPSULE BY MOUTH DAILY 100 capsule 1   sacubitril -valsartan  (ENTRESTO ) 97-103 MG TAKE 1 TABLET BY MOUTH TWICE A DAY 180 tablet 1   terbinafine  (LAMISIL ) 250 MG tablet Take 1 tablet (250 mg total) by mouth daily. 90 tablet 0   terbinafine  (LAMISIL ) 250 MG tablet Take 1 tablet (250 mg total) by mouth daily. 90 tablet 0   tiZANidine (ZANAFLEX) 4 MG tablet Take 2-4 mg by mouth every 8 (eight) hours as needed.     No current facility-administered medications for this visit.    LABS/IMAGING: No results found for this or any previous visit (from the past 48 hours). No results found.  VITALS: BP 132/84   Pulse 69   Ht 5' 3 (1.6 m)   Wt 179 lb 3.2 oz (81.3 kg)   LMP 09/13/2008 (Approximate)   SpO2 97%   BMI 31.74 kg/m   EXAM: General appearance: alert and no distress Neck: no carotid bruit and no JVD Lungs: clear to auscultation bilaterally Heart: regular rate and rhythm, S1, S2 normal, no murmur, click, rub or gallop Abdomen: soft, non-tender; bowel sounds normal; no masses,  no organomegaly Extremities: extremities normal, atraumatic, no cyanosis or edema Pulses: 2+ and symmetric Skin: Skin color, texture, turgor normal. No rashes or lesions Neurologic: Grossly normal Psych: Mood, affect normal  EKG: EKG Interpretation Date/Time:  Tuesday December 04 2023  16:23:41 EDT Ventricular Rate:  69 PR Interval:  196 QRS Duration:  158 QT Interval:  460 QTC Calculation: 492 R Axis:   -59  Text Interpretation: Normal sinus rhythm Left axis deviation Left bundle branch block When compared with ECG of 19-Mar-2023 08:52, No significant change was found Confirmed by Mona Kent [phone]) on 12/04/2023 4:36:31 PM    ASSESSMENT: Non-ischemic cardiomyopathy, LVEF 43% by cMRI - no scar or infiltrative process (07/2022) Fixed inferoapical defect suggestive of scar on prior nuclear stress testing Chronic LBBB Hypertension Dyslipidemia Former smoker  PLAN: 1.   Mrs. Province underwent successful back surgery and is recovering from that.  She does have what appears to be nonischemic cardiomyopathy, possibly related to left bundle branch block.  We discussed adding Aldactone  25 mg daily today.  I will stop her hydrochlorothiazide .  Will plan to repeat be met in about a week to make sure that her renal function and potassium are stable.  Otherwise we will continue these current therapies and plan to repeat echo in about 6 months with follow-up afterwards.  Kent KYM Mona, MD, [facility], FNLA, FACP  [place]  [facility]  Medical Director of the [facility]  [facility] Diplomate of the American Board of Clinical Lipidology Attending Cardiologist  Direct Dial: [phone]  Fax: [phone]  Website:  [email or web address]    Kent JAYSON Mona 12/04/2023, 4:36 PM

## 2023-12-04 NOTE — Patient Instructions (Signed)
 Medication Instructions:  STOP Hydrochlorothiazide   START Spironolactone  25 mg daily *If you need a refill on your cardiac medications before your next appointment, please call your pharmacy*  Lab Work: BMET in one week If you have labs (blood work) drawn today and your tests are completely normal, you will receive your results only by: MyChart Message (if you have MyChart) OR A paper copy in the mail If you have any lab test that is abnormal or we need to change your treatment, we will call you to review the results.  Testing/Procedures: In 6 months (prior to follow up appointment) Your physician has requested that you have an echocardiogram. Echocardiography is a painless test that uses sound waves to create images of your heart. It provides your doctor with information about the size and shape of your heart and how well your heart's chambers and valves are working. This procedure takes approximately one hour. There are no restrictions for this procedure. Please do NOT wear cologne, perfume, aftershave, or lotions (deodorant is allowed). Please arrive 15 minutes prior to your appointment time.   Follow-Up: At [facility], you and your health needs are our priority.  As part of our continuing mission to provide you with exceptional heart care, our providers are all part of one team.  This team includes your primary Cardiologist (physician) and Advanced Practice Providers or APPs (Physician Assistants and Nurse Practitioners) who all work together to provide you with the care you need, when you need it.  Your next appointment:   6 months with Dr. Mona

## 2023-12-04 NOTE — Therapy (Addendum)
 OUTPATIENT PHYSICAL THERAPY THORACOLUMBAR TREATMENT   Patient Name: Veronica Gomez MRN: [id number] DOB:10/05/1955, 68 y.o., female Today's Date: 12/04/2023  END OF SESSION:  PT End of Session - 12/04/23 1351     Visit Number 9    Number of Visits 12    Date for PT Re-Evaluation 01/11/24    PT Start Time 1345    PT Stop Time 1431    PT Time Calculation (min) 46 min            Past Medical History:  Diagnosis Date   CAD (coronary artery disease)    Cardiomyopathy (HCC)    GERD (gastroesophageal reflux disease)    Hyperlipidemia    Hypertension    Ischemia    Left bundle branch block (LBBB)    Past Surgical History:  Procedure Laterality Date   BACK SURGERY  07/25/2023   CERVICAL POLYPECTOMY  05/15/2008   FINGER SURGERY  05/15/2006   SHOULDER SURGERY Right 05/15/2012   TUBAL LIGATION  05/15/1986   Patient Active Problem List   Diagnosis Date Noted   Chronic bilateral low back pain without sciatica 02/03/2022   Combined forms of age-related cataract of left eye 10/15/2018   Regular astigmatism, left eye 10/15/2018   Chronic heart disease 10/08/2018   Gastroesophageal reflux disease 07/25/2017   Allergic rhinitis due to allergen 08/25/2016   Overweight (BMI 25.0-29.9) 09/14/2014   HTN (hypertension) 11/20/2013   Dyslipidemia 11/20/2013   Cardiomyopathy, ischemic 11/20/2013   LBBB (left bundle branch block) 11/20/2013    PCP: Lavell Bari LABOR, FNP  REFERRING PROVIDER: Moffo, Katelyn Rose, PA-C   REFERRING DIAG: S/P T10-Pelvis-DOS 07/25/23-Back Pain   Rationale for Evaluation and Treatment: Rehabilitation  THERAPY DIAG:  Other low back pain  Abnormal posture  Muscle weakness (generalized)  ONSET DATE: 07/25/23  SUBJECTIVE:                                                                                                                                                                                           SUBJECTIVE STATEMENT: Pt reports having  increased pain after last Rx, but better today. Pt reports that Vacuuming still aggravates her back 4-5/10    PERTINENT HISTORY:  Hypertension  PAIN:  Are you having pain? No  PRECAUTIONS: Back; gradually progress to lifting 40 pounds  RED FLAGS: None   WEIGHT BEARING RESTRICTIONS: No  FALLS:  Has patient fallen in last 6 months? No  LIVING ENVIRONMENT: Lives with: lives with their spouse Lives in: House/apartment Stairs: Yes: External: 6 steps; can reach both; step to pattern Has following equipment at home: Quad cane small base  OCCUPATION: retired  PLOF: Independent  PATIENT GOALS: be able to go up down steps easier, improved ease with transfers, be able to travel, and be able to pick up things from the floor   NEXT MD VISIT: 01/18/24  OBJECTIVE:  Note: Objective measures were completed at Evaluation unless otherwise noted.  PATIENT SURVEYS:  Modified Oswestry:  MODIFIED OSWESTRY DISABILITY SCALE  Date: 11/07/23 Score  Pain intensity 0 = I can tolerate the pain I have without having to use pain medication.  2. Personal care (washing, dressing, etc.) 0 =  I can take care of myself normally without causing increased pain.  3. Lifting 1 = I can lift heavy weights, but it causes increased pain.  4. Walking 0 = Pain does not prevent me from walking any distance  5. Sitting 1 =  I can only sit in my favorite chair as long as I like.  6. Standing 1 =  I can stand as long as I want but, it increases my pain.  7. Sleeping 0 = Pain does not prevent me from sleeping well.  8. Social Life 0 = My social life is normal and does not increase my pain.  9. Traveling 3 = My pain restricts my travel over 1 hour  10. Employment/ Homemaking 2 = I can perform most of my homemaking/job duties, but pain prevents me from performing more physically stressful activities (eg, lifting, vacuuming).  Total 8/50   Interpretation of scores: Score Category Description  0-20% Minimal Disability The  patient can cope with most living activities. Usually no treatment is indicated apart from advice on lifting, sitting and exercise  21-40% Moderate Disability The patient experiences more pain and difficulty with sitting, lifting and standing. Travel and social life are more difficult and they may be disabled from work. Personal care, sexual activity and sleeping are not grossly affected, and the patient can usually be managed by conservative means  41-60% Severe Disability Pain remains the main problem in this group, but activities of daily living are affected. These patients require a detailed investigation  61-80% Crippled Back pain impinges on all aspects of the patient's life. Positive intervention is required  81-100% Bed-bound  These patients are either bed-bound or exaggerating their symptoms  Bluford FORBES Zoe DELENA Karon DELENA, et al. Surgery versus conservative management of stable thoracolumbar fracture: the PRESTO feasibility RCT. [place]): [employer]; 2021 Nov. [facility] Technology Assessment, No. 25.62.) Appendix 3, Oswestry Disability Index category descriptors. Available from: [employer]  Minimally Clinically Important Difference (MCID) = 12.8%  COGNITION: Overall cognitive status: Within functional limits for tasks assessed     SENSATION: Patient reports no numbness or tingling  POSTURE: decreased lumbar lordosis  PALPATION: No tenderness to palpation reported  LUMBAR ROM:   AROM eval  Flexion 12  Extension 14  Right lateral flexion 75% limited  Left lateral flexion 75% limited  Right rotation 25% limited  Left rotation 50% limited   (Blank rows = not tested)  LOWER EXTREMITY ROM: WFL for activities assessed  LOWER EXTREMITY MMT:    MMT Right eval Left eval  Hip flexion 3+/5; pressure  3+/5; pressure   Hip extension    Hip abduction    Hip adduction    Hip internal rotation    Hip external rotation     Knee flexion 5/5 5/5  Knee extension 4+/5 4+/5  Ankle dorsiflexion 4/5 4/5  Ankle plantarflexion    Ankle inversion    Ankle eversion     (Blank  rows = not tested)  LUMBAR SPECIAL TESTS:  Not tested due to surgical condition  GAIT: Assistive device utilized: Quad cane small base Level of assistance: Modified independence Comments: decreased stride length   TREATMENT DATE:                                                                                                                                12/04/23    EXERCISE LOG     LB fusion 07-25-23  Exercise Repetitions and Resistance Comments  Nustep  L5 x 10 mins   TM 1.8 MPH   x 10 mins   XTS blue Rows 4 x 10 each with AB bracing   XTS  blue EXT 2x10 with AB bracing   SLR w/ hip ABD     Glute sets     Forward Step Ups  BLE supported on red ball  Seated clams     Rocker board     LAQs 2# 2 x 20 reps bil   Seated Marches 2#  2 x  20 reps bil   Seated Ham Curls Green x 25 reps bil     Without UE supprt  Side stepping on foam  4 mins Without UE support  Tandem Gait on foam 4 mins    Blank cell = exercise not performed today                                     11/07/23 EXERCISE LOG  Exercise Repetitions and Resistance Comments  Nustep  L3 x 15 minutes                    Blank cell = exercise not performed today    PATIENT EDUCATION:  Education details: heat vs ice, HEP, and healing Person educated: Patient Education method: Explanation Education comprehension: verbalized understanding  HOME EXERCISE PROGRAM:   ASSESSMENT:  CLINICAL IMPRESSION: Pt arrives for today's session and reports being sore after last visit, but was fine the next day. She reports doing better with ADL's but still has increased LBP 4-5/10 with vacuuming.  Rx focused on core activation and strengthening as well as LE strengthening. Pt reports no increased pain end of session.  12/04/23 PROGRESS REPORT:  Patient is making excellent progress  with skilled physical therapy as evidenced by her subjective reports, ODI score, functional mobility, and progress toward her goals. She was able to meet most of her initial long term goals. However, she continues to experience increased low back with vacuuming. Her goals were updated to reflect these remaining limitations. Recommend that she continue with skilled physical therapy for 4 additional visits at twice per week to address her remaining impairments to return to her prior level of function.   Lacinda Fass, PT, DPT   OBJECTIVE IMPAIRMENTS: Abnormal gait, decreased activity tolerance, decreased mobility, decreased ROM, decreased strength,  hypomobility, postural dysfunction, and pain.   ACTIVITY LIMITATIONS: lifting, bending, stairs, transfers, and locomotion level  PARTICIPATION LIMITATIONS: community activity and yard work  PERSONAL FACTORS: 1 comorbidity: Hypertension are also affecting patient's functional outcome.   REHAB POTENTIAL: Good  CLINICAL DECISION MAKING: Stable/uncomplicated  EVALUATION COMPLEXITY: Low   GOALS: Goals reviewed with patient? Yes  LONG TERM GOALS: Target date: 12/05/23  Patient will be independent with her HEP. Baseline:  Goal status: On going  2.  Patient will be able to safely ambulate for at least 80 feet without an assistive device for improved household mobility. Baseline:  Goal status: MET  3.  Patient will be able to navigate at least 4 steps with a reciprocal pattern for improved household mobility. Baseline:  Goal status: MET  4.  Patient will be able to transfer from sitting to standing without external support for improved independence. Baseline: Patient required upper extremity support for sit to stand transfers Goal status: MET  5.  Patient will improve her ODI score to 3/50 or less for improved perceived function with her daily activities. Baseline:7/3:3/50 Goal status: MET  6.  Patient will be able to vacuum without her  familiar symptoms exceeding 3/10. Baseline:  Goal status: INITIAL   PLAN:  PT FREQUENCY: 2x/week  PT DURATION: 4 weeks  PLANNED INTERVENTIONS: 97164- PT Re-evaluation, 97750- Physical Performance Testing, 97110-Therapeutic exercises, 97530- Therapeutic activity, [id number]- Neuromuscular re-education, 97535- Self Care, 02859- Manual therapy, [phone]- Gait training, [phone]- Electrical stimulation (unattended), Patient/Family education, Balance training, Stair training, Joint mobilization, Spinal mobilization, Cryotherapy, and Moist heat.  PLAN FOR NEXT SESSION: Nustep, lumbar and lower extremity strengthening, gait training, and balance interventions   Jacqeline Broers,CHRIS, PTA 12/04/2023, 2:44 PM

## 2023-12-04 NOTE — Addendum Note (Signed)
 Addended by: ELSPETH LACINDA BROCKS on: 12/04/2023 03:01 PM   Modules accepted: Orders

## 2023-12-06 ENCOUNTER — Ambulatory Visit

## 2023-12-06 DIAGNOSIS — M6281 Muscle weakness (generalized): Secondary | ICD-10-CM | POA: Diagnosis not present

## 2023-12-06 DIAGNOSIS — R293 Abnormal posture: Secondary | ICD-10-CM

## 2023-12-06 DIAGNOSIS — M5459 Other low back pain: Secondary | ICD-10-CM

## 2023-12-06 NOTE — Therapy (Signed)
 OUTPATIENT PHYSICAL THERAPY THORACOLUMBAR TREATMENT   Patient Name: Veronica Gomez MRN: [id number] DOB:November 03, 1955, 68 y.o., female Today's Date: 12/06/2023  END OF SESSION:  PT End of Session - 12/06/23 0854     Visit Number 10    Number of Visits 12    Date for PT Re-Evaluation 01/11/24    PT Start Time 0850    PT Stop Time 0929    PT Time Calculation (min) 39 min            Past Medical History:  Diagnosis Date   CAD (coronary artery disease)    Cardiomyopathy (HCC)    GERD (gastroesophageal reflux disease)    Hyperlipidemia    Hypertension    Ischemia    Left bundle branch block (LBBB)    Past Surgical History:  Procedure Laterality Date   BACK SURGERY  07/25/2023   CERVICAL POLYPECTOMY  05/15/2008   FINGER SURGERY  05/15/2006   SHOULDER SURGERY Right 05/15/2012   TUBAL LIGATION  05/15/1986   Patient Active Problem List   Diagnosis Date Noted   Chronic bilateral low back pain without sciatica 02/03/2022   Combined forms of age-related cataract of left eye 10/15/2018   Regular astigmatism, left eye 10/15/2018   Chronic heart disease 10/08/2018   Gastroesophageal reflux disease 07/25/2017   Allergic rhinitis due to allergen 08/25/2016   Overweight (BMI 25.0-29.9) 09/14/2014   HTN (hypertension) 11/20/2013   Dyslipidemia 11/20/2013   Cardiomyopathy, ischemic 11/20/2013   LBBB (left bundle branch block) 11/20/2013    PCP: Lavell Bari LABOR, FNP  REFERRING PROVIDER: Moffo, Katelyn Rose, PA-C   REFERRING DIAG: S/P T10-Pelvis-DOS 07/25/23-Back Pain   Rationale for Evaluation and Treatment: Rehabilitation  THERAPY DIAG:  Other low back pain  Abnormal posture  Muscle weakness (generalized)  ONSET DATE: 07/25/23  SUBJECTIVE:                                                                                                                                                                                           SUBJECTIVE STATEMENT: Pt denies any pain  today.  Was able to get in her pool yesterday.    PERTINENT HISTORY:  Hypertension  PAIN:  Are you having pain? No  PRECAUTIONS: Back; gradually progress to lifting 40 pounds  RED FLAGS: None   WEIGHT BEARING RESTRICTIONS: No  FALLS:  Has patient fallen in last 6 months? No  LIVING ENVIRONMENT: Lives with: lives with their spouse Lives in: House/apartment Stairs: Yes: External: 6 steps; can reach both; step to pattern Has following equipment at home: Quad cane small base  OCCUPATION: retired  PLOF: Independent  PATIENT GOALS:  be able to go up down steps easier, improved ease with transfers, be able to travel, and be able to pick up things from the floor   NEXT MD VISIT: 01/18/24  OBJECTIVE:  Note: Objective measures were completed at Evaluation unless otherwise noted.  PATIENT SURVEYS:  Modified Oswestry:  MODIFIED OSWESTRY DISABILITY SCALE  Date: 11/07/23 Score  Pain intensity 0 = I can tolerate the pain I have without having to use pain medication.  2. Personal care (washing, dressing, etc.) 0 =  I can take care of myself normally without causing increased pain.  3. Lifting 1 = I can lift heavy weights, but it causes increased pain.  4. Walking 0 = Pain does not prevent me from walking any distance  5. Sitting 1 =  I can only sit in my favorite chair as long as I like.  6. Standing 1 =  I can stand as long as I want but, it increases my pain.  7. Sleeping 0 = Pain does not prevent me from sleeping well.  8. Social Life 0 = My social life is normal and does not increase my pain.  9. Traveling 3 = My pain restricts my travel over 1 hour  10. Employment/ Homemaking 2 = I can perform most of my homemaking/job duties, but pain prevents me from performing more physically stressful activities (eg, lifting, vacuuming).  Total 8/50   Interpretation of scores: Score Category Description  0-20% Minimal Disability The patient can cope with most living activities. Usually no  treatment is indicated apart from advice on lifting, sitting and exercise  21-40% Moderate Disability The patient experiences more pain and difficulty with sitting, lifting and standing. Travel and social life are more difficult and they may be disabled from work. Personal care, sexual activity and sleeping are not grossly affected, and the patient can usually be managed by conservative means  41-60% Severe Disability Pain remains the main problem in this group, but activities of daily living are affected. These patients require a detailed investigation  61-80% Crippled Back pain impinges on all aspects of the patient's life. Positive intervention is required  81-100% Bed-bound  These patients are either bed-bound or exaggerating their symptoms  Bluford FORBES Zoe DELENA Karon DELENA, et al. Surgery versus conservative management of stable thoracolumbar fracture: the PRESTO feasibility RCT. [place] ([place]): [employer]; 2021 Nov. [facility] Technology Assessment, No. 25.62.) Appendix 3, Oswestry Disability Index category descriptors. Available from: [employer]cz  Minimally Clinically Important Difference (MCID) = 12.8%  COGNITION: Overall cognitive status: Within functional limits for tasks assessed     SENSATION: Patient reports no numbness or tingling  POSTURE: decreased lumbar lordosis  PALPATION: No tenderness to palpation reported  LUMBAR ROM:   AROM eval  Flexion 12  Extension 14  Right lateral flexion 75% limited  Left lateral flexion 75% limited  Right rotation 25% limited  Left rotation 50% limited   (Blank rows = not tested)  LOWER EXTREMITY ROM: WFL for activities assessed  LOWER EXTREMITY MMT:    MMT Right eval Left eval  Hip flexion 3+/5; pressure  3+/5; pressure   Hip extension    Hip abduction    Hip adduction    Hip internal rotation    Hip external rotation    Knee flexion 5/5 5/5  Knee extension 4+/5 4+/5  Ankle  dorsiflexion 4/5 4/5  Ankle plantarflexion    Ankle inversion    Ankle eversion     (Blank rows = not tested)  LUMBAR SPECIAL TESTS:  Not tested due to surgical condition  GAIT: Assistive device utilized: Quad cane small base Level of assistance: Modified independence Comments: decreased stride length   TREATMENT DATE:                                                                                                                                12/04/23    EXERCISE LOG     LB fusion 07-25-23  Exercise Repetitions and Resistance Comments  Nustep     TM 1.8 MPH   x 10 mins   XTS blue Rows 4 x 10 each with AB bracing   XTS  blue EXT 3 x 10 with AB bracing   SLR w/ hip ABD     Glute sets     Forward Step Ups  BLE supported on red ball  Seated clams     Rocker board     LAQs 2# 2 x 20 reps bil   Seated Marches 2#  2 x  20 reps bil   Seated Ham Curls Green x 25 reps bil     Without UE supprt  Side stepping on foam  4.5 mins Without UE support  Tandem Gait on foam 4.5 mins    Blank cell = exercise not performed today                                     11/07/23 EXERCISE LOG  Exercise Repetitions and Resistance Comments  Nustep  L3 x 15 minutes                    Blank cell = exercise not performed today    PATIENT EDUCATION:  Education details: heat vs ice, HEP, and healing Person educated: Patient Education method: Explanation Education comprehension: verbalized understanding  HOME EXERCISE PROGRAM:   ASSESSMENT:  CLINICAL IMPRESSION: Pt arrives for today's treatment session denying any pain.  Pt states that she was able to get into her pool yesterday and it felt good.  Pt able to tolerate increased time and reps with all previously performed exercises.  Pt requiring minimal cues for proper technique and posture.  Pt denied any pain at completion of today's treatment session.  OBJECTIVE IMPAIRMENTS: Abnormal gait, decreased activity tolerance, decreased mobility,  decreased ROM, decreased strength, hypomobility, postural dysfunction, and pain.   ACTIVITY LIMITATIONS: lifting, bending, stairs, transfers, and locomotion level  PARTICIPATION LIMITATIONS: community activity and yard work  PERSONAL FACTORS: 1 comorbidity: Hypertension are also affecting patient's functional outcome.   REHAB POTENTIAL: Good  CLINICAL DECISION MAKING: Stable/uncomplicated  EVALUATION COMPLEXITY: Low   GOALS: Goals reviewed with patient? Yes  LONG TERM GOALS: Target date: 12/05/23  Patient will be independent with her HEP. Baseline:  Goal status: On going  2.  Patient will be able to safely ambulate for at least 80 feet  without an assistive device for improved household mobility. Baseline:  Goal status: MET  3.  Patient will be able to navigate at least 4 steps with a reciprocal pattern for improved household mobility. Baseline:  Goal status: MET  4.  Patient will be able to transfer from sitting to standing without external support for improved independence. Baseline: Patient required upper extremity support for sit to stand transfers Goal status: MET  5.  Patient will improve her ODI score to 3/50 or less for improved perceived function with her daily activities. Baseline:7/3:3/50 Goal status: MET  6.  Patient will be able to vacuum without her familiar symptoms exceeding 3/10. Baseline:  Goal status: INITIAL   PLAN:  PT FREQUENCY: 2x/week  PT DURATION: 4 weeks  PLANNED INTERVENTIONS: 97164- PT Re-evaluation, 97750- Physical Performance Testing, 97110-Therapeutic exercises, 97530- Therapeutic activity, [id number]- Neuromuscular re-education, 97535- Self Care, 02859- Manual therapy, [phone]- Gait training, [phone]- Electrical stimulation (unattended), Patient/Family education, Balance training, Stair training, Joint mobilization, Spinal mobilization, Cryotherapy, and Moist heat.  PLAN FOR NEXT SESSION: Nustep, lumbar and lower extremity strengthening, gait  training, and balance interventions   Delon DELENA Gosling, PTA 12/06/2023, 9:47 AM

## 2023-12-10 ENCOUNTER — Ambulatory Visit

## 2023-12-10 DIAGNOSIS — M6281 Muscle weakness (generalized): Secondary | ICD-10-CM

## 2023-12-10 DIAGNOSIS — M5459 Other low back pain: Secondary | ICD-10-CM

## 2023-12-10 DIAGNOSIS — R293 Abnormal posture: Secondary | ICD-10-CM | POA: Diagnosis not present

## 2023-12-10 NOTE — Therapy (Signed)
 OUTPATIENT PHYSICAL THERAPY THORACOLUMBAR TREATMENT   Patient Name: Veronica Gomez MRN: [id number] DOB:June 30, 1955, 68 y.o., female Today's Date: 12/10/2023  END OF SESSION:  PT End of Session - 12/10/23 0804     Visit Number 11    Number of Visits 12    Date for PT Re-Evaluation 01/11/24    PT Start Time 0800    PT Stop Time 0843    PT Time Calculation (min) 43 min            Past Medical History:  Diagnosis Date   CAD (coronary artery disease)    Cardiomyopathy (HCC)    GERD (gastroesophageal reflux disease)    Hyperlipidemia    Hypertension    Ischemia    Left bundle branch block (LBBB)    Past Surgical History:  Procedure Laterality Date   BACK SURGERY  07/25/2023   CERVICAL POLYPECTOMY  05/15/2008   FINGER SURGERY  05/15/2006   SHOULDER SURGERY Right 05/15/2012   TUBAL LIGATION  05/15/1986   Patient Active Problem List   Diagnosis Date Noted   Chronic bilateral low back pain without sciatica 02/03/2022   Combined forms of age-related cataract of left eye 10/15/2018   Regular astigmatism, left eye 10/15/2018   Chronic heart disease 10/08/2018   Gastroesophageal reflux disease 07/25/2017   Allergic rhinitis due to allergen 08/25/2016   Overweight (BMI 25.0-29.9) 09/14/2014   HTN (hypertension) 11/20/2013   Dyslipidemia 11/20/2013   Cardiomyopathy, ischemic 11/20/2013   LBBB (left bundle branch block) 11/20/2013    PCP: Lavell Bari LABOR, FNP  REFERRING PROVIDER: Moffo, Katelyn Rose, PA-C   REFERRING DIAG: S/P T10-Pelvis-DOS 07/25/23-Back Pain   Rationale for Evaluation and Treatment: Rehabilitation  THERAPY DIAG:  Other low back pain  Abnormal posture  Muscle weakness (generalized)  ONSET DATE: 07/25/23  SUBJECTIVE:                                                                                                                                                                                           SUBJECTIVE STATEMENT: Pt denies any pain  today.  Got in the pool over the weekend.   PERTINENT HISTORY:  Hypertension  PAIN:  Are you having pain? No  PRECAUTIONS: Back; gradually progress to lifting 40 pounds  RED FLAGS: None   WEIGHT BEARING RESTRICTIONS: No  FALLS:  Has patient fallen in last 6 months? No  LIVING ENVIRONMENT: Lives with: lives with their spouse Lives in: House/apartment Stairs: Yes: External: 6 steps; can reach both; step to pattern Has following equipment at home: Quad cane small base  OCCUPATION: retired  PLOF: Independent  PATIENT GOALS: be able  to go up down steps easier, improved ease with transfers, be able to travel, and be able to pick up things from the floor   NEXT MD VISIT: 01/18/24  OBJECTIVE:  Note: Objective measures were completed at Evaluation unless otherwise noted.  PATIENT SURVEYS:  Modified Oswestry:  MODIFIED OSWESTRY DISABILITY SCALE  Date: 11/07/23 Score  Pain intensity 0 = I can tolerate the pain I have without having to use pain medication.  2. Personal care (washing, dressing, etc.) 0 =  I can take care of myself normally without causing increased pain.  3. Lifting 1 = I can lift heavy weights, but it causes increased pain.  4. Walking 0 = Pain does not prevent me from walking any distance  5. Sitting 1 =  I can only sit in my favorite chair as long as I like.  6. Standing 1 =  I can stand as long as I want but, it increases my pain.  7. Sleeping 0 = Pain does not prevent me from sleeping well.  8. Social Life 0 = My social life is normal and does not increase my pain.  9. Traveling 3 = My pain restricts my travel over 1 hour  10. Employment/ Homemaking 2 = I can perform most of my homemaking/job duties, but pain prevents me from performing more physically stressful activities (eg, lifting, vacuuming).  Total 8/50   Interpretation of scores: Score Category Description  0-20% Minimal Disability The patient can cope with most living activities. Usually no  treatment is indicated apart from advice on lifting, sitting and exercise  21-40% Moderate Disability The patient experiences more pain and difficulty with sitting, lifting and standing. Travel and social life are more difficult and they may be disabled from work. Personal care, sexual activity and sleeping are not grossly affected, and the patient can usually be managed by conservative means  41-60% Severe Disability Pain remains the main problem in this group, but activities of daily living are affected. These patients require a detailed investigation  61-80% Crippled Back pain impinges on all aspects of the patient's life. Positive intervention is required  81-100% Bed-bound  These patients are either bed-bound or exaggerating their symptoms  Bluford FORBES Zoe DELENA Karon DELENA, et al. Surgery versus conservative management of stable thoracolumbar fracture: the PRESTO feasibility RCT. [place] ([place]): [employer]; 2021 Nov. [facility] Technology Assessment, No. 25.62.) Appendix 3, Oswestry Disability Index category descriptors. Available from: FindJewelers.cz  Minimally Clinically Important Difference (MCID) = 12.8%  COGNITION: Overall cognitive status: Within functional limits for tasks assessed     SENSATION: Patient reports no numbness or tingling  POSTURE: decreased lumbar lordosis  PALPATION: No tenderness to palpation reported  LUMBAR ROM:   AROM eval  Flexion 12  Extension 14  Right lateral flexion 75% limited  Left lateral flexion 75% limited  Right rotation 25% limited  Left rotation 50% limited   (Blank rows = not tested)  LOWER EXTREMITY ROM: WFL for activities assessed  LOWER EXTREMITY MMT:    MMT Right eval Left eval  Hip flexion 3+/5; pressure  3+/5; pressure   Hip extension    Hip abduction    Hip adduction    Hip internal rotation    Hip external rotation    Knee flexion 5/5 5/5  Knee extension 4+/5 4+/5  Ankle  dorsiflexion 4/5 4/5  Ankle plantarflexion    Ankle inversion    Ankle eversion     (Blank rows = not tested)  LUMBAR SPECIAL  TESTS:  Not tested due to surgical condition  GAIT: Assistive device utilized: Quad cane small base Level of assistance: Modified independence Comments: decreased stride length   TREATMENT DATE:                                                                                                                                12/10/23    EXERCISE LOG     LB fusion 07-25-23  Exercise Repetitions and Resistance Comments  Nustep     TM 1.8 MPH x 12 mins   XTS blue Rows 4 x 10 each with AB bracing   XTS  blue EXT 3 x 10 with AB bracing   SLR w/ hip ABD     Glute sets     Forward Step Ups  BLE supported on red ball  STS    Rocker board  3 mins   LAQs 3# 2 x 15 reps bil   Standing Marches 3# x 15 reps bil   Standing Ham Curls 3# x 15 reps bil   Standing Hip Abduction 3# x 15 reps bil Without UE supprt  BOSU Static    Side stepping on foam   Without UE support  Tandem Gait on foam     Blank cell = exercise not performed today                                     11/07/23 EXERCISE LOG  Exercise Repetitions and Resistance Comments  Nustep  L3 x 15 minutes                    Blank cell = exercise not performed today    PATIENT EDUCATION:  Education details: heat vs ice, HEP, and healing Person educated: Patient Education method: Explanation Education comprehension: verbalized understanding  HOME EXERCISE PROGRAM:   ASSESSMENT:  CLINICAL IMPRESSION: Pt arrives for today's treatment session denying any pain.  Pt introduced standing exercises today with pt requiring min cues for proper technique.  Pt requiring intermittent UE support with all standing exercises.  Pt challenged by BOSU ball activity.  Pt will be ready for discharge at next treatment session.  Pt denied any pain at completion of today's treatment session.  OBJECTIVE IMPAIRMENTS: Abnormal  gait, decreased activity tolerance, decreased mobility, decreased ROM, decreased strength, hypomobility, postural dysfunction, and pain.   ACTIVITY LIMITATIONS: lifting, bending, stairs, transfers, and locomotion level  PARTICIPATION LIMITATIONS: community activity and yard work  PERSONAL FACTORS: 1 comorbidity: Hypertension are also affecting patient's functional outcome.   REHAB POTENTIAL: Good  CLINICAL DECISION MAKING: Stable/uncomplicated  EVALUATION COMPLEXITY: Low   GOALS: Goals reviewed with patient? Yes  LONG TERM GOALS: Target date: 12/05/23  Patient will be independent with her HEP. Baseline:  Goal status: On going  2.  Patient will be able to safely ambulate for at least  80 feet without an assistive device for improved household mobility. Baseline:  Goal status: MET  3.  Patient will be able to navigate at least 4 steps with a reciprocal pattern for improved household mobility. Baseline:  Goal status: MET  4.  Patient will be able to transfer from sitting to standing without external support for improved independence. Baseline: Patient required upper extremity support for sit to stand transfers Goal status: MET  5.  Patient will improve her ODI score to 3/50 or less for improved perceived function with her daily activities. Baseline:7/3:3/50 Goal status: MET  6.  Patient will be able to vacuum without her familiar symptoms exceeding 3/10. Baseline:  Goal status: INITIAL   PLAN:  PT FREQUENCY: 2x/week  PT DURATION: 4 weeks  PLANNED INTERVENTIONS: 97164- PT Re-evaluation, 97750- Physical Performance Testing, 97110-Therapeutic exercises, 97530- Therapeutic activity, [id number]- Neuromuscular re-education, 97535- Self Care, 02859- Manual therapy, [phone]- Gait training, [phone]- Electrical stimulation (unattended), Patient/Family education, Balance training, Stair training, Joint mobilization, Spinal mobilization, Cryotherapy, and Moist heat.  PLAN FOR NEXT SESSION:  Nustep, lumbar and lower extremity strengthening, gait training, and balance interventions   Delon DELENA Gosling, PTA 12/10/2023, 9:50 AM

## 2023-12-12 ENCOUNTER — Ambulatory Visit

## 2023-12-12 DIAGNOSIS — R293 Abnormal posture: Secondary | ICD-10-CM | POA: Diagnosis not present

## 2023-12-12 DIAGNOSIS — M6281 Muscle weakness (generalized): Secondary | ICD-10-CM

## 2023-12-12 DIAGNOSIS — M5459 Other low back pain: Secondary | ICD-10-CM

## 2023-12-12 IMAGING — DX DG LUMBAR SPINE 2-3V
3 series · 3 of 3 positions shown · non-contrast
Comparison: None.

CLINICAL DATA: Low back pain x 5 months.

EXAM:
LUMBAR SPINE - 2-3 VIEW

[l-spine ap]
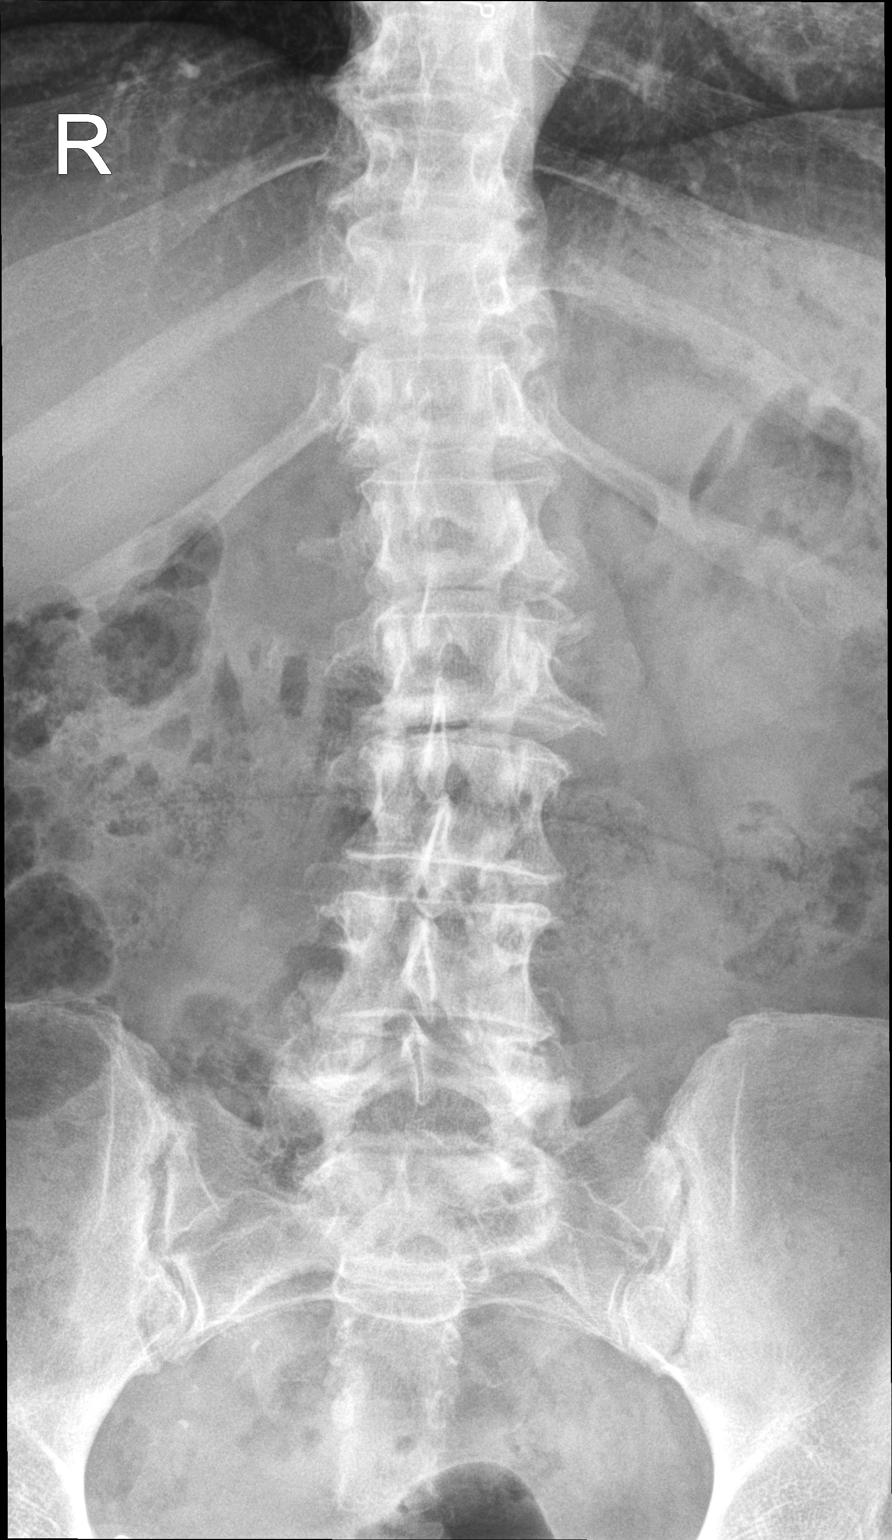

[l-spine lat (1 of 2)]
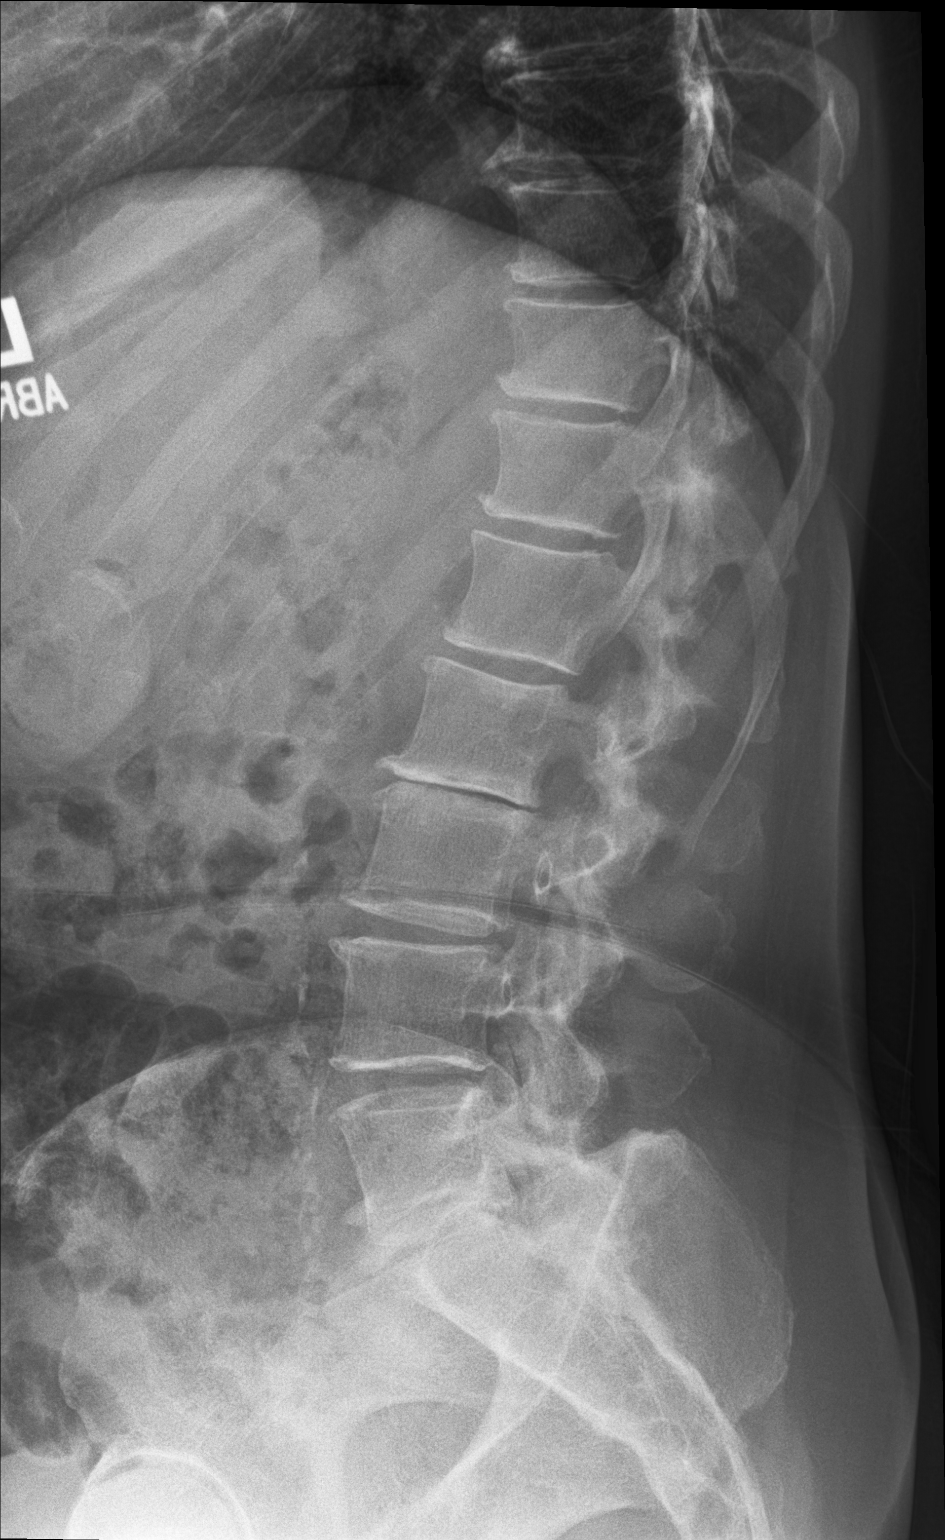

[l-spine lat (2 of 2)]
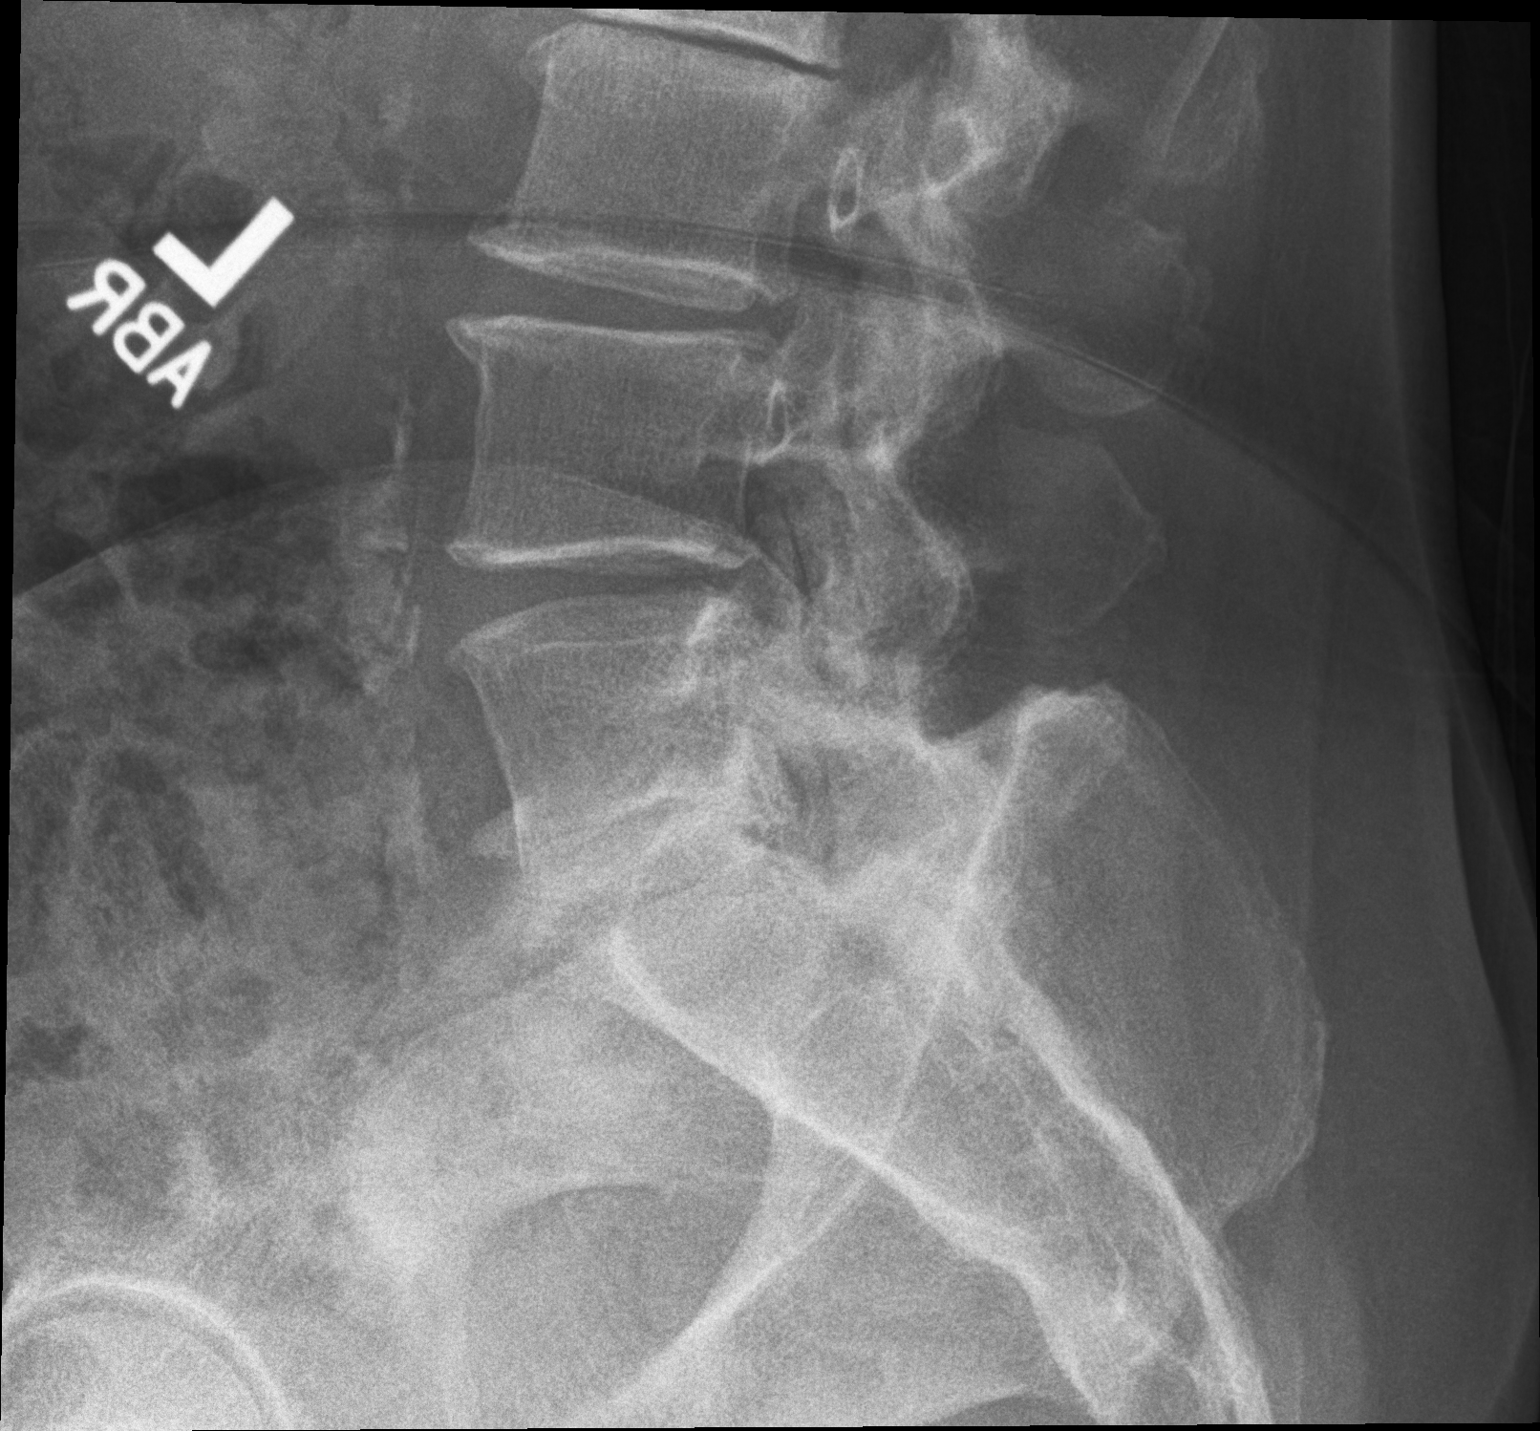

[3 of 3 positions shown; findings below may reference images not displayed]

FINDINGS: Frontal and lateral views of the lumbar spine are obtained. There is
mild S-shaped scoliosis, left convex at the L2-3 level. Otherwise
alignment is anatomic. There are no acute fractures. Multilevel
lumbar spondylosis and facet hypertrophy greatest at L2-3 and L5-S1.
Sacroiliac joints are normal.
IMPRESSION: 1. Multilevel spondylosis and facet hypertrophy.
2. Mild S shaped scoliosis.
3. No acute fracture.

## 2023-12-12 NOTE — Therapy (Addendum)
 OUTPATIENT PHYSICAL THERAPY THORACOLUMBAR TREATMENT   Patient Name: Veronica Gomez MRN: [id number] DOB:[birth year], 68 y.o., female Today's Date: 12/12/2023  END OF SESSION:  PT End of Session - 12/12/23 0934     Visit Number 12    Number of Visits 12    Date for PT Re-Evaluation 01/11/24    PT Start Time 0930    PT Stop Time 1012    PT Time Calculation (min) 42 min            Past Medical History:  Diagnosis Date   CAD (coronary artery disease)    Cardiomyopathy (HCC)    GERD (gastroesophageal reflux disease)    Hyperlipidemia    Hypertension    Ischemia    Left bundle branch block (LBBB)    Past Surgical History:  Procedure Laterality Date   BACK SURGERY  07/25/2023   CERVICAL POLYPECTOMY  05/15/2008   FINGER SURGERY  05/15/2006   SHOULDER SURGERY Right 05/15/2012   TUBAL LIGATION  05/15/1986   Patient Active Problem List   Diagnosis Date Noted   Chronic bilateral low back pain without sciatica 02/03/2022   Combined forms of age-related cataract of left eye 10/15/2018   Regular astigmatism, left eye 10/15/2018   Chronic heart disease 10/08/2018   Gastroesophageal reflux disease 07/25/2017   Allergic rhinitis due to allergen 08/25/2016   Overweight (BMI 25.0-29.9) 09/14/2014   HTN (hypertension) 11/20/2013   Dyslipidemia 11/20/2013   Cardiomyopathy, ischemic 11/20/2013   LBBB (left bundle branch block) 11/20/2013    PCP: Lavell Bari LABOR, FNP  REFERRING PROVIDER: Moffo, Katelyn Rose, PA-C   REFERRING DIAG: S/P T10-Pelvis-DOS 07/25/23-Back Pain   Rationale for Evaluation and Treatment: Rehabilitation  THERAPY DIAG:  Other low back pain  Abnormal posture  Muscle weakness (generalized)  ONSET DATE: 07/25/23  SUBJECTIVE:                                                                                                                                                                                           SUBJECTIVE STATEMENT: Pt denies any pain  today.  Pt ready for discharge today.  PERTINENT HISTORY:  Hypertension  PAIN:  Are you having pain? No  PRECAUTIONS: Back; gradually progress to lifting 40 pounds  RED FLAGS: None   WEIGHT BEARING RESTRICTIONS: No  FALLS:  Has patient fallen in last 6 months? No  LIVING ENVIRONMENT: Lives with: lives with their spouse Lives in: House/apartment Stairs: Yes: External: 6 steps; can reach both; step to pattern Has following equipment at home: Quad cane small base  OCCUPATION: retired  PLOF: Independent  PATIENT GOALS: be able to go up  down steps easier, improved ease with transfers, be able to travel, and be able to pick up things from the floor   NEXT MD VISIT: 01/18/24  OBJECTIVE:  Note: Objective measures were completed at Evaluation unless otherwise noted.  PATIENT SURVEYS:  Modified Oswestry:  MODIFIED OSWESTRY DISABILITY SCALE  Date: 11/07/23 Score  Pain intensity 0 = I can tolerate the pain I have without having to use pain medication.  2. Personal care (washing, dressing, etc.) 0 =  I can take care of myself normally without causing increased pain.  3. Lifting 1 = I can lift heavy weights, but it causes increased pain.  4. Walking 0 = Pain does not prevent me from walking any distance  5. Sitting 1 =  I can only sit in my favorite chair as long as I like.  6. Standing 1 =  I can stand as long as I want but, it increases my pain.  7. Sleeping 0 = Pain does not prevent me from sleeping well.  8. Social Life 0 = My social life is normal and does not increase my pain.  9. Traveling 3 = My pain restricts my travel over 1 hour  10. Employment/ Homemaking 2 = I can perform most of my homemaking/job duties, but pain prevents me from performing more physically stressful activities (eg, lifting, vacuuming).  Total 8/50   Interpretation of scores: Score Category Description  0-20% Minimal Disability The patient can cope with most living activities. Usually no treatment is  indicated apart from advice on lifting, sitting and exercise  21-40% Moderate Disability The patient experiences more pain and difficulty with sitting, lifting and standing. Travel and social life are more difficult and they may be disabled from work. Personal care, sexual activity and sleeping are not grossly affected, and the patient can usually be managed by conservative means  41-60% Severe Disability Pain remains the main problem in this group, but activities of daily living are affected. These patients require a detailed investigation  61-80% Crippled Back pain impinges on all aspects of the patient's life. Positive intervention is required  81-100% Bed-bound  These patients are either bed-bound or exaggerating their symptoms  Bluford FORBES Zoe DELENA Karon DELENA, et al. Surgery versus conservative management of stable thoracolumbar fracture: the PRESTO feasibility RCT. [place] ([place]): [employer]; 2021 Nov. [facility] Technology Assessment, No. 25.62.) Appendix 3, Oswestry Disability Index category descriptors. Available from: FindJewel[employer].cz  Minimally Clinically Important Difference (MCID) = 12.8%  COGNITION: Overall cognitive status: Within functional limits for tasks assessed     SENSATION: Patient reports no numbness or tingling  POSTURE: decreased lumbar lordosis  PALPATION: No tenderness to palpation reported  LUMBAR ROM:   AROM eval  Flexion 12  Extension 14  Right lateral flexion 75% limited  Left lateral flexion 75% limited  Right rotation 25% limited  Left rotation 50% limited   (Blank rows = not tested)  LOWER EXTREMITY ROM: WFL for activities assessed  LOWER EXTREMITY MMT:    MMT Right eval Left eval  Hip flexion 3+/5; pressure  3+/5; pressure   Hip extension    Hip abduction    Hip adduction    Hip internal rotation    Hip external rotation    Knee flexion 5/5 5/5  Knee extension 4+/5 4+/5  Ankle dorsiflexion 4/5  4/5  Ankle plantarflexion    Ankle inversion    Ankle eversion     (Blank rows = not tested)  LUMBAR SPECIAL TESTS:  Not  tested due to surgical condition  GAIT: Assistive device utilized: Quad cane small base Level of assistance: Modified independence Comments: decreased stride length   TREATMENT DATE:                                                                                                                                12/12/23    EXERCISE LOG     LB fusion 07-25-23  Exercise Repetitions and Resistance Comments  Nustep     TM 1.8 MPH x 15 mins   XTS blue Rows 4 x 10 each with AB bracing   XTS  blue EXT 3 x 15 with AB bracing   SLR w/ hip ABD     Glute sets     Forward Step Ups  BLE supported on red ball  STS    Rocker board  3 mins   LAQs 3# 2 x 15 reps bil   Standing Marches 3# x 20 reps bil   Standing Ham Curls 3# x 20 reps bil   Standing Hip Abduction 3# x 20 reps bil Without UE supprt  BOSU Static    Side stepping on foam   Without UE support  Tandem Gait on foam     Blank cell = exercise not performed today                                     11/07/23 EXERCISE LOG  Exercise Repetitions and Resistance Comments  Nustep  L3 x 15 minutes                    Blank cell = exercise not performed today    PATIENT EDUCATION:  Education details: heat vs ice, HEP, and healing Person educated: Patient Education method: Explanation Education comprehension: verbalized understanding  HOME EXERCISE PROGRAM: [email or web address]  Access Code: [email or web address]  ASSESSMENT:  CLINICAL IMPRESSION: Pt arrives for today's treatment session denying any pain.  Pt states she has been able to vacuuming numerous times without any pain.  Pt able to tolerate increased time on the treadmill today without pain or discomfort.  Pt given printed HEP and t-band for home use.  Pt has met all of the goals set forth for her by physical therapy at this time.  Pt encouraged to  call the facility with any questions or concerns.  Pt denied any pain at completion of today's treatment session.  Pt ready for discharge at this time.   PHYSICAL THERAPY DISCHARGE SUMMARY  Visits from Start of Care: 12  Current functional level related to goals / functional outcomes: Patient was able to meet all of her goals for skilled physical therapy.    Remaining deficits: None   Education / Equipment: HEP    Patient agrees to discharge. Patient goals were met. Patient is being discharged due to meeting the stated rehab goals.  Lacinda Fass, PT, DPT    OBJECTIVE IMPAIRMENTS: Abnormal gait, decreased activity tolerance, decreased mobility, decreased ROM, decreased strength, hypomobility, postural dysfunction, and pain.   ACTIVITY LIMITATIONS: lifting, bending, stairs, transfers, and locomotion level  PARTICIPATION LIMITATIONS: community activity and yard work  PERSONAL FACTORS: 1 comorbidity: Hypertension are also affecting patient's functional outcome.   REHAB POTENTIAL: Good  CLINICAL DECISION MAKING: Stable/uncomplicated  EVALUATION COMPLEXITY: Low   GOALS: Goals reviewed with patient? Yes  LONG TERM GOALS: Target date: 12/05/23  Patient will be independent with her HEP. Baseline:  Goal status: MET  2.  Patient will be able to safely ambulate for at least 80 feet without an assistive device for improved household mobility. Baseline:  Goal status: MET  3.  Patient will be able to navigate at least 4 steps with a reciprocal pattern for improved household mobility. Baseline:  Goal status: MET  4.  Patient will be able to transfer from sitting to standing without external support for improved independence. Baseline: Patient required upper extremity support for sit to stand transfers Goal status: MET  5.  Patient will improve her ODI score to 3/50 or less for improved perceived function with her daily activities. Baseline:7/3:3/50 Goal status: MET  6.   Patient will be able to vacuum without her familiar symptoms exceeding 3/10. Baseline:  Goal status: MET   PLAN:  PT FREQUENCY: 2x/week  PT DURATION: 4 weeks  PLANNED INTERVENTIONS: 97164- PT Re-evaluation, 97750- Physical Performance Testing, 97110-Therapeutic exercises, 97530- Therapeutic activity, 97112- Neuromuscular re-education, 97535- Self Care, 02859- Manual therapy, [phone]- Gait training, [phone]- Electrical stimulation (unattended), Patient/Family education, Balance training, Stair training, Joint mobilization, Spinal mobilization, Cryotherapy, and Moist heat.  PLAN FOR NEXT SESSION: Nustep, lumbar and lower extremity strengthening, gait training, and balance interventions   Delon DELENA Gosling, PTA 12/12/2023, 10:21 AM

## 2023-12-14 ENCOUNTER — Other Ambulatory Visit

## 2023-12-17 ENCOUNTER — Other Ambulatory Visit

## 2023-12-17 DIAGNOSIS — Z79899 Other long term (current) drug therapy: Secondary | ICD-10-CM | POA: Diagnosis not present

## 2023-12-18 ENCOUNTER — Ambulatory Visit: Payer: Self-pay | Admitting: Internal Medicine

## 2023-12-18 DIAGNOSIS — R7989 Other specified abnormal findings of blood chemistry: Secondary | ICD-10-CM

## 2023-12-18 LAB — BASIC METABOLIC PANEL WITH GFR
BUN/Creatinine Ratio: 17 (ref 12–28)
BUN: 19 mg/dL (ref 8–27)
CO2: 18 mmol/L — AB (ref 20–29)
Calcium: 9.4 mg/dL (ref 8.7–10.3)
Chloride: 106 mmol/L (ref 96–106)
Creatinine, Ser: 1.13 mg/dL — AB (ref 0.57–1.00)
Glucose: 73 mg/dL (ref 70–99)
Potassium: 4.4 mmol/L (ref 3.5–5.2)
Sodium: 142 mmol/L (ref 134–144)
eGFR: 52 mL/min/1.73 — AB (ref >59)

## 2023-12-18 MED ORDER — SPIRONOLACTONE 25 MG PO TABS: 12.5000 mg | ORAL_TABLET | Freq: Every day | ORAL | Status: AC

## 2023-12-18 NOTE — Telephone Encounter (Signed)
 Pt returning call

## 2023-12-18 NOTE — Telephone Encounter (Signed)
  Veronica JAYSON Maxcy, MD 12/18/2023  9:40 AM EDT     Creatinine up slightly - encourage hydration.  Would decrease aldactone  to 12.5 mg daily (1/2 tablet) -repeat BMET in 1-2 weeks.   Dr. Maxcy Sous over results with the patient. She will cut her current pills in half with a pill cutter, does not need prescription sent at this time.  She will get lab work in about 10 days at [facility] in [place].   She verbalized understanding of all information.  Medication list updated and order for BMP placed and released.

## 2023-12-28 ENCOUNTER — Other Ambulatory Visit

## 2023-12-28 DIAGNOSIS — R7989 Other specified abnormal findings of blood chemistry: Secondary | ICD-10-CM | POA: Diagnosis not present

## 2023-12-29 LAB — BASIC METABOLIC PANEL WITH GFR
BUN/Creatinine Ratio: 6 — ABNORMAL LOW (ref 12–28)
BUN: 7 mg/dL — ABNORMAL LOW (ref 8–27)
CO2: 19 mmol/L — ABNORMAL LOW (ref 20–29)
Calcium: 9.4 mg/dL (ref 8.7–10.3)
Chloride: 107 mmol/L — ABNORMAL HIGH (ref 96–106)
Creatinine, Ser: 1.09 mg/dL — ABNORMAL HIGH (ref 0.57–1.00)
Glucose: 79 mg/dL (ref 70–99)
Potassium: 5 mmol/L (ref 3.5–5.2)
Sodium: 142 mmol/L (ref 134–144)
eGFR: 55 mL/min/1.73 — ABNORMAL LOW (ref >59)

## 2024-01-01 NOTE — Telephone Encounter (Signed)
Pt calling in for lab results

## 2024-01-02 ENCOUNTER — Encounter: Payer: Self-pay | Admitting: Podiatry

## 2024-01-02 ENCOUNTER — Ambulatory Visit: Admitting: Podiatry

## 2024-01-02 DIAGNOSIS — M79674 Pain in right toe(s): Secondary | ICD-10-CM | POA: Diagnosis not present

## 2024-01-02 DIAGNOSIS — M79675 Pain in left toe(s): Secondary | ICD-10-CM | POA: Diagnosis not present

## 2024-01-02 DIAGNOSIS — B351 Tinea unguium: Secondary | ICD-10-CM

## 2024-01-02 NOTE — Progress Notes (Signed)
 This patient presents to the office with chief complaint of long thick painful nails.  Patient says the nails are painful walking and wearing shoes.  This patient is unable to self treat.  This patient is unable to trim her nails since she is unable to reach her nails.  She presents to the office for preventative foot care services.  General Appearance  Alert, conversant and in no acute stress.  Vascular  Dorsalis pedis and posterior tibial  pulses are palpable  bilaterally.  Capillary return is within normal limits  bilaterally. Temperature is within normal limits  bilaterally.  Neurologic  Senn-Weinstein monofilament wire test within normal limits  bilaterally. Muscle power within normal limits bilaterally.  Nails Thick disfigured discolored nails with subungual debris  from hallux to fifth toes bilaterally. No evidence of bacterial infection or drainage bilaterally.  Orthopedic  No limitations of motion  feet .  No crepitus or effusions noted.  No bony pathology or digital deformities noted.  Skin  normotropic skin with no porokeratosis noted bilaterally.  No signs of infections or ulcers noted.     Onychomycosis  Nails  B/L.  Pain in right toes  Pain in left toes  Debridement of nails both feet followed trimming the nails with dremel tool.  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  Tobie.  RTC 3 months.   Cordella Bold DPM

## 2024-01-02 NOTE — Telephone Encounter (Signed)
 Letter of results sent to pt

## 2024-01-18 DIAGNOSIS — M532X6 Spinal instabilities, lumbar region: Secondary | ICD-10-CM | POA: Diagnosis not present

## 2024-01-18 DIAGNOSIS — M48062 Spinal stenosis, lumbar region with neurogenic claudication: Secondary | ICD-10-CM | POA: Diagnosis not present

## 2024-01-18 DIAGNOSIS — M791 Myalgia, unspecified site: Secondary | ICD-10-CM | POA: Diagnosis not present

## 2024-01-18 DIAGNOSIS — M4325 Fusion of spine, thoracolumbar region: Secondary | ICD-10-CM | POA: Diagnosis not present

## 2024-01-18 DIAGNOSIS — M41126 Adolescent idiopathic scoliosis, lumbar region: Secondary | ICD-10-CM | POA: Diagnosis not present

## 2024-01-18 DIAGNOSIS — M4156 Other secondary scoliosis, lumbar region: Secondary | ICD-10-CM | POA: Diagnosis not present

## 2024-02-04 ENCOUNTER — Other Ambulatory Visit: Payer: Self-pay

## 2024-02-05 DIAGNOSIS — Z1231 Encounter for screening mammogram for malignant neoplasm of breast: Secondary | ICD-10-CM | POA: Diagnosis not present

## 2024-02-05 MED ORDER — SACUBITRIL-VALSARTAN 97-103 MG PO TABS: 1.0000 | ORAL_TABLET | Freq: Two times a day (BID) | ORAL | 1 refills | Status: DC

## 2024-02-20 ENCOUNTER — Ambulatory Visit: Admitting: Podiatry

## 2024-02-20 DIAGNOSIS — B351 Tinea unguium: Secondary | ICD-10-CM

## 2024-02-20 DIAGNOSIS — Z79899 Other long term (current) drug therapy: Secondary | ICD-10-CM | POA: Diagnosis not present

## 2024-02-20 NOTE — Progress Notes (Signed)
 Subjective:  Patient ID: Veronica Gomez, female    DOB: October 29, 1955,  MRN: [id number]  Chief Complaint  Patient presents with   Nail Problem    Nail fungus follow up     68 y.o. female presents with the above complaint.  Patient presents with thickened likely dystrophic mycotic toenails x 10 she states is improving a little bit she was able to tolerate the Lamisil  medication  Review of Systems: Negative except as noted in the HPI. Denies N/V/F/Ch.  Past Medical History:  Diagnosis Date   CAD (coronary artery disease)    Cardiomyopathy (HCC)    GERD (gastroesophageal reflux disease)    Hyperlipidemia    Hypertension    Ischemia    Left bundle branch block (LBBB)     Current Outpatient Medications:    Ascorbic Acid (VITAMIN C PO), Vitamin C Chewable 500 mg tablet   1 tablet every day by oral route., Disp: , Rfl:    ascorbic acid (VITAMIN C) 500 MG tablet, Take 500 mg by mouth., Disp: , Rfl:    aspirin 81 MG tablet, Take 81 mg by mouth daily., Disp: , Rfl:    atorvastatin  (LIPITOR) 40 MG tablet, TAKE 1 TABLET BY MOUTH DAILY, Disp: 100 tablet, Rfl: 1   baclofen  (LIORESAL ) 10 MG tablet, Take 1 tablet (10 mg total) by mouth 3 (three) times daily., Disp: 270 tablet, Rfl: 0   Calcium  Carb-Cholecalciferol (OYSTER SHELL CALCIUM  250+D) 250-3.125 MG-MCG TABS, Take 1 tablet by mouth., Disp: , Rfl:    carvedilol  (COREG ) 6.25 MG tablet, TAKE 1 TABLET BY MOUTH TWICE  DAILY WITH A MEAL, Disp: 200 tablet, Rfl: 1   Cholecalciferol (VITAMIN D3) 25 MCG (1000 UT) CAPS, Take by mouth daily., Disp: , Rfl:    cyanocobalamin (VITAMIN B12) 1000 MCG tablet, Take 3,000 mcg by mouth daily., Disp: , Rfl:    diclofenac  (VOLTAREN ) 75 MG EC tablet, TAKE 1 TABLET BY MOUTH TWICE  DAILY, Disp: 200 tablet, Rfl: 1   diclofenac  Sodium (VOLTAREN ) 1 % GEL, Apply 2 g topically 4 (four) times daily., Disp: 350 g, Rfl: 1   diphenhydrAMINE (BENADRYL) 25 mg capsule, Take 25 mg by mouth., Disp: , Rfl:     HYDROcodone-acetaminophen (NORCO/VICODIN) 5-325 MG tablet, Take 1 tablet by mouth every 4 (four) hours as needed for moderate pain (pain score 4-6)., Disp: , Rfl:    melatonin 3 MG TABS tablet, Take 3 mg by mouth., Disp: , Rfl:    Melatonin-Pyridoxine (MELATIN PO), Take by mouth., Disp: , Rfl:    omeprazole  (PRILOSEC) 20 MG capsule, TAKE 1 CAPSULE BY MOUTH DAILY, Disp: 100 capsule, Rfl: 1   sacubitril -valsartan  (ENTRESTO ) 97-103 MG, Take 1 tablet by mouth 2 (two) times daily., Disp: 180 tablet, Rfl: 1   spironolactone  (ALDACTONE ) 25 MG tablet, Take 0.5 tablets (12.5 mg total) by mouth daily., Disp: , Rfl:    terbinafine  (LAMISIL ) 250 MG tablet, Take 1 tablet (250 mg total) by mouth daily., Disp: 90 tablet, Rfl: 0   terbinafine  (LAMISIL ) 250 MG tablet, Take 1 tablet (250 mg total) by mouth daily., Disp: 90 tablet, Rfl: 0   tiZANidine (ZANAFLEX) 4 MG tablet, Take 2-4 mg by mouth every 8 (eight) hours as needed., Disp: , Rfl:   Social History   Tobacco Use  Smoking Status Former   Current packs/day: 0.00   Types: Cigarettes   Quit date: 05/15/1988   Years since quitting: 35.7  Smokeless Tobacco Never    Allergies  Allergen Reactions   Farxiga  [  Dapagliflozin ]     myalgias   Jardiance  [Empagliflozin ]     Yeast    Objective:  There were no vitals filed for this visit. There is no height or weight on file to calculate BMI. Constitutional Well developed. Well nourished.  Vascular Dorsalis pedis pulses palpable bilaterally. Posterior tibial pulses palpable bilaterally. Capillary refill normal to all digits.  No cyanosis or clubbing noted. Pedal hair growth normal.  Neurologic Normal speech. Oriented to person, place, and time. Epicritic sensation to light touch grossly present bilaterally.  Dermatologic Nails thickened onychodystrophy mycotic toenails x 10 Skin within normal limits  Orthopedic: Normal joint ROM without pain or crepitus bilaterally. No visible deformities. No bony  tenderness.   Radiographs: None Assessment:   1. Long-term use of high-risk medication    Plan:  Patient was evaluated and treated and all questions answered.  Onychomycosis toenails x 7~ second round -Educated the patient on the etiology of onychomycosis and various treatment options associated with improving the fungal load.  I explained to the patient that there is 3 treatment options available to treat the onychomycosis including topical, p.o., laser treatment.  Patient elected to undergo p.o. options with Lamisil /terbinafine  therapy.  In order for me to start the medication therapy, I explained to the patient the importance of evaluating the liver and obtaining the liver function test.  Once the liver function test comes back normal I will start him on 58-month course of Lamisil  therapy.  Patient understood all risk and would like to proceed with Lamisil  therapy.  I have asked the patient to immediately stop the Lamisil  therapy if she has any reactions to it and call the office or go to the emergency room right away.  Patient states understanding   No follow-ups on file.

## 2024-02-21 ENCOUNTER — Other Ambulatory Visit

## 2024-02-21 DIAGNOSIS — Z79899 Other long term (current) drug therapy: Secondary | ICD-10-CM | POA: Diagnosis not present

## 2024-02-22 LAB — HEPATIC FUNCTION PANEL
ALT: 12 IU/L (ref 0–32)
AST: 10 IU/L (ref 0–40)
Albumin: 4.4 g/dL (ref 3.9–4.9)
Alkaline Phosphatase: 82 IU/L (ref 49–135)
Bilirubin Total: 0.3 mg/dL (ref 0.0–1.2)
Bilirubin, Direct: 0.13 mg/dL (ref 0.00–0.40)
Total Protein: 6.9 g/dL (ref 6.0–8.5)

## 2024-02-22 MED ORDER — TERBINAFINE HCL 250 MG PO TABS: 250.0000 mg | ORAL_TABLET | Freq: Every day | ORAL | 0 refills | Status: DC

## 2024-02-22 NOTE — Addendum Note (Signed)
 Addended by: Reatha Sur on: 02/22/2024 07:58 AM   Modules accepted: Orders

## 2024-03-11 ENCOUNTER — Encounter: Payer: Self-pay | Admitting: Orthopedic Surgery

## 2024-03-11 ENCOUNTER — Ambulatory Visit: Admitting: Orthopedic Surgery

## 2024-03-11 ENCOUNTER — Other Ambulatory Visit (INDEPENDENT_AMBULATORY_CARE_PROVIDER_SITE_OTHER): Payer: Self-pay

## 2024-03-11 VITALS — BP 156/81 | HR 75 | Ht 63.0 in | Wt 180.0 lb

## 2024-03-11 DIAGNOSIS — M1712 Unilateral primary osteoarthritis, left knee: Secondary | ICD-10-CM

## 2024-03-11 DIAGNOSIS — M25562 Pain in left knee: Secondary | ICD-10-CM

## 2024-03-11 NOTE — Patient Instructions (Signed)

## 2024-03-11 NOTE — Progress Notes (Unsigned)
 New Patient Visit  Assessment: Veronica Gomez is a 68 y.o. female with the following: 1. Arthritis of left knee   Plan: Christina Gintz has pain in her left knee.  Acute onset.  She has never had pain this severe.  She is currently using a cane to assist with ambulation.  Radiographs obtained in clinic today were reviewed with the patient, and these demonstrate some advanced degenerative changes, with near complete loss of joint space within the medial compartment of the left knee.  We discussed multiple treatment options.  I provided her with an injection.  This was completed in clinic today.  She tolerated this well.  If she continues to have issues, we may have to consider obtaining an MRI.   Procedure note injection Left knee joint   Verbal consent was obtained to inject the left knee joint  Timeout was completed to confirm the site of injection.  The skin was prepped with alcohol and ethyl chloride was sprayed at the injection site.  A 21-gauge needle was used to inject 40 mg of Depo-Medrol and 1% lidocaine (4 cc) into the left knee using an anterolateral approach.  There were no complications. A sterile bandage was applied.     Follow-up: Return if symptoms worsen or fail to improve.  Subjective:  Chief Complaint  Patient presents with   Knee Pain    Left x2 weeks  NDC:[address]-1    History of Present Illness: Veronica Gomez is a 68 y.o. female who presents for evaluation of left knee pain.  She is complaining of diffuse left knee pain.  This has been bothering her for the past 2 weeks.  No specific injury.  No history of an injury to the left knee.  The pain is severe enough that she uses a cane to assist with ambulation.  Pain is primarily medial.  She has not noticed any significant swelling.  Medications have been insufficient.  She has not had an injection in her left knee.   Review of Systems: No fevers or chills No numbness or tingling No chest pain No shortness of  breath No bowel or bladder dysfunction No GI distress No headaches   Medical History:  Past Medical History:  Diagnosis Date   CAD (coronary artery disease)    Cardiomyopathy (HCC)    GERD (gastroesophageal reflux disease)    Hyperlipidemia    Hypertension    Ischemia    Left bundle branch block (LBBB)     Past Surgical History:  Procedure Laterality Date   BACK SURGERY  07/25/2023   CERVICAL POLYPECTOMY  05/15/2008   FINGER SURGERY  05/15/2006   SHOULDER SURGERY Right 05/15/2012   TUBAL LIGATION  05/15/1986    Family History  Problem Relation Age of Onset   Osteoporosis Mother    Asthma Mother    Hypertension Mother    Hypertension Father    Heart disease Father        MI   Social History   Tobacco Use   Smoking status: Former    Current packs/day: 0.00    Types: Cigarettes    Quit date: 05/15/1988    Years since quitting: 35.8   Smokeless tobacco: Never  Vaping Use   Vaping status: Never Used  Substance Use Topics   Alcohol use: Yes    Comment: occasionally - beer   Drug use: No    Allergies  Allergen Reactions   Farxiga  [Dapagliflozin ]     myalgias   Jardiance  [Empagliflozin ]  Yeast     Current Meds  Medication Sig   Ascorbic Acid (VITAMIN C PO) Vitamin C Chewable 500 mg tablet   1 tablet every day by oral route.   ascorbic acid (VITAMIN C) 500 MG tablet Take 500 mg by mouth.   aspirin 81 MG tablet Take 81 mg by mouth daily.   atorvastatin  (LIPITOR) 40 MG tablet TAKE 1 TABLET BY MOUTH DAILY   baclofen  (LIORESAL ) 10 MG tablet Take 1 tablet (10 mg total) by mouth 3 (three) times daily.   Calcium  Carb-Cholecalciferol (OYSTER SHELL CALCIUM  250+D) 250-3.125 MG-MCG TABS Take 1 tablet by mouth.   carvedilol  (COREG ) 6.25 MG tablet TAKE 1 TABLET BY MOUTH TWICE  DAILY WITH A MEAL   Cholecalciferol (VITAMIN D3) 25 MCG (1000 UT) CAPS Take by mouth daily.   cyanocobalamin (VITAMIN B12) 1000 MCG tablet Take 3,000 mcg by mouth daily.   diclofenac   (VOLTAREN ) 75 MG EC tablet TAKE 1 TABLET BY MOUTH TWICE  DAILY   diclofenac  Sodium (VOLTAREN ) 1 % GEL Apply 2 g topically 4 (four) times daily.   diphenhydrAMINE (BENADRYL) 25 mg capsule Take 25 mg by mouth.   HYDROcodone-acetaminophen (NORCO/VICODIN) 5-325 MG tablet Take 1 tablet by mouth every 4 (four) hours as needed for moderate pain (pain score 4-6).   melatonin 3 MG TABS tablet Take 3 mg by mouth.   Melatonin-Pyridoxine (MELATIN PO) Take by mouth.   omeprazole  (PRILOSEC) 20 MG capsule TAKE 1 CAPSULE BY MOUTH DAILY   sacubitril -valsartan  (ENTRESTO ) 97-103 MG Take 1 tablet by mouth 2 (two) times daily.   spironolactone  (ALDACTONE ) 25 MG tablet Take 0.5 tablets (12.5 mg total) by mouth daily.   terbinafine  (LAMISIL ) 250 MG tablet Take 1 tablet (250 mg total) by mouth daily.   terbinafine  (LAMISIL ) 250 MG tablet Take 1 tablet (250 mg total) by mouth daily.   terbinafine  (LAMISIL ) 250 MG tablet Take 1 tablet (250 mg total) by mouth daily.   tiZANidine (ZANAFLEX) 4 MG tablet Take 2-4 mg by mouth every 8 (eight) hours as needed.    Objective: BP (!) 156/81   Pulse 75   Ht 5' 3 (1.6 m)   Wt 180 lb (81.6 kg)   LMP 09/13/2008 (Approximate)   BMI 31.89 kg/m   Physical Exam:  General: Alert and oriented. and No acute distress. Gait: Ambulates with the assistance of a cane  Left knee without significant swelling.  Tenderness palpation along the medial joint line.  Neutral overall alignment.  Negative Lachman.  No increased laxity varus valgus stress.  Sensation is intact distally.  IMAGING: I personally ordered and reviewed the following images  X-rays of the left knee were obtained in clinic today.  No acute injuries are noted.  Mild varus alignment.  Near complete loss of joint space within the medial compartment.  There are some small associated osteophytes.  No bony lesions.  Impression: Moderate left knee arthritis  New Medications:  No orders of the defined types were placed in  this encounter.     Oneil DELENA Horde, MD  03/12/2024 7:36 PM

## 2024-03-12 ENCOUNTER — Encounter: Payer: Self-pay | Admitting: Orthopedic Surgery

## 2024-03-28 ENCOUNTER — Other Ambulatory Visit: Payer: Self-pay | Admitting: Internal Medicine

## 2024-03-31 ENCOUNTER — Ambulatory Visit: Payer: Self-pay | Admitting: Family

## 2024-03-31 ENCOUNTER — Encounter: Payer: Self-pay | Admitting: Family

## 2024-03-31 VITALS — BP 132/79 | HR 69 | Temp 97.8°F | Ht 63.0 in | Wt 186.4 lb

## 2024-03-31 DIAGNOSIS — I10 Essential (primary) hypertension: Secondary | ICD-10-CM

## 2024-03-31 DIAGNOSIS — Z0001 Encounter for general adult medical examination with abnormal findings: Secondary | ICD-10-CM

## 2024-03-31 DIAGNOSIS — G8929 Other chronic pain: Secondary | ICD-10-CM

## 2024-03-31 DIAGNOSIS — K219 Gastro-esophageal reflux disease without esophagitis: Secondary | ICD-10-CM | POA: Diagnosis not present

## 2024-03-31 DIAGNOSIS — I255 Ischemic cardiomyopathy: Secondary | ICD-10-CM

## 2024-03-31 DIAGNOSIS — E669 Obesity, unspecified: Secondary | ICD-10-CM | POA: Diagnosis not present

## 2024-03-31 DIAGNOSIS — Z0000 Encounter for general adult medical examination without abnormal findings: Secondary | ICD-10-CM

## 2024-03-31 DIAGNOSIS — M545 Low back pain, unspecified: Secondary | ICD-10-CM

## 2024-03-31 DIAGNOSIS — E785 Hyperlipidemia, unspecified: Secondary | ICD-10-CM

## 2024-03-31 LAB — LIPID PANEL

## 2024-03-31 NOTE — Patient Instructions (Signed)
 Health Maintenance After Age 70 After age 27, you are at a higher risk for certain long-term diseases and infections as well as injuries from falls. Falls are a major cause of broken bones and head injuries in people who are older than age 70. Getting regular preventive care can help to keep you healthy and well. Preventive care includes getting regular testing and making lifestyle changes as recommended by your health care provider. Talk with your health care provider about: Which screenings and tests you should have. A screening is a test that checks for a disease when you have no symptoms. A diet and exercise plan that is right for you. What should I know about screenings and tests to prevent falls? Screening and testing are the best ways to find a health problem early. Early diagnosis and treatment give you the best chance of managing medical conditions that are common after age 70. Certain conditions and lifestyle choices may make you more likely to have a fall. Your health care provider may recommend: Regular vision checks. Poor vision and conditions such as cataracts can make you more likely to have a fall. If you wear glasses, make sure to get your prescription updated if your vision changes. Medicine review. Work with your health care provider to regularly review all of the medicines you are taking, including over-the-counter medicines. Ask your health care provider about any side effects that may make you more likely to have a fall. Tell your health care provider if any medicines that you take make you feel dizzy or sleepy. Strength and balance checks. Your health care provider may recommend certain tests to check your strength and balance while standing, walking, or changing positions. Foot health exam. Foot pain and numbness, as well as not wearing proper footwear, can make you more likely to have a fall. Screenings, including: Osteoporosis screening. Osteoporosis is a condition that causes  the bones to get weaker and break more easily. Blood pressure screening. Blood pressure changes and medicines to control blood pressure can make you feel dizzy. Depression screening. You may be more likely to have a fall if you have a fear of falling, feel depressed, or feel unable to do activities that you used to do. Alcohol  use screening. Using too much alcohol  can affect your balance and may make you more likely to have a fall. Follow these instructions at home: Lifestyle Do not drink alcohol  if: Your health care provider tells you not to drink. If you drink alcohol : Limit how much you have to: 0-1 drink a day for women. 0-2 drinks a day for men. Know how much alcohol  is in your drink. In the [place] one drink equals one 12 oz bottle of beer (355 mL), one 5 oz glass of wine (148 mL), or one 1 oz glass of hard liquor (44 mL). Do not use any products that contain nicotine or tobacco. These products include cigarettes, chewing tobacco, and vaping devices, such as e-cigarettes. If you need help quitting, ask your health care provider. Activity  Follow a regular exercise program to stay fit. This will help you maintain your balance. Ask your health care provider what types of exercise are appropriate for you. If you need a cane or walker, use it as recommended by your health care provider. Wear supportive shoes that have nonskid soles. Safety  Remove any tripping hazards, such as rugs, cords, and clutter. Install safety equipment such as grab bars in bathrooms and safety rails on stairs. Keep rooms and walkways  well-lit. General instructions Talk with your health care provider about your risks for falling. Tell your health care provider if: You fall. Be sure to tell your health care provider about all falls, even ones that seem minor. You feel dizzy, tiredness (fatigue), or off-balance. Take over-the-counter and prescription medicines only as told by your health care provider. These include  supplements. Eat a healthy diet and maintain a healthy weight. A healthy diet includes low-fat dairy products, low-fat (lean) meats, and fiber from whole grains, beans, and lots of fruits and vegetables. Stay current with your vaccines. Schedule regular health, dental, and eye exams. Summary Having a healthy lifestyle and getting preventive care can help to protect your health and wellness after age 70. Screening and testing are the best way to find a health problem early and help you avoid having a fall. Early diagnosis and treatment give you the best chance for managing medical conditions that are more common for people who are older than age 70. Falls are a major cause of broken bones and head injuries in people who are older than age 70. Take precautions to prevent a fall at home. Work with your health care provider to learn what changes you can make to improve your health and wellness and to prevent falls. This information is not intended to replace advice given to you by your health care provider. Make sure you discuss any questions you have with your health care provider. Document Revised: 09/20/2020 Document Reviewed: 09/20/2020 Elsevier Patient Education  2024 [employer].

## 2024-03-31 NOTE — Progress Notes (Signed)
 Subjective:    Patient ID: Veronica Gomez, female    DOB: [birth year], 68 y.o.   MRN: [id number]  Chief Complaint  Patient presents with   Medical Management of Chronic Issues   Pt presents to the office today for CPE.    She had laminectomry and fusion, pedicle screws, iliac fixation on 07/25/23. Reports her pain is a 1 out 10. She is followed by Neurosurgeon.   She is followed by Cardiologists every 6 months for HTN and cardiomyopathy.  Hypertension This is a chronic problem. The current episode started more than 1 year ago. The problem has been resolved since onset. The problem is controlled. Associated symptoms include malaise/fatigue. Pertinent negatives include no peripheral edema or shortness of breath. Risk factors for coronary artery disease include dyslipidemia, obesity and sedentary lifestyle. The current treatment provides moderate improvement.  Gastroesophageal Reflux She complains of belching and heartburn. This is a chronic problem. The current episode started more than 1 year ago. The problem occurs occasionally. The symptoms are aggravated by certain foods. She has tried a PPI for the symptoms. The treatment provided moderate relief.  Hyperlipidemia This is a chronic problem. The current episode started more than 1 year ago. The problem is controlled. Recent lipid tests were reviewed and are normal. Exacerbating diseases include obesity. Pertinent negatives include no shortness of breath. Current antihyperlipidemic treatment includes statins. The current treatment provides moderate improvement of lipids. Risk factors for coronary artery disease include dyslipidemia, hypertension, a sedentary lifestyle, post-menopausal and obesity.  Back Pain This is a chronic problem. The current episode started more than 1 year ago. The problem occurs intermittently. The quality of the pain is described as aching. The pain is at a severity of 1/10. The patient is experiencing no pain. The  symptoms are aggravated by twisting and bending. She has tried bed rest for the symptoms. The treatment provided mild relief.      Review of Systems  Constitutional:  Positive for malaise/fatigue.  Respiratory:  Negative for shortness of breath.   Gastrointestinal:  Positive for heartburn.  Musculoskeletal:  Positive for back pain.  All other systems reviewed and are negative.  Family History  Problem Relation Age of Onset   Osteoporosis Mother    Asthma Mother    Hypertension Mother    Hypertension Father    Heart disease Father        MI   Social History   Socioeconomic History   Marital status: Married    Spouse name: Not on file   Number of children: Not on file   Years of education: Not on file   Highest education level: Not on file  Occupational History   Not on file  Tobacco Use   Smoking status: Former    Current packs/day: 0.00    Types: Cigarettes    Quit date: 05/15/1988    Years since quitting: 35.9   Smokeless tobacco: Never  Vaping Use   Vaping status: Never Used  Substance and Sexual Activity   Alcohol use: Yes    Comment: occasionally - beer   Drug use: No   Sexual activity: Yes    Birth control/protection: Post-menopausal  Other Topics Concern   Not on file  Social History Narrative   Not on file   Social Drivers of Health   Financial Resource Strain: Low Risk  (10/19/2023)   Received from [facility]   Overall Financial Resource Strain (CARDIA)    Difficulty of Paying Living Expenses: Not hard at  all  Food Insecurity: No Food Insecurity (10/19/2023)   Received from [facility]   Hunger Vital Sign    Within the past 12 months, you worried that your food would run out before you got the money to buy more.: Never true    Within the past 12 months, the food you bought just didn't last and you didn't have money to get more.: Never true  Transportation Needs: No Transportation Needs (10/19/2023)   Received from [facility]   PRAPARE -  Transportation    Lack of Transportation (Medical): No    Lack of Transportation (Non-Medical): No  Physical Activity: Inactive (05/04/2023)   Exercise Vital Sign    Days of Exercise per Week: 0 days    Minutes of Exercise per Session: 0 min  Stress: No Stress Concern Present (05/04/2023)   [employer] of Occupational Health - Occupational Stress Questionnaire    Feeling of Stress : Not at all  Social Connections: Socially Integrated (05/04/2023)   Social Connection and Isolation Panel    Frequency of Communication with Friends and Family: More than three times a week    Frequency of Social Gatherings with Friends and Family: Three times a week    Attends Religious Services: More than 4 times per year    Active Member of Clubs or Organizations: Yes    Attends Banker Meetings: More than 4 times per year    Marital Status: Married       Objective:   Physical Exam Vitals reviewed.  Constitutional:      General: She is not in acute distress.    Appearance: She is well-developed.  HENT:     Head: Normocephalic and atraumatic.     Right Ear: Tympanic membrane normal.     Left Ear: Tympanic membrane normal.  Eyes:     Pupils: Pupils are equal, round, and reactive to light.  Neck:     Thyroid : No thyromegaly.  Cardiovascular:     Rate and Rhythm: Normal rate and regular rhythm.     Heart sounds: Normal heart sounds. No murmur heard. Pulmonary:     Effort: Pulmonary effort is normal. No respiratory distress.     Breath sounds: Normal breath sounds. No wheezing.  Abdominal:     General: Bowel sounds are normal. There is no distension.     Palpations: Abdomen is soft.     Tenderness: There is no abdominal tenderness.  Musculoskeletal:        General: No tenderness.     Cervical back: Normal range of motion and neck supple.     Comments: Back brace in place, pain in lumbar and thoracic with flexion and extension  Skin:    General: Skin is warm and dry.   Neurological:     Mental Status: She is alert and oriented to person, place, and time.     Cranial Nerves: No cranial nerve deficit.     Deep Tendon Reflexes: Reflexes are normal and symmetric.  Psychiatric:        Behavior: Behavior normal.        Thought Content: Thought content normal.        Judgment: Judgment normal.       BP 132/79   Pulse 69   Temp 97.8 F (36.6 C) (Temporal)   Ht 5' 3 (1.6 m)   Wt 186 lb 6.4 oz (84.6 kg)   LMP 09/13/2008 (Approximate)   SpO2 99%   BMI 33.02 kg/m  Assessment & Plan:  Exilda Wilhite comes in today with chief complaint of Medical Management of Chronic Issues   Diagnosis and orders addressed:  1. Annual physical exam (Primary) - CMP14+EGFR - CBC with Differential/Platelet - Lipid panel - TSH  2. Obesity (BMI 30-39.9) - CMP14+EGFR - CBC with Differential/Platelet  3. Primary hypertension  - CMP14+EGFR - CBC with Differential/Platelet - TSH  4. Gastroesophageal reflux disease, unspecified whether esophagitis present - CMP14+EGFR - CBC with Differential/Platelet  5. Dyslipidemia  - CMP14+EGFR - CBC with Differential/Platelet - Lipid panel  6. Chronic bilateral low back pain without sciatica - CMP14+EGFR - CBC with Differential/Platelet  7. Cardiomyopathy, ischemic  - CMP14+EGFR - CBC with Differential/Platelet   Labs pending Continue current medications  Keep specialists  Health Maintenance reviewed Diet and exercise encouraged  Follow up plan: 6 months    Bari Learn, FNP

## 2024-04-01 ENCOUNTER — Ambulatory Visit: Payer: Self-pay | Admitting: Family

## 2024-04-01 LAB — CMP14+EGFR
ALT: 12 IU/L (ref 0–32)
AST: 13 IU/L (ref 0–40)
Albumin: 4.4 g/dL (ref 3.9–4.9)
Alkaline Phosphatase: 83 IU/L (ref 49–135)
BUN/Creatinine Ratio: 20 (ref 12–28)
BUN: 21 mg/dL (ref 8–27)
Bilirubin Total: <0.2 mg/dL (ref 0.0–1.2)
CO2: 22 mmol/L (ref 20–29)
Calcium: 9.7 mg/dL (ref 8.7–10.3)
Chloride: 109 mmol/L — ABNORMAL HIGH (ref 96–106)
Creatinine, Ser: 1.04 mg/dL — AB (ref 0.57–1.00)
Globulin, Total: 2 g/dL (ref 1.5–4.5)
Glucose: 72 mg/dL (ref 70–99)
Potassium: 4.7 mmol/L (ref 3.5–5.2)
Sodium: 142 mmol/L (ref 134–144)
Total Protein: 6.4 g/dL (ref 6.0–8.5)
eGFR: 58 mL/min/1.73 — AB (ref >59)

## 2024-04-01 LAB — CBC WITH DIFFERENTIAL/PLATELET
Basophils Absolute: 0.1 x10E3/uL (ref 0.0–0.2)
Basos: 1 %
EOS (ABSOLUTE): 0.3 x10E3/uL (ref 0.0–0.4)
Eos: 6 %
Hematocrit: 38.5 % (ref 34.0–46.6)
Hemoglobin: 12.7 g/dL (ref 11.1–15.9)
Immature Grans (Abs): 0 x10E3/uL (ref 0.0–0.1)
Immature Granulocytes: 0 %
Lymphocytes Absolute: 1.3 x10E3/uL (ref 0.7–3.1)
Lymphs: 29 %
MCH: 33 pg (ref 26.6–33.0)
MCHC: 33 g/dL (ref 31.5–35.7)
MCV: 100 fL — ABNORMAL HIGH (ref 79–97)
Monocytes Absolute: 0.4 x10E3/uL (ref 0.1–0.9)
Monocytes: 8 %
Neutrophils Absolute: 2.5 x10E3/uL (ref 1.4–7.0)
Neutrophils: 56 %
Platelets: 299 x10E3/uL (ref 150–450)
RBC: 3.85 x10E6/uL (ref 3.77–5.28)
RDW: 11.8 % (ref 11.7–15.4)
WBC: 4.5 x10E3/uL (ref 3.4–10.8)

## 2024-04-01 LAB — LIPID PANEL
Cholesterol, Total: 158 mg/dL (ref 100–199)
HDL: 44 mg/dL (ref >39)
LDL CALC COMMENT:: 3.6 ratio (ref 0.0–4.4)
LDL Chol Calc (NIH): 94 mg/dL (ref 0–99)
Triglycerides: 107 mg/dL (ref 0–149)
VLDL Cholesterol Cal: 20 mg/dL (ref 5–40)

## 2024-04-01 LAB — TSH: TSH: 1.38 u[IU]/mL (ref 0.450–4.500)

## 2024-04-03 ENCOUNTER — Encounter: Payer: Self-pay | Admitting: Podiatry

## 2024-04-03 ENCOUNTER — Ambulatory Visit: Admitting: Podiatry

## 2024-04-03 DIAGNOSIS — B351 Tinea unguium: Secondary | ICD-10-CM

## 2024-04-03 DIAGNOSIS — M79675 Pain in left toe(s): Secondary | ICD-10-CM | POA: Diagnosis not present

## 2024-04-03 DIAGNOSIS — M79674 Pain in right toe(s): Secondary | ICD-10-CM | POA: Diagnosis not present

## 2024-04-03 NOTE — Progress Notes (Signed)
 This patient presents to the office with chief complaint of long thick painful nails.  Patient says the nails are painful walking and wearing shoes.  This patient is unable to self treat.  This patient is unable to trim her nails since she is unable to reach her nails.  She presents to the office for preventative foot care services.  General Appearance  Alert, conversant and in no acute stress.  Vascular  Dorsalis pedis and posterior tibial  pulses are palpable  bilaterally.  Capillary return is within normal limits  bilaterally. Temperature is within normal limits  bilaterally.  Neurologic  Senn-Weinstein monofilament wire test within normal limits  bilaterally. Muscle power within normal limits bilaterally.  Nails Thick disfigured discolored nails with subungual debris  from hallux to fifth toes bilaterally. No evidence of bacterial infection or drainage bilaterally.  Orthopedic  No limitations of motion  feet .  No crepitus or effusions noted.  No bony pathology or digital deformities noted.  Skin  normotropic skin with no porokeratosis noted bilaterally.  No signs of infections or ulcers noted.     Onychomycosis  Nails  B/L.  Pain in right toes  Pain in left toes  Debridement of nails both feet followed trimming the nails with dremel tool.  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  Tobie.  RTC 3 months.   Cordella Bold DPM

## 2024-04-15 ENCOUNTER — Other Ambulatory Visit: Payer: Self-pay | Admitting: Family

## 2024-04-15 DIAGNOSIS — K219 Gastro-esophageal reflux disease without esophagitis: Secondary | ICD-10-CM

## 2024-04-20 ENCOUNTER — Other Ambulatory Visit: Payer: Self-pay | Admitting: Family

## 2024-04-20 DIAGNOSIS — E785 Hyperlipidemia, unspecified: Secondary | ICD-10-CM

## 2024-04-29 ENCOUNTER — Other Ambulatory Visit: Payer: Self-pay | Admitting: Family

## 2024-04-29 DIAGNOSIS — I10 Essential (primary) hypertension: Secondary | ICD-10-CM

## 2024-05-11 ENCOUNTER — Other Ambulatory Visit: Payer: Self-pay | Admitting: Internal Medicine

## 2024-05-12 ENCOUNTER — Telehealth: Payer: Self-pay | Admitting: Internal Medicine

## 2024-05-12 NOTE — Telephone Encounter (Signed)
 Patient stated her insurance will change to [facility] in January and wants to know if she will need to get prior approval for her Echocardiogram scheduled on 1/22.  Patient wants a call back to confirm.

## 2024-05-22 ENCOUNTER — Encounter: Payer: Self-pay | Admitting: Internal Medicine

## 2024-05-27 ENCOUNTER — Ambulatory Visit (INDEPENDENT_AMBULATORY_CARE_PROVIDER_SITE_OTHER)

## 2024-05-27 VITALS — BP 128/70 | Ht 63.0 in | Wt 181.0 lb

## 2024-05-27 DIAGNOSIS — Z0000 Encounter for general adult medical examination without abnormal findings: Secondary | ICD-10-CM

## 2024-05-27 NOTE — Patient Instructions (Addendum)
 [name],  Thank you for taking the time for your Medicare Wellness Visit. I appreciate your continued commitment to your health goals. Please review the care plan we discussed, and feel free to reach out if I can assist you further.  Please note that Annual Wellness Visits do not include a physical exam. Some assessments may be limited, especially if the visit was conducted virtually. If needed, we may recommend an in-person follow-up with your provider.  Ongoing Care Seeing your primary care provider every 3 to 6 months helps us  monitor your health and provide consistent, personalized care.   Referrals If a referral was made during today's visit and you haven't received any updates within two weeks, please contact the referred provider directly to check on the status.  Recommended Screenings:  Health Maintenance  Topic Date Due   COVID-19 Vaccine (3 - 2025-26 season) 01/14/2024   Osteoporosis screening with Bone Density Scan  03/31/2025*   Cologuard (Stool DNA test)  03/31/2025*   Medicare Annual Wellness Visit  05/27/2025   Breast Cancer Screening  02/04/2026   DTaP/Tdap/Td vaccine (3 - Td or Tdap) 07/26/2027   Pneumococcal Vaccine for age over 83  Completed   Flu Shot  Completed   Hepatitis C Screening  Completed   Zoster (Shingles) Vaccine  Completed   Meningitis B Vaccine  Aged Out   Colon Cancer Screening  Discontinued  *Topic was postponed. The date shown is not the original due date.       11/07/2023   12:51 PM  Advanced Directives  Does Patient Have a Medical Advance Directive? Yes   Information on Advanced Care Planning can be found at [place]  Secret[employer] of [facility] Advance Health Care Directives Advance Health Care Directives ([email or web address]   Vision: Annual vision screenings are recommended for early detection of glaucoma, cataracts, and diabetic retinopathy. These exams can also reveal signs of chronic conditions such as diabetes and high blood pressure.  Dental:  Annual dental screenings help detect early signs of oral cancer, gum disease, and other conditions linked to overall health, including heart disease and diabetes.  Please see the attached documents for additional preventive care recommendations.

## 2024-05-27 NOTE — Progress Notes (Signed)
 "  Chief Complaint  Patient presents with   Medicare Wellness     Subjective:   Veronica Gomez is a 69 y.o. female who presents for a Medicare Annual Wellness Visit.  Visit info / Clinical Intake: Medicare Wellness Visit Type:: Subsequent Annual Wellness Visit Persons participating in visit and providing information:: patient Medicare Wellness Visit Mode:: In-person (required for WTM) Interpreter Needed?: No Pre-visit prep was completed: yes AWV questionnaire completed by patient prior to visit?: no Living arrangements:: lives with spouse/significant other Patient's Overall Health Status Rating: good Typical amount of pain: some Does pain affect daily life?: no Are you currently prescribed opioids?: no  Dietary Habits and Nutritional Risks How many meals a day?: 3 Eats fruit and vegetables daily?: yes Most meals are obtained by: preparing own meals In the last 2 weeks, have you had any of the following?: none Diabetic:: no  Functional Status Activities of Daily Living (to include ambulation/medication): Independent Ambulation: Independent Medication Administration: Independent Home Management (perform basic housework or laundry): Independent Manage your own finances?: yes Primary transportation is: driving Concerns about vision?: no *vision screening is required for WTM* Concerns about hearing?: no  Fall Screening Falls in the past year?: 0 Number of falls in past year: 0 Was there an injury with Fall?: 0 Fall Risk Category Calculator: 0 Patient Fall Risk Level: Low Fall Risk  Fall Risk Patient at Risk for Falls Due to: Orthopedic patient Fall risk Follow up: Falls prevention discussed; Education provided; Falls evaluation completed  Home and Transportation Safety: All rugs have non-skid backing?: yes All stairs or steps have railings?: yes Grab bars in the bathtub or shower?: yes Have non-skid surface in bathtub or shower?: yes Good home lighting?: yes Regular  seat belt use?: yes Hospital stays in the last year:: (!) yes How many hospital stays:: 1 Reason: surgery  Cognitive Assessment Difficulty concentrating, remembering, or making decisions? : no Will 6CIT or Mini Cog be Completed: no 6CIT or Mini Cog Declined: patient alert, oriented, able to answer questions appropriately and recall recent events  Advance Directives (For Healthcare) Does Patient Have a Medical Advance Directive?: Yes Does patient want to make changes to medical advance directive?: Yes (MAU/Ambulatory/Procedural Areas - Information given) Type of Advance Directive: Living will Copy of Living Will in Chart?: No - copy requested  Reviewed/Updated  Reviewed/Updated: Reviewed All (Medical, Surgical, Family, Medications, Allergies, Care Teams, Patient Goals)    Allergies (verified) Farxiga  [dapagliflozin ] and Jardiance  [empagliflozin ]   Current Medications (verified) Outpatient Encounter Medications as of 05/27/2024  Medication Sig   ascorbic acid (VITAMIN C) 500 MG tablet Take 500 mg by mouth.   aspirin 81 MG tablet Take 81 mg by mouth daily.   atorvastatin  (LIPITOR) 40 MG tablet TAKE 1 TABLET BY MOUTH EVERY DAY   Calcium  Carb-Cholecalciferol (OYSTER SHELL CALCIUM  250+D) 250-3.125 MG-MCG TABS Take 1 tablet by mouth.   carvedilol  (COREG ) 6.25 MG tablet TAKE 1 TABLET BY MOUTH 2 TIMES DAILY WITH A MEAL.   Cholecalciferol (VITAMIN D3) 25 MCG (1000 UT) CAPS Take by mouth daily.   cyanocobalamin (VITAMIN B12) 1000 MCG tablet Take 3,000 mcg by mouth daily.   diclofenac  (VOLTAREN ) 75 MG EC tablet TAKE 1 TABLET BY MOUTH TWICE  DAILY   diphenhydrAMINE (BENADRYL) 25 mg capsule Take 25 mg by mouth.   ENTRESTO  97-103 MG TAKE 1 TABLET BY MOUTH TWICE  DAILY   Melatonin-Pyridoxine (MELATIN PO) Take by mouth.   omeprazole  (PRILOSEC) 20 MG capsule TAKE 1 CAPSULE BY MOUTH EVERY  DAY   spironolactone  (ALDACTONE ) 25 MG tablet Take 0.5 tablets (12.5 mg total) by mouth daily.   terbinafine   (LAMISIL ) 250 MG tablet Take 1 tablet (250 mg total) by mouth daily.   No facility-administered encounter medications on file as of 05/27/2024.    History: Past Medical History:  Diagnosis Date   CAD (coronary artery disease)    Cardiomyopathy (HCC)    GERD (gastroesophageal reflux disease)    Hyperlipidemia    Hypertension    Ischemia    Left bundle branch block (LBBB)    Past Surgical History:  Procedure Laterality Date   BACK SURGERY  07/25/2023   CERVICAL POLYPECTOMY  05/15/2008   FINGER SURGERY  05/15/2006   SHOULDER SURGERY Right 05/15/2012   TUBAL LIGATION  05/15/1986   Family History  Problem Relation Age of Onset   Osteoporosis Mother    Asthma Mother    Hypertension Mother    Hypertension Father    Heart disease Father        MI   Colon cancer Neg Hx    Social History   Occupational History   Not on file  Tobacco Use   Smoking status: Former    Current packs/day: 0.00    Types: Cigarettes    Quit date: 05/15/1988    Years since quitting: 36.0   Smokeless tobacco: Never  Vaping Use   Vaping status: Never Used  Substance and Sexual Activity   Alcohol use: Yes    Comment: occasionally - beer   Drug use: No   Sexual activity: Yes    Birth control/protection: Post-menopausal   Tobacco Counseling Counseling given: Not Answered  SDOH Screenings   Food Insecurity: No Food Insecurity (05/27/2024)  Housing: Low Risk (05/27/2024)  Transportation Needs: No Transportation Needs (05/27/2024)  Utilities: Not At Risk (05/27/2024)  Alcohol Screen: Low Risk (05/04/2023)  Depression (PHQ2-9): Low Risk (05/27/2024)  Financial Resource Strain: Low Risk (10/19/2023)   Received from [facility]  Physical Activity: Insufficiently Active (05/27/2024)  Social Connections: Socially Integrated (05/27/2024)  Stress: No Stress Concern Present (05/27/2024)  Tobacco Use: Medium Risk (05/27/2024)  Health Literacy: Adequate Health Literacy (05/27/2024)   See flowsheets for full  screening details  Depression Screen PHQ 2 & 9 Depression Scale- Over the past 2 weeks, how often have you been bothered by any of the following problems? Little interest or pleasure in doing things: 0 Feeling down, depressed, or hopeless (PHQ Adolescent also includes...irritable): 0 PHQ-2 Total Score: 0 Trouble falling or staying asleep, or sleeping too much: 0 Feeling tired or having little energy: 0 Poor appetite or overeating (PHQ Adolescent also includes...weight loss): 0 Feeling bad about yourself - or that you are a failure or have let yourself or your family down: 0 Trouble concentrating on things, such as reading the newspaper or watching television (PHQ Adolescent also includes...like school work): 0 Moving or speaking so slowly that other people could have noticed. Or the opposite - being so fidgety or restless that you have been moving around a lot more than usual: 0 Thoughts that you would be better off dead, or of hurting yourself in some way: 0 PHQ-9 Total Score: 0 If you checked off any problems, how difficult have these problems made it for you to do your work, take care of things at home, or get along with other people?: Not difficult at all     Goals Addressed             This Visit's Progress  Remain active and independent   On track            Objective:    Today's Vitals   05/27/24 0955  BP: 128/70  Weight: 181 lb (82.1 kg)  Height: 5' 3 (1.6 m)   Body mass index is 32.06 kg/m.  Hearing/Vision screen Hearing Screening - Comments:: Patient is able to hear conversational tones without difficulty. No issues reported.   Vision Screening - Comments:: Wears rx glasses - up to date with routine eye exams with [facility]  Immunizations and Health Maintenance Health Maintenance  Topic Date Due   COVID-19 Vaccine (3 - 2025-26 season) 01/14/2024   Bone Density Scan  03/31/2025 (Originally 01/27/2024)   Fecal DNA (Cologuard)  03/31/2025  (Originally 07/03/1998)   Medicare Annual Wellness (AWV)  05/27/2025   Mammogram  02/04/2026   DTaP/Tdap/Td (3 - Td or Tdap) 07/26/2027   Pneumococcal Vaccine: 50+ Years  Completed   Influenza Vaccine  Completed   Hepatitis C Screening  Completed   Zoster Vaccines- Shingrix   Completed   Meningococcal B Vaccine  Aged Out   Colonoscopy  Discontinued        Assessment/Plan:  This is a routine wellness examination for Blakely.  Patient Care Team: Lavell Bari LABOR, FNP as PCP - General (Family Medicine) Mona Vinie BROCKS, MD as PCP - Cardiology (Cardiology) Dannielle Bouchard, DO as Consulting Physician (Obstetrics and Gynecology) [facility], Katelyn Rose, PA-C as Nurse Practitioner (Neurosurgery) Onesimo Oneil LABOR, MD as Consulting Physician (Orthopedic Surgery) Loreda Hacker, DPM as Consulting Physician (Podiatry) Vicci Mcardle, OD (Optometry)  I have personally reviewed and noted the following in the patients chart:   Medical and social history Use of alcohol, tobacco or illicit drugs  Current medications and supplements including opioid prescriptions. Functional ability and status Nutritional status Physical activity Advanced directives List of other physicians Hospitalizations, surgeries, and ER visits in previous 12 months Vitals Screenings to include cognitive, depression, and falls Referrals and appointments  No orders of the defined types were placed in this encounter.  In addition, I have reviewed and discussed with patient certain preventive protocols, quality metrics, and best practice recommendations. A written personalized care plan for preventive services as well as general preventive health recommendations were provided to patient.   Lavelle Charmaine Browner, LPN   8/86/7973   Return in 1 year (on 05/27/2025).  After Visit Summary: (In Person-Declined) Patient declined AVS at this time.  Nurse Notes: No voiced or noted concerns at this time  "

## 2024-06-05 ENCOUNTER — Ambulatory Visit (HOSPITAL_COMMUNITY)
Admission: RE | Admit: 2024-06-05 | Discharge: 2024-06-05 | Disposition: A | Discharge status: Home / Self Care | Source: Ambulatory Visit | Attending: Internal Medicine | Admitting: Internal Medicine

## 2024-06-05 DIAGNOSIS — I5022 Chronic systolic (congestive) heart failure: Secondary | ICD-10-CM | POA: Insufficient documentation

## 2024-06-05 LAB — ECHOCARDIOGRAM COMPLETE
Area-P 1/2: 3.22 cm2
P 1/2 time: 501 ms
S' Lateral: 3.7 cm

## 2024-06-05 MED ORDER — PERFLUTREN LIPID MICROSPHERE
1.0000 mL | INTRAVENOUS | Status: AC | PRN
  Administered 2024-06-05: 2 mL via INTRAVENOUS

## 2024-06-10 ENCOUNTER — Encounter: Payer: Self-pay | Admitting: Internal Medicine

## 2024-06-18 ENCOUNTER — Encounter: Payer: Self-pay | Admitting: Orthopedic Surgery

## 2024-06-18 ENCOUNTER — Ambulatory Visit: Admitting: Orthopedic Surgery

## 2024-06-18 ENCOUNTER — Other Ambulatory Visit: Payer: Self-pay

## 2024-06-18 DIAGNOSIS — M1812 Unilateral primary osteoarthritis of first carpometacarpal joint, left hand: Secondary | ICD-10-CM

## 2024-06-18 DIAGNOSIS — M1712 Unilateral primary osteoarthritis, left knee: Secondary | ICD-10-CM

## 2024-06-18 DIAGNOSIS — M79642 Pain in left hand: Secondary | ICD-10-CM

## 2024-06-18 NOTE — Progress Notes (Signed)
 Orthopaedic Clinic Return  Assessment & Plan: Veronica Gomez is a 69 y.o. female with the following: 1.  Left first CMC arthritis 2.  Left knee arthritis Assessment & Plan Osteoarthritis of left knee Chronic, advanced osteoarthritis with symptoms and mild swelling, recently aggravated by a fall but currently settled down. Non-surgical management remains appropriate. Arthroplasty reserved for intolerable symptoms significantly impacting daily function. - Discussed symptom management options including repeat corticosteroid injections, hyaluronic acid injections, knee bracing, topical agents, and NSAIDs. - Agreed to pursue insurance authorization for hyaluronic acid injections as the next step. - Provided anticipatory guidance regarding injection timeline and expected outcomes. - Discussed knee brace options; advised she may obtain one from a pharmacy if desired.  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 Symptoms are intermittent and currently mild, recently aggravated by a fall but now resolved. Conservative management is appropriate unless symptoms progress. - Discussed management options including thumb bracing, activity modification, and corticosteroid injections if symptoms worsen. - Advised no intervention is necessary at this time given mild symptoms, but options are available if symptoms progress.       Follow-up: Return for After Insurance Authorization for Injection.   Subjective:  Chief Complaint  Patient presents with   Knee Pain    L    Hand Pain    L nodule for yrs getting worse      Discussed the use of AI scribe software for clinical note transcription with the patient, who gave verbal consent to proceed.  History of Present Illness Veronica Gomez is a 69 year old female with advanced osteoarthritis of the left knee and left thumb carpometacarpal joint who presents for evaluation of symptom aggravation  following a recent fall.  Left knee: She has chronic left knee pain from advanced osteoarthritis, recently worsened after a fall landing on both knees. She notes increased swelling and a softer feel of the left knee compared with the right, with no right knee symptoms. A prior steroid injection gave minimal relief. She currently uses oral diclofenac , ice, and topical anti-inflammatory treatments. She has not tried viscosupplementation or a knee brace. She is concerned that altered gait from knee pain is affecting her back.  Left thumb/hand: She has chronic left thumb and hand pain from [facility] osteoarthritis with intermittent flares, currently mild. Pain increased when she braced her fall with the left hand and she feels persistent dysfunction in the left hand compared with the right. She has not used a brace or had recent injections for the thumb. She had an injection in a different finger years ago. She has no right hand symptoms.    Review of Systems: No fevers or chills No numbness or tingling No chest pain No shortness of breath No bowel or bladder dysfunction No GI distress No headaches   Objective: LMP 09/13/2008   Physical Exam:  Physical Exam MUSCULOSKELETAL: Hyperextension at left thumb joint with straight hand alignment. Left knee more swollen than right, aggravated by fall.  Positive CMC grind.  Deformity about the first [facility] joint   Evaluation left knee demonstrates no deformity.  Tenderness to palpation along the medial joint line.  She has good range of motion.  laxity varus valgus stress.  Negative Lachman.    IMAGING: I personally ordered and reviewed the following images:   X-rays of the left hand were obtained in clinic today.  No acute injuries.  No dislocation.  There is severe first CMC arthritis, including subchondral cysts and osteophytes.  Hyperextension  of the IP joint.  Impression: Left hand x-ray with severe first [facility] arthritis    Portions of this note  were completed with dictation software and mistakes or typos may exist.   Oneil DELENA Horde, MD 06/18/2024 10:43 AM

## 2024-06-25 ENCOUNTER — Ambulatory Visit: Admitting: Podiatry

## 2024-06-30 ENCOUNTER — Ambulatory Visit: Admitting: Internal Medicine

## 2024-07-04 ENCOUNTER — Ambulatory Visit: Admitting: Podiatry

## 2024-10-07 ENCOUNTER — Ambulatory Visit: Admitting: Family
